# Patient Record
Sex: Male | Born: 1975 | Race: White | Hispanic: No | Marital: Married | Smoking: Current every day smoker
Health system: Southern US, Community
[De-identification: ages and names within clinical notes are randomized; demographics above are authoritative.]

## PROBLEM LIST (undated history)

## (undated) DIAGNOSIS — I350 Nonrheumatic aortic (valve) stenosis: Secondary | ICD-10-CM

## (undated) DIAGNOSIS — F419 Anxiety disorder, unspecified: Secondary | ICD-10-CM

## (undated) DIAGNOSIS — K859 Acute pancreatitis without necrosis or infection, unspecified: Secondary | ICD-10-CM

## (undated) DIAGNOSIS — R011 Cardiac murmur, unspecified: Secondary | ICD-10-CM

## (undated) HISTORY — PX: FINGER SURGERY: SHX640

## (undated) HISTORY — PX: CARDIAC SURGERY: SHX584

## (undated) HISTORY — PX: AORTIC VALVE REPAIR: SHX6306

## (undated) HISTORY — PX: MECHANICAL AORTIC VALVE REPLACEMENT: SHX2013

## (undated) HISTORY — DX: Acute pancreatitis without necrosis or infection, unspecified: K85.90

## (undated) HISTORY — DX: Anxiety disorder, unspecified: F41.9

---

## 2006-09-18 ENCOUNTER — Emergency Department: Payer: Self-pay | Admitting: Unknown Physician Specialty

## 2008-04-19 ENCOUNTER — Emergency Department: Payer: Self-pay | Admitting: Emergency Medicine

## 2008-05-22 ENCOUNTER — Emergency Department: Payer: Self-pay | Admitting: Emergency Medicine

## 2008-06-13 ENCOUNTER — Emergency Department: Payer: Self-pay | Admitting: Emergency Medicine

## 2009-06-29 ENCOUNTER — Emergency Department: Payer: Self-pay | Admitting: Emergency Medicine

## 2012-01-07 ENCOUNTER — Emergency Department: Payer: Self-pay | Admitting: Emergency Medicine

## 2012-01-07 LAB — URINALYSIS, COMPLETE
Nitrite: NEGATIVE
Protein: 30
RBC,UR: 2334 /HPF (ref 0–5)
Specific Gravity: 1.021 (ref 1.003–1.030)
WBC UR: 2 /HPF (ref 0–5)

## 2012-01-07 LAB — CBC WITH DIFFERENTIAL/PLATELET
Basophil #: 0.1 10*3/uL (ref 0.0–0.1)
Eosinophil #: 0.3 10*3/uL (ref 0.0–0.7)
Lymphocyte #: 2.9 10*3/uL (ref 1.0–3.6)
Lymphocyte %: 25.5 %
MCH: 32 pg (ref 26.0–34.0)
MCHC: 34.8 g/dL (ref 32.0–36.0)
MCV: 92 fL (ref 80–100)
Monocyte #: 0.8 x10 3/mm (ref 0.2–1.0)
Platelet: 233 10*3/uL (ref 150–440)
RDW: 13.2 % (ref 11.5–14.5)
WBC: 11.5 10*3/uL — ABNORMAL HIGH (ref 3.8–10.6)

## 2012-01-07 LAB — COMPREHENSIVE METABOLIC PANEL
Albumin: 4.2 g/dL (ref 3.4–5.0)
Alkaline Phosphatase: 120 U/L (ref 50–136)
Anion Gap: 9 (ref 7–16)
BUN: 15 mg/dL (ref 7–18)
Calcium, Total: 9.3 mg/dL (ref 8.5–10.1)
Chloride: 106 mmol/L (ref 98–107)
Co2: 24 mmol/L (ref 21–32)
Creatinine: 1.07 mg/dL (ref 0.60–1.30)
EGFR (African American): >60
Glucose: 148 mg/dL — ABNORMAL HIGH (ref 65–99)
Osmolality: 281 (ref 275–301)
SGPT (ALT): 30 U/L (ref 12–78)
Sodium: 139 mmol/L (ref 136–145)
Total Protein: 7.8 g/dL (ref 6.4–8.2)

## 2013-01-01 ENCOUNTER — Emergency Department: Payer: Self-pay | Admitting: Emergency Medicine

## 2013-01-01 LAB — BASIC METABOLIC PANEL
Anion Gap: 4 — ABNORMAL LOW (ref 7–16)
BUN: 13 mg/dL (ref 7–18)
Calcium, Total: 8.9 mg/dL (ref 8.5–10.1)
Chloride: 106 mmol/L (ref 98–107)
Co2: 27 mmol/L (ref 21–32)
EGFR (African American): >60
EGFR (Non-African Amer.): >60
Glucose: 97 mg/dL (ref 65–99)
Osmolality: 274 (ref 275–301)
Potassium: 4.2 mmol/L (ref 3.5–5.1)

## 2013-01-01 LAB — CBC
HCT: 43.7 % (ref 40.0–52.0)
HGB: 15.1 g/dL (ref 13.0–18.0)
Platelet: 176 10*3/uL (ref 150–440)
RBC: 4.76 10*6/uL (ref 4.40–5.90)
RDW: 14 % (ref 11.5–14.5)

## 2013-04-17 ENCOUNTER — Emergency Department: Payer: Self-pay | Admitting: Emergency Medicine

## 2013-04-28 ENCOUNTER — Emergency Department: Payer: Self-pay | Admitting: Emergency Medicine

## 2013-05-03 ENCOUNTER — Emergency Department: Payer: Self-pay | Admitting: Emergency Medicine

## 2013-05-04 ENCOUNTER — Ambulatory Visit: Payer: Self-pay | Admitting: Orthopedic Surgery

## 2013-05-04 LAB — DRUG SCREEN, URINE
AMPHETAMINES, UR SCREEN: NEGATIVE (ref ?–1000)
BARBITURATES, UR SCREEN: POSITIVE (ref ?–200)
BENZODIAZEPINE, UR SCRN: NEGATIVE (ref ?–200)
Cannabinoid 50 Ng, Ur ~~LOC~~: POSITIVE (ref ?–50)
Cocaine Metabolite,Ur ~~LOC~~: NEGATIVE (ref ?–300)
MDMA (Ecstasy)Ur Screen: NEGATIVE (ref ?–500)
METHADONE, UR SCREEN: NEGATIVE (ref ?–300)
Opiate, Ur Screen: POSITIVE (ref ?–300)
Phencyclidine (PCP) Ur S: NEGATIVE (ref ?–25)
Tricyclic, Ur Screen: NEGATIVE (ref ?–1000)

## 2014-03-31 ENCOUNTER — Emergency Department: Payer: Self-pay | Admitting: Student

## 2014-04-01 ENCOUNTER — Emergency Department: Payer: Self-pay | Admitting: Emergency Medicine

## 2014-05-24 ENCOUNTER — Emergency Department: Payer: Self-pay | Admitting: Emergency Medicine

## 2014-08-07 NOTE — Op Note (Signed)
PATIENT NAME:  Scott PeerAYLOR, Jermayne MR#:  [id number] DATE OF BIRTH:  24-Mar-1976  DATE OF PROCEDURE:  05/04/2013  PREOPERATIVE DIAGNOSIS: Right proximal fourth metacarpal fracture, displaced right fifth carpometacarpal dislocation.   POSTOPERATIVE DIAGNOSIS: Right proximal fourth metacarpal fracture, displaced right fifth carpometacarpal dislocation.  PROCEDURE: Open reduction, internal fixation, fourth metacarpal; closed reduction, pinning, fifth carpometacarpal joint.   ANESTHESIA: General.   SURGEON: Kennedy BuckerMichael Orie Cuttino, M.D.   DESCRIPTION OF PROCEDURE: The patient was brought to the Operating Room and, after adequate anesthesia was obtained, the right arm was prepped and draped in the usual sterile fashion. After patient identification and timeout procedures were completed, the tourniquet was raised to 250 mmHg. An incision was made between the fourth and fifth metacarpals and slightly proximal to them. The subcutaneous tissue was spread, with hemostasis being achieved with electrocautery. The fracture was exposed and, with longitudinal traction to the fifth finger, the [facility] of the fifth joint was reduced. This aided in getting reduction of the fourth metacarpal.   With the fourth metacarpal fracture site exposed, a reduction clamp was applied and a five hole 1.5 mm plate from the Biomet hand fracture set was obtained, cut to five holes and applied to the dorsal aspect of the fourth metacarpal. The screw holes were filled after contouring the plate with  standard technique, drilling 1.1 mm, measuring and placing locking cortical screws. C-arm was brought in, and it appeared to be an anatomic alignment. Rotation alignment also appeared appropriate and anatomic.   Next, the fifth CMC joint was stressed under fluoroscopy and was noted to be subluxable posteriorly. Because of this, a K wire was deemed necessary, and a K wire was inserted through the base of the fifth metacarpal, into the carpus, with the  proximal end of the phalanx directed volarly. This pin was cut short and bent over. Xeroform, 4 x 4's, Webril and a volar splint were applied. Prior to this 20 mL of 0.5% Sensorcaine were infiltrated in the area of the incision and the wound was closed with 4-0 Vicryl subcutaneously, 4-0 nylon for the skin in simple interrupted manner.   ESTIMATED BLOOD LOSS: Minimal.   COMPLICATIONS: None.   SPECIMEN: None.   IMPLANT: Biomet 1.5 mm locking plate and screws.   TOURNIQUET TIME: Approximately 45 minutes at 250 mmHg.    ____________________________ Leitha SchullerMichael J. Susen Haskew, MD mjm:cg D: 05/04/2013 17:09:19 ET T: 05/05/2013 05:42:19 ET JOB#: [id number]  cc: Leitha SchullerMichael J. Aviana Shevlin, MD, <Dictator> Leitha SchullerMICHAEL J Kaizen Ibsen MD ELECTRONICALLY SIGNED 05/05/2013 9:04

## 2014-11-12 ENCOUNTER — Emergency Department
Admission: EM | Admit: 2014-11-12 | Discharge: 2014-11-12 | Disposition: A | Discharge status: Home / Self Care | Payer: Self-pay | Attending: Emergency Medicine | Admitting: Emergency Medicine

## 2014-11-12 DIAGNOSIS — S0083XA Contusion of other part of head, initial encounter: Secondary | ICD-10-CM

## 2014-11-12 DIAGNOSIS — Z87891 Personal history of nicotine dependence: Secondary | ICD-10-CM | POA: Insufficient documentation

## 2014-11-12 DIAGNOSIS — W01198A Fall on same level from slipping, tripping and stumbling with subsequent striking against other object, initial encounter: Secondary | ICD-10-CM | POA: Insufficient documentation

## 2014-11-12 DIAGNOSIS — Y9289 Other specified places as the place of occurrence of the external cause: Secondary | ICD-10-CM | POA: Insufficient documentation

## 2014-11-12 DIAGNOSIS — S0181XA Laceration without foreign body of other part of head, initial encounter: Secondary | ICD-10-CM | POA: Insufficient documentation

## 2014-11-12 DIAGNOSIS — Y9389 Activity, other specified: Secondary | ICD-10-CM | POA: Insufficient documentation

## 2014-11-12 DIAGNOSIS — Y998 Other external cause status: Secondary | ICD-10-CM | POA: Insufficient documentation

## 2014-11-12 HISTORY — DX: Cardiac murmur, unspecified: R01.1

## 2014-11-12 HISTORY — DX: Nonrheumatic aortic (valve) stenosis: I35.0

## 2014-11-12 MED ORDER — BACITRACIN ZINC 500 UNIT/GM EX OINT
1.0000 "application " | TOPICAL_OINTMENT | Freq: Two times a day (BID) | CUTANEOUS | Status: DC
  Administered 2014-11-12: 1 via TOPICAL

## 2014-11-12 MED ORDER — LIDOCAINE-EPINEPHRINE (PF) 1 %-1:200000 IJ SOLN
INTRAMUSCULAR | Status: AC
  Administered 2014-11-12: 30 mL via INTRADERMAL
  Filled 2014-11-12: qty 30

## 2014-11-12 MED ORDER — BACITRACIN ZINC 500 UNIT/GM EX OINT
TOPICAL_OINTMENT | CUTANEOUS | Status: AC
  Administered 2014-11-12: 1 via TOPICAL
  Filled 2014-11-12: qty 0.9

## 2014-11-12 MED ORDER — LIDOCAINE-EPINEPHRINE (PF) 1 %-1:200000 IJ SOLN
30.0000 mL | Freq: Once | INTRAMUSCULAR | Status: AC
  Administered 2014-11-12: 30 mL via INTRADERMAL

## 2014-11-12 NOTE — ED Notes (Addendum)
Pt states lamp fell over about an hour ago. Lamp was tall approx 6 ft and had frosted glass the fell onto top of head. Head lac and right cheek lac noted. Bleeding controlled.  Head pain 6/10. No LOC. Reports blurry vision also.

## 2014-11-12 NOTE — Discharge Instructions (Signed)
Facial Laceration A facial laceration is a cut on the face. These injuries can be painful and cause bleeding. Some cuts may need to be closed with stitches (sutures), skin adhesive strips, or wound glue. Cuts usually heal quickly but can leave a scar. It can take 1-2 years for the scar to go away completely. HOME CARE   Only take medicines as told by your doctor.  Follow your doctor's instructions for wound care. For Stitches:  Keep the cut clean and dry.  If you have a bandage (dressing), change it at least once a day. Change the bandage if it gets wet or dirty, or as told by your doctor.  Wash the cut with soap and water 2 times a day. Rinse the cut with water. Pat it dry with a clean towel.  Put a thin layer of medicated cream on the cut as told by your doctor.  You may shower after the first 24 hours. Do not soak the cut in water until the stitches are removed.  Have your stitches removed as told by your doctor.  Do not wear any makeup until a few days after your stitches are removed. For Skin Adhesive Strips:  Keep the cut clean and dry.  Do not get the strips wet. You may take a bath, but be careful to keep the cut dry.  If the cut gets wet, pat it dry with a clean towel.  The strips will fall off on their own. Do not remove the strips that are still stuck to the cut. For Wound Glue:  You may shower or take baths. Do not soak or scrub the cut. Do not swim. Avoid heavy sweating until the glue falls off on its own. After a shower or bath, pat the cut dry with a clean towel.  Do not put medicine or makeup on your cut until the glue falls off.  If you have a bandage, do not put tape over the glue.  Avoid lots of sunlight or tanning lamps until the glue falls off.  The glue will fall off on its own in 5-10 days. Do not pick at the glue. After Healing: Put sunscreen on the cut for the first year to reduce your scar. GET HELP RIGHT AWAY IF:   Your cut area gets red,  painful, or puffy (swollen).  You see a yellowish-white fluid (pus) coming from the cut.  You have chills or a fever. MAKE SURE YOU:   Understand these instructions.  Will watch your condition.  Will get help right away if you are not doing well or get worse. Document Released: 09/19/2007 Document Revised: 01/21/2013 Document Reviewed: 11/13/2012 [facility] Patient Information 2015 [place], [place]. This information is not intended to replace advice given to you by your health care provider. Make sure you discuss any questions you have with your health care provider.   Keep the wounds, clean, dry, and covered as needed.  Return in 3-5 days for suture removal.

## 2014-11-12 NOTE — ED Notes (Signed)
Instructed on suture care and to come back in 5 days for removal. Pt verbalized understanding

## 2014-11-12 NOTE — ED Provider Notes (Signed)
[facility] Emergency Department Provider Note ____________________________________________  Time seen: 1155  I have reviewed the triage vital signs and the nursing notes.  HISTORY  Chief Complaint  Head Injury  HPI Scott [name] is a 39 y.o. male reports to the ED for evaluation of injury sustained about a half ago at home. He describes that the kids knocked over a tall floor lamp, which had a frosted glass shade on it. The lamp shattered as it fell hitting the patient on the forehead, causing laceration to the top of the forehead and a superficial laceration to the lower right cheek. He denies any loss of consciousness, nausea, vomiting, or headache. He reports some resolved blurred vision. He rates his pain as 6/10 in triage.  Past Medical History  Diagnosis Date  . Aortic stenosis   . Murmur, cardiac    There are no active problems to display for this patient.  Past Surgical History  Procedure Laterality Date  . Finger surgery    . Cardiac surgery     No current outpatient prescriptions on file.  Allergies Cocoa  History reviewed. No pertinent family history.  Social History History  Substance Use Topics  . Smoking status: Former Games developer  . Smokeless tobacco: Not on file  . Alcohol Use: Yes   Review of Systems  Constitutional: Negative for fever. Eyes: Negative for visual changes. ENT: Negative for sore throat. Cardiovascular: Negative for chest pain. Respiratory: Negative for shortness of breath. Gastrointestinal: Negative for abdominal pain, vomiting and diarrhea. Genitourinary: Negative for dysuria. Musculoskeletal: Negative for back pain. Skin: Negative for rash. Neurological: Negative for headaches, focal weakness or numbness. ____________________________________________  PHYSICAL EXAM:  VITAL SIGNS: ED Triage Vitals  Enc Vitals Group     BP 11/12/14 1128 104/86 mmHg     Pulse Rate 11/12/14 1128 64     Resp 11/12/14 1128 14      Temp 11/12/14 1128 98.1 F (36.7 C)     Temp Source 11/12/14 1128 Oral     SpO2 11/12/14 1128 99 %     Weight 11/12/14 1128 175 lb (79.379 kg)     Height 11/12/14 1128 5\' 10"  (1.778 m)     Head Cir --      Peak Flow --      Pain Score 11/12/14 1128 6     Pain Loc --      Pain Edu? --      Excl. in GC? --    Constitutional: Alert and oriented. Well appearing and in no distress. Eyes: Conjunctivae are normal. PERRL. Normal extraocular movements. ENT   Head: Normocephalic and atraumatic, except for linear laceration to the forehead and right cheek.    Nose: No congestion/rhinnorhea.   Mouth/Throat: Mucous membranes are moist.   Neck: Supple. No thyromegaly. Hematological/Lymphatic/Immunilogical: No cervical lymphadenopathy. Cardiovascular: Normal rate, regular rhythm.  Respiratory: Normal respiratory effort.  Musculoskeletal: Nontender with normal range of motion in all extremities.  Neurologic:  Normal gait without ataxia. Normal speech and language. No gross focal neurologic deficits are appreciated. Skin:  Skin is warm, dry and intact. No rash noted. Psychiatric: Mood and affect are normal. Patient exhibits appropriate insight and judgment. ____________________________________________  LACERATION REPAIR Performed by: Lissa Hoard Authorized by: Lissa Hoard Consent: Verbal consent obtained. Risks and benefits: risks, benefits and alternatives were discussed Consent given by: patient Patient identity confirmed: provided demographic data Prepped and Draped in normal sterile fashion Wound explored  Laceration Location: forehead  Laceration  Length: 2 cm  No Foreign Bodies seen or palpated  Anesthesia: local infiltration  Local anesthetic: lidocaine 1% w/ epinephrine  Anesthetic total: 2 ml  Irrigation method: syringe Amount of cleaning: standard  Skin closure: 6-0 Nylon  Number of sutures: 5  Technique:  interrupted  Patient tolerance: Patient tolerated the procedure well with no immediate complications. ____________________________________________  INITIAL IMPRESSION / ASSESSMENT AND PLAN / ED COURSE  Facial contusion with laceration s/p suture repair. Superficial left cheek laceration without repair.  ____________________________________________  FINAL CLINICAL IMPRESSION(S) / ED DIAGNOSES  Final diagnoses:  Facial laceration, initial encounter  Facial contusion, initial encounter     Lissa Hoard, PA-C 11/12/14 1445

## 2014-11-12 NOTE — ED Notes (Signed)
Pt reports to ED w/ c/o of head injury.  Pt sts his kids knocked down a lamp that hit him on his head.  Pt describes loss of vision but denies LOC.  Pt sts vision is blurry.  Bleeding is controled

## 2015-03-12 ENCOUNTER — Encounter: Payer: Self-pay | Admitting: Medical Oncology

## 2015-03-12 ENCOUNTER — Emergency Department
Admission: EM | Admit: 2015-03-12 | Discharge: 2015-03-12 | Disposition: A | Discharge status: Home / Self Care | Payer: Self-pay | Attending: Emergency Medicine | Admitting: Emergency Medicine

## 2015-03-12 DIAGNOSIS — R110 Nausea: Secondary | ICD-10-CM | POA: Insufficient documentation

## 2015-03-12 DIAGNOSIS — F17200 Nicotine dependence, unspecified, uncomplicated: Secondary | ICD-10-CM | POA: Insufficient documentation

## 2015-03-12 DIAGNOSIS — R51 Headache: Secondary | ICD-10-CM | POA: Insufficient documentation

## 2015-03-12 DIAGNOSIS — R519 Headache, unspecified: Secondary | ICD-10-CM

## 2015-03-12 MED ORDER — DIPHENHYDRAMINE HCL 50 MG/ML IJ SOLN
50.0000 mg | Freq: Once | INTRAMUSCULAR | Status: AC
  Administered 2015-03-12: 50 mg via INTRAMUSCULAR
  Filled 2015-03-12: qty 1

## 2015-03-12 MED ORDER — PROMETHAZINE HCL 25 MG/ML IJ SOLN
25.0000 mg | Freq: Once | INTRAMUSCULAR | Status: AC
  Administered 2015-03-12: 25 mg via INTRAMUSCULAR
  Filled 2015-03-12: qty 1

## 2015-03-12 MED ORDER — KETOROLAC TROMETHAMINE 30 MG/ML IJ SOLN
60.0000 mg | Freq: Once | INTRAMUSCULAR | Status: AC
  Administered 2015-03-12: 60 mg via INTRAMUSCULAR
  Filled 2015-03-12: qty 2

## 2015-03-12 NOTE — ED Provider Notes (Signed)
[facility] Emergency Department Provider Note  ____________________________________________  Time seen: Approximately 1:01 PM  I have reviewed the triage vital signs and the nursing notes.   HISTORY  Chief Complaint Headache    HPI Scott [name] is a 39 y.o. male who presents to the emergency department complaining of one-week history of headache. He states that he is tried numerous over-the-counter medications to include Tylenol, ibuprofen, and Imitrex. He states that his headache will all however it does not completely resolve. He denies any visual acuity changes. He denies any numbness or tingling. He does endorse some mild nausea but no vomiting. He states the pain is constant however it waxes and wanes. Pain is an ache/pressure/pounding sensation.   Past Medical History  Diagnosis Date  . Aortic stenosis   . Murmur, cardiac     There are no active problems to display for this patient.   Past Surgical History  Procedure Laterality Date  . Finger surgery    . Cardiac surgery      No current outpatient prescriptions on file.  Allergies Cocoa  No family history on file.  Social History Social History  Substance Use Topics  . Smoking status: Current Every Day Smoker  . Smokeless tobacco: None  . Alcohol Use: Yes     Comment: occ    Review of Systems Constitutional: No fever/chills Eyes: No visual changes. ENT: No sore throat. Cardiovascular: Denies chest pain. Respiratory: Denies shortness of breath. Gastrointestinal: No abdominal pain.  No nausea, no vomiting.  No diarrhea.  No constipation. Genitourinary: Negative for dysuria. Musculoskeletal: Negative for back pain. Skin: Negative for rash. Neurological: Endorses a frontal headache but denies focal weakness or numbness.  10-point ROS otherwise negative.  ____________________________________________   PHYSICAL EXAM:  VITAL SIGNS: ED Triage Vitals  Enc Vitals Group    BP 03/12/15 1228 139/82 mmHg     Pulse Rate 03/12/15 1228 67     Resp 03/12/15 1228 18     Temp 03/12/15 1228 97.6 F (36.4 C)     Temp Source 03/12/15 1228 Oral     SpO2 03/12/15 1228 98 %     Weight 03/12/15 1228 175 lb (79.379 kg)     Height 03/12/15 1228 5\' 11"  (1.803 m)     Head Cir --      Peak Flow --      Pain Score 03/12/15 1229 6     Pain Loc --      Pain Edu? --      Excl. in GC? --     Constitutional: Alert and oriented. Well appearing and in no acute distress. Eyes: Conjunctivae are normal. PERRL. EOMI. Head: Atraumatic. Nose: No congestion/rhinnorhea. Mouth/Throat: Mucous membranes are moist.  Oropharynx non-erythematous. Neck: No stridor.   Cardiovascular: Normal rate, regular rhythm. Grossly normal heart sounds.  Good peripheral circulation. Respiratory: Normal respiratory effort.  No retractions. Lungs CTAB. Gastrointestinal: Soft and nontender. No distention. No abdominal bruits. No CVA tenderness. Musculoskeletal: No lower extremity tenderness nor edema.  No joint effusions. Neurologic:  Normal speech and language. No gross focal neurologic deficits are appreciated. No gait instability. Cranial nerves II through XII grossly intact. Skin:  Skin is warm, dry and intact. No rash noted. Psychiatric: Mood and affect are normal. Speech and behavior are normal.  ____________________________________________   LABS (all labs ordered are listed, but only abnormal results are displayed)  Labs Reviewed - No data to display ____________________________________________  EKG   ____________________________________________  RADIOLOGY  ____________________________________________   PROCEDURES  Procedure(s) performed: None  Critical Care performed: No  ____________________________________________   INITIAL IMPRESSION / ASSESSMENT AND PLAN / ED COURSE  Pertinent labs & imaging results that were available during my care of the patient were reviewed by me  and considered in my medical decision making (see chart for details).  SHEENT history, symptoms, physical exam are consistent with headache. The patient is given Toradol, Phenergan, diphenhydramine here in the emergency department with good relief. Patient will be discharged home with anti-inflammatories and instructions to continue good oral intake of fluids. Patient verbalizes understanding of diagnosis and treatment plan verbalizes compliance with same. ____________________________________________   FINAL CLINICAL IMPRESSION(S) / ED DIAGNOSES  Final diagnoses:  Acute nonintractable headache, unspecified headache type      Racheal Patches, PA-C 03/12/15 1410  Arnaldo Natal, MD 03/12/15 1459

## 2015-03-12 NOTE — ED Notes (Signed)
States he developed a frontal headache about 1 week ago..with nausea . Has tried OTC meds and his wife's imitrex w/o relief.neuro intact

## 2015-03-12 NOTE — ED Notes (Signed)
Pt reports headache since last Friday - denies hx of migraines. Pt reports wife has rx for imitrex and pt took that and it did not help. Also reports nausea.

## 2015-03-12 NOTE — Discharge Instructions (Signed)

## 2015-11-16 ENCOUNTER — Encounter: Payer: Self-pay | Admitting: Medical Oncology

## 2015-11-16 ENCOUNTER — Emergency Department
Admission: EM | Admit: 2015-11-16 | Discharge: 2015-11-16 | Disposition: A | Discharge status: Home / Self Care | Payer: Self-pay | Attending: Emergency Medicine | Admitting: Emergency Medicine

## 2015-11-16 ENCOUNTER — Emergency Department: Payer: Self-pay

## 2015-11-16 DIAGNOSIS — Y999 Unspecified external cause status: Secondary | ICD-10-CM | POA: Insufficient documentation

## 2015-11-16 DIAGNOSIS — Y929 Unspecified place or not applicable: Secondary | ICD-10-CM | POA: Insufficient documentation

## 2015-11-16 DIAGNOSIS — S60221A Contusion of right hand, initial encounter: Secondary | ICD-10-CM | POA: Insufficient documentation

## 2015-11-16 DIAGNOSIS — Y939 Activity, unspecified: Secondary | ICD-10-CM | POA: Insufficient documentation

## 2015-11-16 DIAGNOSIS — F17200 Nicotine dependence, unspecified, uncomplicated: Secondary | ICD-10-CM | POA: Insufficient documentation

## 2015-11-16 DIAGNOSIS — W228XXA Striking against or struck by other objects, initial encounter: Secondary | ICD-10-CM | POA: Insufficient documentation

## 2015-11-16 MED ORDER — OXYCODONE-ACETAMINOPHEN 5-325 MG PO TABS
1.0000 | ORAL_TABLET | Freq: Once | ORAL | Status: AC
  Administered 2015-11-16: 1 via ORAL
  Filled 2015-11-16: qty 1

## 2015-11-16 MED ORDER — TRAMADOL HCL 50 MG PO TABS: 50.0000 mg | ORAL_TABLET | Freq: Four times a day (QID) | ORAL | 0 refills | Status: AC | PRN

## 2015-11-16 MED ORDER — IBUPROFEN 600 MG PO TABS: 600.0000 mg | ORAL_TABLET | Freq: Three times a day (TID) | ORAL | 0 refills | Status: DC | PRN

## 2015-11-16 MED ORDER — IBUPROFEN 600 MG PO TABS
600.0000 mg | ORAL_TABLET | Freq: Once | ORAL | Status: AC
  Administered 2015-11-16: 600 mg via ORAL
  Filled 2015-11-16: qty 1

## 2015-11-16 NOTE — Discharge Instructions (Signed)
Wear splint for 2-3 days as needed. °

## 2015-11-16 NOTE — ED Notes (Signed)
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  Pt reports plate and screws in that hand from a previous injury.  Pt with full ROM to hand/wrist, swelling noted.  Pt in no immediate distress.  Pt does not wish to file worker's comp.

## 2015-11-16 NOTE — ED Provider Notes (Signed)
[facility] Emergency Department Provider Note   ____________________________________________   First MD Initiated Contact with Patient 11/16/15 1146     (approximate)  I have reviewed the triage vital signs and the nursing notes.   HISTORY  Chief Complaint Wrist Pain and Hand Pain    HPI Scott [name] is a 40 y.o. male patient complaining of right hand pain secondary to contusion. Patient state he was working on a vehicle when his hand slipped and hit an axle. Patient stated this pain and swelling to the dorsal aspect of the right hand. Patient's concern to 2 internal fixation from previous injury 2 years ago. She denies loss of sensation but has decreased range of motion secondary to pain.Patient rates his pain as a 7/10. No palliative measures taken for this complaint. Patient described a pain as "achy".   Past Medical History:  Diagnosis Date  . Aortic stenosis   . Murmur, cardiac     There are no active problems to display for this patient.   Past Surgical History:  Procedure Laterality Date  . CARDIAC SURGERY    . FINGER SURGERY      Prior to Admission medications   Medication Sig Start Date End Date Taking? Authorizing Provider  ibuprofen (ADVIL,MOTRIN) 600 MG tablet Take 1 tablet (600 mg total) by mouth every 8 (eight) hours as needed. 11/16/15   Joni Reining, PA-C  traMADol (ULTRAM) 50 MG tablet Take 1 tablet (50 mg total) by mouth every 6 (six) hours as needed. 11/16/15 11/15/16  Joni Reining, PA-C    Allergies Cocoa  No family history on file.  Social History Social History  Substance Use Topics  . Smoking status: Current Every Day Smoker  . Smokeless tobacco: Not on file  . Alcohol use Yes     Comment: occ    Review of Systems Constitutional: No fever/chills Eyes: No visual changes. ENT: No sore throat. Cardiovascular: Denies chest pain. Respiratory: Denies shortness of breath. Gastrointestinal: No abdominal pain.   No nausea, no vomiting.  No diarrhea.  No constipation. Genitourinary: Negative for dysuria. Musculoskeletal: Right hand pain Skin: Negative for rash. Neurological: Negative for headaches, focal weakness or numbness.    ____________________________________________   PHYSICAL EXAM:  VITAL SIGNS: ED Triage Vitals  Enc Vitals Group     BP 11/16/15 1137 108/79     Pulse Rate 11/16/15 1137 67     Resp 11/16/15 1137 18     Temp 11/16/15 1137 98.2 F (36.8 C)     Temp Source 11/16/15 1137 Oral     SpO2 11/16/15 1137 98 %     Weight 11/16/15 1137 170 lb (77.1 kg)     Height 11/16/15 1137  (1.803 m)     Head Circumference --      Peak Flow --      Pain Score 11/16/15 1135 7     Pain Loc --      Pain Edu? --      Excl. in GC? --     Constitutional: Alert and oriented. Well appearing and in no acute distress. Eyes: Conjunctivae are normal. PERRL. EOMI. Head: Atraumatic. Nose: No congestion/rhinnorhea. Mouth/Throat: Mucous membranes are moist.  Oropharynx non-erythematous. Neck: No stridor.  No cervical spine tenderness to palpation. Hematological/Lymphatic/Immunilogical: No cervical lymphadenopathy. Cardiovascular: Normal rate, regular rhythm. Grossly normal heart sounds.  Good peripheral circulation. Respiratory: Normal respiratory effort.  No retractions. Lungs CTAB. Gastrointestinal: Soft and nontender. No distention. No abdominal bruits. No  CVA tenderness. Musculoskeletal: No obvious deformity to the right hand. Marked edema. Moderate guarding palpation full. 50 metacarpal head.  Neurologic:  Normal speech and language. No gross focal neurologic deficits are appreciated. No gait instability. Skin:  Skin is warm, dry and intact. No rash noted. Psychiatric: Mood and affect are normal. Speech and behavior are normal.  ____________________________________________   LABS (all labs ordered are listed, but only abnormal results are displayed)  Labs Reviewed - No data to  display ____________________________________________  EKG   ____________________________________________  RADIOLOGY  No acute findings on x-ray of the right hand. Internal fixations are intact. ____________________________________________   PROCEDURES  Procedure(s) performed: None  Procedures  Critical Care performed: No  ____________________________________________   INITIAL IMPRESSION / ASSESSMENT AND PLAN / ED COURSE  Pertinent labs & imaging results that were available during my care of the patient were reviewed by me and considered in my medical decision making (see chart for details).  Right hand contusion. Discussed x-ray finding with patient. Patient given discharge Instructions. Patient may return to work with limitation for the 3 days. [facility]. Patient given prescription for tramadol and ibuprofen. Patient advised to wear splint. 2-3 days as needed.  Clinical Course     ____________________________________________   FINAL CLINICAL IMPRESSION(S) / ED DIAGNOSES  Final diagnoses:  Hand contusion, right, initial encounter      NEW MEDICATIONS STARTED DURING THIS VISIT:  New Prescriptions   IBUPROFEN (ADVIL,MOTRIN) 600 MG TABLET    Take 1 tablet (600 mg total) by mouth every 8 (eight) hours as needed.   TRAMADOL (ULTRAM) 50 MG TABLET    Take 1 tablet (50 mg total) by mouth every 6 (six) hours as needed.     Note:  This document was prepared using Dragon voice recognition software and may include unintentional dictation errors.    Joni Reining, PA-C 11/16/15 1317    Jennye Moccasin, MD 11/16/15 [phone]

## 2015-11-16 NOTE — ED Triage Notes (Signed)
Pt injured rt hand yesterday

## 2016-11-22 ENCOUNTER — Emergency Department: Payer: Self-pay

## 2016-11-22 ENCOUNTER — Encounter: Payer: Self-pay | Admitting: Emergency Medicine

## 2016-11-22 DIAGNOSIS — R05 Cough: Secondary | ICD-10-CM | POA: Insufficient documentation

## 2016-11-22 DIAGNOSIS — J189 Pneumonia, unspecified organism: Secondary | ICD-10-CM | POA: Insufficient documentation

## 2016-11-22 DIAGNOSIS — R011 Cardiac murmur, unspecified: Secondary | ICD-10-CM | POA: Insufficient documentation

## 2016-11-22 LAB — BASIC METABOLIC PANEL
Anion gap: 8 (ref 5–15)
BUN: 18 mg/dL (ref 6–20)
CO2: 28 mmol/L (ref 22–32)
CREATININE: 1.04 mg/dL (ref 0.61–1.24)
Calcium: 9.4 mg/dL (ref 8.9–10.3)
Chloride: 106 mmol/L (ref 101–111)
GFR calc Af Amer: >60 mL/min (ref >60)
Glucose, Bld: 95 mg/dL (ref 65–99)
Potassium: 4 mmol/L (ref 3.5–5.1)
SODIUM: 142 mmol/L (ref 135–145)

## 2016-11-22 LAB — CBC
HCT: 43.3 % (ref 40.0–52.0)
Hemoglobin: 14.7 g/dL (ref 13.0–18.0)
MCH: 30.9 pg (ref 26.0–34.0)
MCHC: 34.1 g/dL (ref 32.0–36.0)
MCV: 90.5 fL (ref 80.0–100.0)
Platelets: 224 10*3/uL (ref 150–440)
RBC: 4.78 MIL/uL (ref 4.40–5.90)
RDW: 13.7 % (ref 11.5–14.5)
WBC: 11.3 10*3/uL — AB (ref 3.8–10.6)

## 2016-11-22 LAB — TROPONIN I: Troponin I: <0.03 ng/mL (ref <0.03)

## 2016-11-22 NOTE — ED Triage Notes (Addendum)
Patient ambulatory to triage with steady gait, without difficulty or distress noted; pt reports pain to right lateral ribcage while walking in [employer]; st pain increases with deep breathing; denies any hx of same; pt also st week hx of prod cough brown sputum

## 2016-11-23 ENCOUNTER — Emergency Department
Admission: EM | Admit: 2016-11-23 | Discharge: 2016-11-23 | Disposition: A | Discharge status: Home / Self Care | Payer: Self-pay | Attending: Emergency Medicine | Admitting: Emergency Medicine

## 2016-11-23 DIAGNOSIS — R011 Cardiac murmur, unspecified: Secondary | ICD-10-CM

## 2016-11-23 DIAGNOSIS — J189 Pneumonia, unspecified organism: Secondary | ICD-10-CM

## 2016-11-23 MED ORDER — AZITHROMYCIN 250 MG PO TABS: ORAL_TABLET | ORAL | 0 refills | Status: AC

## 2016-11-23 MED ORDER — KETOROLAC TROMETHAMINE 10 MG PO TABS
10.0000 mg | ORAL_TABLET | Freq: Once | ORAL | Status: AC
Start: 2016-11-23 — End: 2016-11-23
  Administered 2016-11-23: 10 mg via ORAL
  Filled 2016-11-23: qty 1

## 2016-11-23 MED ORDER — AZITHROMYCIN 500 MG PO TABS
ORAL_TABLET | ORAL | Status: DC
Start: 2016-11-23 — End: 2016-11-23
  Filled 2016-11-23: qty 1

## 2016-11-23 MED ORDER — AZITHROMYCIN 500 MG PO TABS
500.0000 mg | ORAL_TABLET | Freq: Every day | ORAL | Status: DC
Start: 2016-11-23 — End: 2016-11-23
  Administered 2016-11-23: 500 mg via ORAL

## 2016-11-23 NOTE — ED Notes (Signed)
ED discharge signature pad not responding, pt signed hard copy and placed on chart.

## 2016-11-23 NOTE — ED Provider Notes (Signed)
[facility] Emergency Department Provider Note    First MD Initiated Contact with Patient 11/23/16 0159     (approximate)  I have reviewed the triage vital signs and the nursing notes.   HISTORY  Chief Complaint Chest Pain    HPI Scott [name] is a 41 y.o. male with below list of chronic medical conditions presents to the emergency department with one-week history of cough with Scott [name] sputum.patient also admits to nasal congestion. [facility] had acute onset of right lateral rib age discomfort. Patient denies any fever  afebrile on presentationwith a temperature of 98.4.   Past Medical History:  Diagnosis Date  . Aortic stenosis   . Murmur, cardiac     There are no active problems to display for this patient.   Past Surgical History:  Procedure Laterality Date  . CARDIAC SURGERY    . FINGER SURGERY      Prior to Admission medications   Medication Sig Start Date End Date Taking? Authorizing Provider  ibuprofen (ADVIL,MOTRIN) 600 MG tablet Take 1 tablet (600 mg total) by mouth every 8 (eight) hours as needed. 11/16/15   Joni ReiningSmith, Ronald K, PA-C    Allergies Cocoa  No family history on file.  Social History Social History  Substance Use Topics  . Smoking status: Current Every Day Smoker  . Smokeless tobacco: Never Used  . Alcohol use Yes     Comment: occ    Review of Systems Constitutional: No fever/chills Eyes: No visual changes. ENT: No sore throat. Cardiovascular: positive for right lateral chest pain Respiratory: Denies shortness of breath.positive for cough Gastrointestinal: No abdominal pain.  No nausea, no vomiting.  No diarrhea.  No constipation. Genitourinary: Negative for dysuria. Musculoskeletal: Negative for neck pain.  Negative for back pain. Integumentary: Negative for rash. Neurological: Negative for headaches, focal weakness or  numbness.   ____________________________________________   PHYSICAL EXAM:  VITAL SIGNS: ED Triage Vitals  Enc Vitals Group     BP 11/22/16 2250 124/65     Pulse Rate 11/22/16 2250 70     Resp 11/22/16 2250 18     Temp 11/22/16 2250 98.4 F (36.9 C)     Temp Source 11/22/16 2250 Oral     SpO2 11/22/16 2250 97 %     Weight 11/22/16 2251 74.8 kg (165 lb)     Height 11/22/16 2251 1.778 m (5\' 10" )     Head Circumference --      Peak Flow --      Pain Score 11/22/16 2250 7     Pain Loc --      Pain Edu? --      Excl. in GC? --     Constitutional: Alert and oriented. Well appearing and in no acute distress. Eyes: Conjunctivae are normal. Head: Atraumatic. Mouth/Throat: Mucous membranes are moist. Neck: No stridor. Cardiovascular: Normal rate, regular rhythm. Good peripheral circulation. Grossly normal heart sounds. Respiratory: Normal respiratory effort.  No retractions.right lower lobe rhonchi noted on auscultation. Gastrointestinal: Soft and nontender. No distention.  Musculoskeletal: No lower extremity tenderness nor edema. No gross deformities of extremities. Neurologic:  Normal speech and language. No gross focal neurologic deficits are appreciated.  Skin:  Skin is warm, dry and intact. No rash noted. Psychiatric: Mood and affect are normal. Speech and behavior are normal.  ____________________________________________   LABS (all labs ordered are listed, but only abnormal results are displayed)  Labs Reviewed  CBC - Abnormal; Notable  for the following:       Result Value   WBC 11.3 (*)    All other components within normal limits  BASIC METABOLIC PANEL  TROPONIN I    RADIOLOGY I, [place] N Scott [name], personally viewed and evaluated these images (plain radiographs) as part of my medical decision making, as well as reviewing the written report by the radiologist.  Dg Chest 2 View  Result Date: 11/22/2016 CLINICAL DATA:  Acute onset of right lateral rib pain.  Initial encounter. EXAM: CHEST  2 VIEW COMPARISON:  Chest radiograph performed 01/01/2013 FINDINGS: The lungs are well-aerated. Mild vascular congestion is noted. Mild left basilar opacity likely reflects atelectasis. There is no evidence of pleural effusion or pneumothorax. The heart is normal in size; the mediastinal contour is within normal limits. No acute osseous abnormalities are seen. IMPRESSION: Mild vascular congestion. Mild left basilar opacity likely reflects atelectasis. No displaced rib fracture seen. Electronically Signed   By: Roanna Raider M.D.   On: 11/22/2016 23:13     Procedures   ____________________________________________   INITIAL IMPRESSION / ASSESSMENT AND PLAN / ED COURSE  Pertinent labs & imaging results that were available during my care of the patient were reviewed by me and considered in my medical decision making (see chart for details).        ____________________________________________  FINAL CLINICAL IMPRESSION(S) / ED DIAGNOSES  Final diagnoses:  Community acquired pneumonia, unspecified laterality  Murmur, cardiac     MEDICATIONS GIVEN DURING THIS VISIT:  Medications  azithromycin (ZITHROMAX) tablet 500 mg (500 mg Oral Given 11/23/16 0308)  ketorolac (TORADOL) tablet 10 mg (10 mg Oral Given 11/23/16 0308)     NEW OUTPATIENT MEDICATIONS STARTED DURING THIS VISIT:  New Prescriptions   No medications on file    Modified Medications   No medications on file    Discontinued Medications   No medications on file     Note:  This document was prepared using Dragon voice recognition software and may include unintentional dictation errors.    Darci Current, MD 11/23/16 [phone]

## 2016-11-23 NOTE — ED Notes (Signed)

## 2016-11-26 ENCOUNTER — Emergency Department: Payer: Self-pay

## 2016-11-26 ENCOUNTER — Emergency Department
Admission: EM | Admit: 2016-11-26 | Discharge: 2016-11-26 | Disposition: A | Discharge status: Home / Self Care | Payer: Self-pay | Attending: Emergency Medicine | Admitting: Emergency Medicine

## 2016-11-26 DIAGNOSIS — F17200 Nicotine dependence, unspecified, uncomplicated: Secondary | ICD-10-CM | POA: Insufficient documentation

## 2016-11-26 DIAGNOSIS — K859 Acute pancreatitis without necrosis or infection, unspecified: Secondary | ICD-10-CM | POA: Insufficient documentation

## 2016-11-26 LAB — CBC WITH DIFFERENTIAL/PLATELET
BASOS ABS: 0.1 10*3/uL (ref 0–0.1)
BASOS PCT: 1 %
EOS ABS: 0.3 10*3/uL (ref 0–0.7)
Eosinophils Relative: 4 %
HCT: 42 % (ref 40.0–52.0)
HEMOGLOBIN: 14.3 g/dL (ref 13.0–18.0)
Lymphocytes Relative: 32 %
Lymphs Abs: 2.1 10*3/uL (ref 1.0–3.6)
MCH: 30.8 pg (ref 26.0–34.0)
MCHC: 34.1 g/dL (ref 32.0–36.0)
MCV: 90.3 fL (ref 80.0–100.0)
Monocytes Absolute: 0.7 10*3/uL (ref 0.2–1.0)
Monocytes Relative: 11 %
Neutro Abs: 3.4 10*3/uL (ref 1.4–6.5)
Neutrophils Relative %: 52 %
Platelets: 205 10*3/uL (ref 150–440)
RBC: 4.65 MIL/uL (ref 4.40–5.90)
RDW: 13.5 % (ref 11.5–14.5)
WBC: 6.6 10*3/uL (ref 3.8–10.6)

## 2016-11-26 LAB — HEPATIC FUNCTION PANEL
ALBUMIN: 3.9 g/dL (ref 3.5–5.0)
ALK PHOS: 93 U/L (ref 38–126)
ALT: 18 U/L (ref 17–63)
AST: 21 U/L (ref 15–41)
BILIRUBIN TOTAL: 0.5 mg/dL (ref 0.3–1.2)
Bilirubin, Direct: <0.1 mg/dL — ABNORMAL LOW (ref 0.1–0.5)
Total Protein: 6.8 g/dL (ref 6.5–8.1)

## 2016-11-26 LAB — LIPASE, BLOOD: Lipase: 129 U/L — ABNORMAL HIGH (ref 11–51)

## 2016-11-26 LAB — BASIC METABOLIC PANEL
Anion gap: 8 (ref 5–15)
BUN: 20 mg/dL (ref 6–20)
CHLORIDE: 110 mmol/L (ref 101–111)
CO2: 23 mmol/L (ref 22–32)
CREATININE: 1.02 mg/dL (ref 0.61–1.24)
Calcium: 9 mg/dL (ref 8.9–10.3)
GFR calc Af Amer: >60 mL/min (ref >60)
GFR calc non Af Amer: >60 mL/min (ref >60)
Glucose, Bld: 102 mg/dL — ABNORMAL HIGH (ref 65–99)
POTASSIUM: 4.3 mmol/L (ref 3.5–5.1)
SODIUM: 141 mmol/L (ref 135–145)

## 2016-11-26 LAB — BRAIN NATRIURETIC PEPTIDE: B Natriuretic Peptide: 40 pg/mL (ref 0.0–100.0)

## 2016-11-26 LAB — TROPONIN I

## 2016-11-26 MED ORDER — IOPAMIDOL (ISOVUE-370) INJECTION 76%
75.0000 mL | Freq: Once | INTRAVENOUS | Status: AC | PRN
  Administered 2016-11-26: 75 mL via INTRAVENOUS

## 2016-11-26 MED ORDER — ONDANSETRON HCL 4 MG PO TABS: 4.0000 mg | ORAL_TABLET | Freq: Three times a day (TID) | ORAL | 0 refills | Status: DC | PRN

## 2016-11-26 MED ORDER — OXYCODONE HCL 5 MG PO TABS
5.0000 mg | ORAL_TABLET | Freq: Once | ORAL | Status: AC
  Administered 2016-11-26: 5 mg via ORAL
  Filled 2016-11-26: qty 1

## 2016-11-26 MED ORDER — ACETAMINOPHEN 500 MG PO TABS
1000.0000 mg | ORAL_TABLET | Freq: Once | ORAL | Status: AC
  Administered 2016-11-26: 1000 mg via ORAL
  Filled 2016-11-26: qty 2

## 2016-11-26 MED ORDER — ONDANSETRON HCL 4 MG/2ML IJ SOLN
4.0000 mg | Freq: Once | INTRAMUSCULAR | Status: AC
  Administered 2016-11-26: 4 mg via INTRAVENOUS
  Filled 2016-11-26: qty 2

## 2016-11-26 MED ORDER — KETOROLAC TROMETHAMINE 30 MG/ML IJ SOLN
15.0000 mg | Freq: Once | INTRAMUSCULAR | Status: AC
  Administered 2016-11-26: 15 mg via INTRAVENOUS
  Filled 2016-11-26: qty 1

## 2016-11-26 MED ORDER — SODIUM CHLORIDE 0.9 % IV BOLUS (SEPSIS)
1000.0000 mL | Freq: Once | INTRAVENOUS | Status: AC
  Administered 2016-11-26: 1000 mL via INTRAVENOUS

## 2016-11-26 MED ORDER — OXYCODONE-ACETAMINOPHEN 5-325 MG PO TABS: 1.0000 | ORAL_TABLET | Freq: Four times a day (QID) | ORAL | 0 refills | Status: DC | PRN

## 2016-11-26 NOTE — ED Triage Notes (Signed)
Pt dx with PNE last week and was given rx for same with not improvement.

## 2016-11-26 NOTE — ED Notes (Signed)
Patient transported to Ultrasound 

## 2016-11-26 NOTE — ED Provider Notes (Signed)
[facility] Emergency Department Provider Note  ____________________________________________  Time seen: Approximately 8:50 AM  I have reviewed the triage vital signs and the nursing notes.   HISTORY  Chief Complaint Shortness of Breath   HPI Scott [name] is a 41 y.o. male with h/o AS who presents for evaluation of R sided CP and SOB. Patient was seen here week ago for similar complaints. Had a chest x-ray showing mild vascular congestion with atelectasis. Clinically patient was thought to have pneumonia and was started on a Z-Pak. He continues to have unchanged symptoms. Is complaining of 6 out of 10 right-sided sharp pleuritic chest pain radiating to his back associated with shortness of breath. Patient is a smoker. Has had a cough for the last week productive of dark brown/black sputum with blood streaks. No fever or chills. No personal or family history blood clots, no recent travel or immobilization, no leg pain or swelling, no hormones. Patient reports that he finished his Z-Pak with no improvement. He reports intermittent episodes of lightheadedness that he describes as feeling like is going to pass out.  Past Medical History:  Diagnosis Date  . Aortic stenosis   . Murmur, cardiac     There are no active problems to display for this patient.   Past Surgical History:  Procedure Laterality Date  . CARDIAC SURGERY    . FINGER SURGERY      Prior to Admission medications   Medication Sig Start Date End Date Taking? Authorizing Provider  azithromycin (ZITHROMAX Z-PAK) 250 MG tablet Take 2 tablets (500 mg) on  Day 1,  followed by 1 tablet (250 mg) once daily on Days 2 through 5. 11/23/16 11/28/16 Yes Darci CurrentBrown, [place] N, MD  ibuprofen (ADVIL,MOTRIN) 600 MG tablet Take 1 tablet (600 mg total) by mouth every 8 (eight) hours as needed. 11/16/15  Yes Joni ReiningSmith, Ronald K, PA-C  ondansetron (ZOFRAN) 4 MG tablet Take 1 tablet (4 mg total) by mouth every 8 (eight)  hours as needed for nausea or vomiting. 11/26/16   Don PerkingVeronese, [place], MD  oxyCODONE-acetaminophen (ROXICET) 5-325 MG tablet Take 1 tablet by mouth every 6 (six) hours as needed. 11/26/16 11/26/17  Nita SickleVeronese, [place], MD    Allergies Cocoa  No family history on file.  Social History Social History  Substance Use Topics  . Smoking status: Current Every Day Smoker  . Smokeless tobacco: Never Used  . Alcohol use Yes     Comment: occ    Review of Systems  Constitutional: Negative for fever. Eyes: Negative for visual changes. ENT: Negative for sore throat. Neck: No neck pain  Cardiovascular: + chest pain. Respiratory: + shortness of breath, cough, hemoptysis Gastrointestinal: Negative for abdominal pain, vomiting or diarrhea. Genitourinary: Negative for dysuria. Musculoskeletal: Negative for back pain. Skin: Negative for rash. Neurological: Negative for headaches, weakness or numbness. Psych: No SI or HI  ____________________________________________   PHYSICAL EXAM:  VITAL SIGNS: ED Triage Vitals [11/26/16 0823]  Enc Vitals Group     BP 115/68     Pulse Rate 70     Resp 18     Temp 98 F (36.7 C)     Temp Source Oral     SpO2 96 %     Weight      Height      Head Circumference      Peak Flow      Pain Score 7     Pain Loc      Pain Edu?  Excl. in GC?     Constitutional: Alert and oriented. Well appearing and in no apparent distress. HEENT:      Head: Normocephalic and atraumatic.         Eyes: Conjunctivae are normal. Sclera is non-icteric.       Mouth/Throat: Mucous membranes are moist.       Neck: Supple with no signs of meningismus. Cardiovascular: Regular rate and rhythm. 2+ symmetrical distal pulses are present in all extremities. No JVD. Respiratory: Normal respiratory effort. Lungs are clear to auscultation bilaterally. No wheezes, crackles, or rhonchi.  Gastrointestinal: Soft, non tender, and non distended with positive bowel sounds. No rebound or  guarding. Musculoskeletal: Nontender with normal range of motion in all extremities. No edema, cyanosis, or erythema of extremities. Neurologic: Normal speech and language. Face is symmetric. Moving all extremities. No gross focal neurologic deficits are appreciated. Skin: Skin is warm, dry and intact. No rash noted. Psychiatric: Mood and affect are normal. Speech and behavior are normal.  ____________________________________________   LABS (all labs ordered are listed, but only abnormal results are displayed)  Labs Reviewed  BASIC METABOLIC PANEL - Abnormal; Notable for the following:       Result Value   Glucose, Bld 102 (*)    All other components within normal limits  HEPATIC FUNCTION PANEL - Abnormal; Notable for the following:    Bilirubin, Direct <0.1 (*)    All other components within normal limits  LIPASE, BLOOD - Abnormal; Notable for the following:    Lipase 129 (*)    All other components within normal limits  CBC WITH DIFFERENTIAL/PLATELET  TROPONIN I  BRAIN NATRIURETIC PEPTIDE   ____________________________________________  EKG  ED ECG REPORT I, Nita Sickle, the attending physician, personally viewed and interpreted this ECG.  Sinus bradycardia, rate of 56, normal intervals, left axis deviation, left anterior fascicular block, no ST elevations or depressions. T-wave inversions in lead 3 and aVF. Unchanged from prior from 3 days ago. ____________________________________________  RADIOLOGY  CXR: No active cardiopulmonary disease  CTA chest:  1. No filling defect is identified in the pulmonary arterial tree to suggest pulmonary embolus. 2. Cardiomegaly. 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 3. Indistinct bilateral hilar and infrahilar adenopathy is mild. No other specific findings to suggest sarcoidosis. The cause of this mild  hilar adenopathy is uncertain. It may well be reactive. 4. Bandlike density in the right lower lobe favoring atelectasis or scarring. Lesser degree of subsegmental atelectasis or scarring in the left lower lobe. 5. 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  RUQ [place]; negative ____________________________________________   PROCEDURES  Procedure(s) performed: None Procedures Critical Care performed:  None ____________________________________________   INITIAL IMPRESSION / ASSESSMENT AND PLAN / ED COURSE  41 y.o. male with h/o AS who presents for evaluation of R sided CP and SOB x 1 week. Patient with pleuritic CP and hemoptysis. Ddx bronchitis, PNA, PE. CXR negative for PNA. Will pursue CTA of the chest To rule out PE. Do not suspect intra-abdominal etiology since patient has no tenderness on his abdomen however we'll check LFTs and lipase. We'll give Toradol for the pain.    _________________________ 12:28 PM on 11/26/2016 -----------------------------------------  CTA with no evidence of PE. 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 Patient has an appointment  with her cardiologist in 2 days. Labs consistent with pancreatitis. Patient denies alcohol abuse. RUQ [place] negative for gallstones. Pain well controlled with by mouth meds. Patient is tolerating by mouth. Patient can be discharged home on slow diet advancement, Zofran, Percocet, and follow up with primary care doctor.  Pertinent labs & imaging results that were available during my care of the patient were reviewed by me and considered in my medical decision making (see chart for details).    ____________________________________________   FINAL CLINICAL IMPRESSION(S) / ED DIAGNOSES  Final diagnoses:  Pancreatitis      NEW MEDICATIONS STARTED DURING THIS VISIT:  New  Prescriptions   ONDANSETRON (ZOFRAN) 4 MG TABLET    Take 1 tablet (4 mg total) by mouth every 8 (eight) hours as needed for nausea or vomiting.   OXYCODONE-ACETAMINOPHEN (ROXICET) 5-325 MG TABLET    Take 1 tablet by mouth every 6 (six) hours as needed.     Note:  This document was prepared using Dragon voice recognition software and may include unintentional dictation errors.    Nita Sickle, MD 11/26/16 1229

## 2016-11-26 NOTE — ED Notes (Signed)
Patient transported to X-ray 

## 2017-01-13 ENCOUNTER — Emergency Department
Admission: EM | Admit: 2017-01-13 | Discharge: 2017-01-13 | Disposition: A | Discharge status: Home / Self Care | Payer: Self-pay | Attending: Emergency Medicine | Admitting: Emergency Medicine

## 2017-01-13 ENCOUNTER — Encounter: Payer: Self-pay | Admitting: Emergency Medicine

## 2017-01-13 ENCOUNTER — Emergency Department: Payer: Self-pay

## 2017-01-13 DIAGNOSIS — F1410 Cocaine abuse, uncomplicated: Secondary | ICD-10-CM

## 2017-01-13 DIAGNOSIS — R109 Unspecified abdominal pain: Secondary | ICD-10-CM

## 2017-01-13 DIAGNOSIS — K859 Acute pancreatitis without necrosis or infection, unspecified: Secondary | ICD-10-CM

## 2017-01-13 DIAGNOSIS — R011 Cardiac murmur, unspecified: Secondary | ICD-10-CM | POA: Insufficient documentation

## 2017-01-13 DIAGNOSIS — F17200 Nicotine dependence, unspecified, uncomplicated: Secondary | ICD-10-CM | POA: Insufficient documentation

## 2017-01-13 LAB — COMPREHENSIVE METABOLIC PANEL
ALT: 14 U/L — ABNORMAL LOW (ref 17–63)
AST: 22 U/L (ref 15–41)
Albumin: 4 g/dL (ref 3.5–5.0)
Alkaline Phosphatase: 95 U/L (ref 38–126)
Anion gap: 8 (ref 5–15)
BUN: 22 mg/dL — AB (ref 6–20)
CHLORIDE: 107 mmol/L (ref 101–111)
CO2: 26 mmol/L (ref 22–32)
Calcium: 9.3 mg/dL (ref 8.9–10.3)
Creatinine, Ser: 1.02 mg/dL (ref 0.61–1.24)
GFR calc Af Amer: >60 mL/min (ref >60)
Glucose, Bld: 108 mg/dL — ABNORMAL HIGH (ref 65–99)
POTASSIUM: 3.8 mmol/L (ref 3.5–5.1)
Sodium: 141 mmol/L (ref 135–145)
Total Bilirubin: 0.5 mg/dL (ref 0.3–1.2)
Total Protein: 7.1 g/dL (ref 6.5–8.1)

## 2017-01-13 LAB — URINE DRUG SCREEN, QUALITATIVE (ARMC ONLY)
Amphetamines, Ur Screen: NOT DETECTED
BENZODIAZEPINE, UR SCRN: NOT DETECTED
Barbiturates, Ur Screen: NOT DETECTED
CANNABINOID 50 NG, UR ~~LOC~~: POSITIVE — AB
Cocaine Metabolite,Ur ~~LOC~~: POSITIVE — AB
MDMA (ECSTASY) UR SCREEN: NOT DETECTED
Methadone Scn, Ur: NOT DETECTED
OPIATE, UR SCREEN: NOT DETECTED
PHENCYCLIDINE (PCP) UR S: NOT DETECTED
Tricyclic, Ur Screen: NOT DETECTED

## 2017-01-13 LAB — URINALYSIS, COMPLETE (UACMP) WITH MICROSCOPIC
BACTERIA UA: NONE SEEN
Bilirubin Urine: NEGATIVE
GLUCOSE, UA: NEGATIVE mg/dL
HGB URINE DIPSTICK: NEGATIVE
KETONES UR: NEGATIVE mg/dL
LEUKOCYTES UA: NEGATIVE
NITRITE: NEGATIVE
PH: 5 (ref 5.0–8.0)
PROTEIN: NEGATIVE mg/dL
Specific Gravity, Urine: 1.023 (ref 1.005–1.030)
Squamous Epithelial / LPF: NONE SEEN

## 2017-01-13 LAB — CBC WITH DIFFERENTIAL/PLATELET
BASOS ABS: 0.1 10*3/uL (ref 0–0.1)
Basophils Relative: 1 %
Eosinophils Absolute: 0.4 10*3/uL (ref 0–0.7)
Eosinophils Relative: 5 %
HEMATOCRIT: 43 % (ref 40.0–52.0)
HEMOGLOBIN: 14.7 g/dL (ref 13.0–18.0)
LYMPHS PCT: 38 %
Lymphs Abs: 3.2 10*3/uL (ref 1.0–3.6)
MCH: 30.9 pg (ref 26.0–34.0)
MCHC: 34.2 g/dL (ref 32.0–36.0)
MCV: 90.5 fL (ref 80.0–100.0)
MONO ABS: 0.8 10*3/uL (ref 0.2–1.0)
Monocytes Relative: 9 %
NEUTROS ABS: 3.8 10*3/uL (ref 1.4–6.5)
NEUTROS PCT: 47 %
Platelets: 215 10*3/uL (ref 150–440)
RBC: 4.74 MIL/uL (ref 4.40–5.90)
RDW: 13.3 % (ref 11.5–14.5)
WBC: 8.3 10*3/uL (ref 3.8–10.6)

## 2017-01-13 LAB — ETHANOL: Alcohol, Ethyl (B): <10 mg/dL (ref <10)

## 2017-01-13 LAB — LIPASE, BLOOD: LIPASE: 111 U/L — AB (ref 11–51)

## 2017-01-13 MED ORDER — IOPAMIDOL (ISOVUE-300) INJECTION 61%
30.0000 mL | Freq: Once | INTRAVENOUS | Status: AC | PRN
  Administered 2017-01-13: 30 mL via ORAL

## 2017-01-13 MED ORDER — IOPAMIDOL (ISOVUE-300) INJECTION 61%
100.0000 mL | Freq: Once | INTRAVENOUS | Status: AC | PRN
Start: 2017-01-13 — End: 2017-01-13
  Administered 2017-01-13: 100 mL via INTRAVENOUS

## 2017-01-13 NOTE — ED Triage Notes (Signed)
Patient c/o RUQ abd pain for 2.5 months, nausea, poor PO intake.

## 2017-01-13 NOTE — ED Notes (Signed)
Patient is still unable to void 

## 2017-01-13 NOTE — ED Notes (Signed)
Patient has finished oral contrast 

## 2017-01-13 NOTE — ED Notes (Signed)
Patient has oral contrast at bedside.

## 2017-01-13 NOTE — ED Provider Notes (Signed)
[facility] Provider [facility]  ____________________________________________   I have reviewed the triage vital signs and the nursing notes.   HISTORY  Chief Complaint Abdominal Pain    HPI Scott [name] is a 41 y.o. male  Who has a history in the past of alcohol abuse but states he does not drink alcohol very much anymore, states that he had "one drink" of rum and Coke 2 days ago and since that time he has had epigastric abdominal and right upper quadrant abdominal pain. Patient had a recent CT scan which showed a normal liver and a recent negative right upper quadrant abdominal ultrasound. Patient has a history of mild pancreatitis in the past. He states that he has been having off-and-on pain in his abdomen under similar circumstances for the last several months. He denies any fever or chills. He states he is losing some weight. He denies any chest pain or shortness of breath. This is abdominal pain. Sometimes made worse with food, also made worse withrum and Coke. He denies any melena or bright red blood per rectum he has had some loose stools. He sometimes vomits he vomited some time a week and a half ago but not since. He denies any hematemesis.    Past Medical History:  Diagnosis Date  . Aortic stenosis   . Murmur, cardiac     There are no active problems to display for this patient.   Past Surgical History:  Procedure Laterality Date  . CARDIAC SURGERY    . FINGER SURGERY      Prior to Admission medications   Medication Sig Start Date End Date Taking? Authorizing Provider  ibuprofen (ADVIL,MOTRIN) 600 MG tablet Take 1 tablet (600 mg total) by mouth every 8 (eight) hours as needed. 11/16/15   Joni Reining, PA-C  ondansetron (ZOFRAN) 4 MG tablet Take 1 tablet (4 mg total) by mouth every 8 (eight) hours as needed for nausea or vomiting. 11/26/16   Don Perking, [place], MD  oxyCODONE-acetaminophen (ROXICET) 5-325 MG tablet  Take 1 tablet by mouth every 6 (six) hours as needed. 11/26/16 11/26/17  Nita Sickle, MD    Allergies Cocoa  History reviewed. No pertinent family history.  Social History Social History  Substance Use Topics  . Smoking status: Current Every Day Smoker  . Smokeless tobacco: Never Used  . Alcohol use Yes     Comment: occ    Review of Systems Constitutional: No fever/chills Eyes: No visual changes. ENT: No sore throat. No stiff neck no neck pain Cardiovascular: Denies chest pain. Respiratory: Denies shortness of breath. Gastrointestinal:   CHPIGenitourinary: Negative for dysuria. Musculoskeletal: Negative lower extremity swelling Skin: Negative for rash. Neurological: Negative for severe headaches, focal weakness or numbness.   ____________________________________________   PHYSICAL EXAM:  VITAL SIGNS: ED Triage Vitals  Enc Vitals Group     BP 01/13/17 1144 124/70     Pulse Rate 01/13/17 1144 (!) 59     Resp 01/13/17 1144 16     Temp 01/13/17 1144 97.8 F (36.6 C)     Temp Source 01/13/17 1144 Oral     SpO2 01/13/17 1144 99 %     Weight 01/13/17 1138 165 lb (74.8 kg)     Height 01/13/17 1138  (1.778 m)     Head Circumference --      Peak Flow --      Pain Score 01/13/17 1138 7     Pain Loc --  Pain Edu? --      Excl. in GC? --     Constitutional: Alert and oriented. Well appearing and in no acute distress. Eyes: Conjunctivae are normal Head: Atraumatic HEENT: No congestion/rhinnorhea. Mucous membranes are moist.  Oropharynx non-erythematous Neck:   Nontender with no meningismus, no masses, no stridor Cardiovascular: Normal rate, regular rhythm. Grossly normal heart sounds.  Good peripheral circulation. Respiratory: Normal respiratory effort.  No retractions. Lungs CTAB. Abdominal: Soft and nontender. No distention. No guarding no rebound Back:  There is no focal tenderness or step off.  there is no midline tenderness there are no lesions  noted. there is no CVA tenderness Musculoskeletal: No lower extremity tenderness, no upper extremity tenderness. No joint effusions, no DVT signs strong distal pulses no edema Neurologic:  Normal speech and language. No gross focal neurologic deficits are appreciated.  Skin:  Skin is warm, dry and intact. No rash noted. Psychiatric: Mood and affect are normal. Speech and behavior are normal.  ____________________________________________   LABS (all labs ordered are listed, but only abnormal results are displayed)  Labs Reviewed  COMPREHENSIVE METABOLIC PANEL - Abnormal; Notable for the following:       Result Value   Glucose, Bld 108 (*)    BUN 22 (*)    ALT 14 (*)    All other components within normal limits  LIPASE, BLOOD - Abnormal; Notable for the following:    Lipase 111 (*)    All other components within normal limits  URINALYSIS, COMPLETE (UACMP) WITH MICROSCOPIC - Abnormal; Notable for the following:    Color, Urine YELLOW (*)    APPearance CLEAR (*)    All other components within normal limits  URINE DRUG SCREEN, QUALITATIVE ([facility] ONLY) - Abnormal; Notable for the following:    Cocaine Metabolite,Ur [place] POSITIVE (*)    Cannabinoid 50 Ng, Ur [place] POSITIVE (*)    All other components within normal limits  CBC WITH DIFFERENTIAL/PLATELET  ETHANOL    Pertinent labs  results that were available during my care of the patient were reviewed by me and considered in my medical decision making (see chart for details). ____________________________________________  EKG  I personally interpreted any EKGs ordered by me or triage  ____________________________________________  RADIOLOGY  Pertinent labs & imaging results that were available during my care of the patient were reviewed by me and considered in my medical decision making (see chart for details). If possible, patient and/or family made aware of any abnormal findings. ____________________________________________     PROCEDURES  Procedure(s) performed: None  Procedures  Critical Care performed: None  ____________________________________________   INITIAL IMPRESSION / ASSESSMENT AND PLAN / ED COURSE  Pertinent labs & imaging results that were available during my care of the patient were reviewed by me and considered in my medical decision making (see chart for details).  patient here with abdominal pain for several months. We did do a CT scan to rule out oncologic or other occult pathology which is reassuring negative. Patient is positive for cocaine and marijuana, which significantly could contribute but there is no evidence of ischemic gut fortunately. Patient also does admit to drinking prior to the advent of his discomfort this time and his lipase is mildly elevated. I've advised him not to drink alcohol. I think given the chronicity of this complaint, patient would benefit from outpatient GI follow-up. I have advised him to modify his recreational habits.Findings are otherwise very reassuring he has had extensive workup in the last few days,At  this time, there does not appear to be clinical evidence to support the diagnosis of pulmonary embolus, dissection, myocarditis, endocarditis, pericarditis, pericardial tamponade, acute coronary syndrome, pneumothorax, pneumonia, or any other acute intrathoracic pathology that will require admission or acute intervention. Nor is there evidence of any significant intra-abdominal pathology causing this discomfort. Considering the patient's symptoms, medical history, and physical examination today, I have low suspicion for cholecystitis or biliary pathology, decompensated or severe pancreatitis, perforation or bowel obstruction, hernia, intra-abdominal abscess, AAA or dissection, volvulus or intussusception, mesenteric ischemia, ischemic gut, pyelonephritis or appendicitis.   ____________________________________________   FINAL CLINICAL IMPRESSION(S) / ED  DIAGNOSES  Final diagnoses:  None      This chart was dictated using voice recognition software.  Despite best efforts to proofread,  errors can occur which can change meaning.      Jeanmarie Plant, MD 01/13/17 1501

## 2017-01-15 ENCOUNTER — Encounter: Payer: Self-pay | Admitting: Gastroenterology

## 2017-01-15 ENCOUNTER — Ambulatory Visit: Payer: Self-pay | Admitting: Gastroenterology

## 2017-01-15 NOTE — Progress Notes (Deleted)
He has been referred for pancreatitis. He was seen at the ER on 01/13/17 when he presented with epigastric pain and RUQ pain . H/o alcohol abuse . Last drink of alcohol was 2 days prior to the hospital visit. Lipase was 111. Urine positive for THC and cocaine . CT scan of the abdomen did not show any gross abnormality . Narrowing of the gastric pylorus was noted which could have been from peristalsis. CMP,CBC was normal . Similarly was seen at the ER on 11/26/16 for right sided chest pain , lipase checked was 128    Abdominal pain less likely from pancreatitis. Could have been from use of THC/Cocaine or gastritis.    Plan  1. Stop all use of illegal drugs 2. EGD when has stopped all illegal drug use 3. Stop all alcohol

## 2017-01-28 ENCOUNTER — Encounter: Payer: Self-pay | Admitting: Gastroenterology

## 2017-01-28 ENCOUNTER — Ambulatory Visit (INDEPENDENT_AMBULATORY_CARE_PROVIDER_SITE_OTHER): Payer: Self-pay | Admitting: Gastroenterology

## 2017-01-28 ENCOUNTER — Other Ambulatory Visit: Payer: Self-pay

## 2017-01-28 VITALS — BP 126/71 | HR 69 | Temp 98.2°F | Ht 70.0 in | Wt 169.6 lb

## 2017-01-28 DIAGNOSIS — K859 Acute pancreatitis without necrosis or infection, unspecified: Secondary | ICD-10-CM

## 2017-01-28 DIAGNOSIS — G8929 Other chronic pain: Secondary | ICD-10-CM

## 2017-01-28 DIAGNOSIS — R1011 Right upper quadrant pain: Secondary | ICD-10-CM

## 2017-01-28 DIAGNOSIS — F1290 Cannabis use, unspecified, uncomplicated: Secondary | ICD-10-CM

## 2017-01-28 HISTORY — DX: Acute pancreatitis without necrosis or infection, unspecified: K85.90

## 2017-01-28 MED ORDER — OMEPRAZOLE 20 MG PO CPDR: 20.0000 mg | DELAYED_RELEASE_CAPSULE | Freq: Two times a day (BID) | ORAL | 2 refills | Status: DC

## 2017-01-28 NOTE — Progress Notes (Signed)
Arlyss Repress, MD [address]  [address]  [place], [place] [address]  Main: [phone]  Fax: [phone]    Gastroenterology Consultation  Referring Provider:     No ref. provider found Primary Care Physician:  Patient, No Pcp Per Primary Gastroenterologist:  Dr. Arlyss Repress Reason for Consultation:    Chronic right upper quadrant pain        HPI:   Scott [name] is a 41 y.o. y/o male referred from the ER for consultation & management of Chronic right upper quadrant pain.  He describes a right upper quadrant pain as sharp burning type, severe in intensity, radiating to left scapula and left lower back, worse with deep breathing and after eating and sometimes not associated with food.   He has history of tobacco and marijuana use. Onset of right upper quadrant pain in 11/22/2016 when he presented to the emergency room,initially was thought to have pneumonia based on her chest x-ray and was given antibiotics and the symptoms persisted. Returned to ER on 11/26/2016 with same symptoms, found to have mildly elevated lipase to 129, ultrasound did not reveal cholelithiasis, CBD normal caliber, had a CT PE protocol which revealed right lower lobe atelectasis and mild nonspecific mediastinal lymphadenopathy. He was discharged home on slow diet advancement, Zofran, Percocet and follow up with PCP. He returned to the ER again on 01/13/2017 with same symptoms and was found to have mildly elevated lipase of 111 and urine toxicology screen was positive for marijuana, cocaine. His alcohol levels were undetectable CBC, CMP were normal. He had a repeat CT A/P revealed possible narrowing of the pylorus.  He has been taking NSAIDs since the onset of pain for the last 3 months, up to 6 pills daily. He lost about 10-15 pounds in last 3 months due to decreased by mouth intake secondary to pain and nausea He also reports change in stool caliber but denies hematochezia or rectal bleeding or  melena Smoke cigarettes one and half pack per day, 20+ years Denies alcohol use Smokes marijuana Denies any GI surgeries or family history of GI malignancy He does not have a PCP GI Procedures: None  Past Medical History:  Diagnosis Date  . Aortic stenosis   . Murmur, cardiac   . Pancreatitis, acute 01/28/2017    Past Surgical History:  Procedure Laterality Date  . CARDIAC SURGERY    . FINGER SURGERY      Prior to Admission medications   Medication Sig Start Date End Date Taking? Authorizing Provider  ibuprofen (ADVIL,MOTRIN) 600 MG tablet Take 1 tablet (600 mg total) by mouth every 8 (eight) hours as needed. 11/16/15  Yes Joni Reining, PA-C    No family history on file.   Social History  Substance Use Topics  . Smoking status: Current Every Day Smoker  . Smokeless tobacco: Never Used  . Alcohol use Yes     Comment: occ    Allergies as of 01/28/2017 - Review Complete 01/28/2017  Allergen Reaction Noted  . Cocoa Hives 11/12/2014    Review of Systems:    All systems reviewed and negative except where noted in HPI.   Physical Exam:  BP 126/71 (BP Location: Right Arm)   Pulse 69   Temp 98.2 F (36.8 C) (Oral)   Ht  (1.778 m)   Wt 169 lb 9.6 oz (76.9 kg)   BMI 24.34 kg/m  No LMP for male patient.  General:   Alert,  Well-developed, well-nourished, pleasant  and cooperative in NAD Head:  Normocephalic and atraumatic. Eyes:  Sclera clear, no icterus.   Conjunctiva pink. Ears:  Normal auditory acuity. Nose:  No deformity, discharge, or lesions. Mouth:  No deformity or lesions,oropharynx pink & moist. Neck:  Supple; no masses or thyromegaly. Lungs:  Respirations even and unlabored.  Clear throughout to auscultation.   No wheezes, crackles, or rhonchi. No acute distress. Heart:  Regular rate and rhythm; no murmurs, clicks, rubs, or gallops. Abdomen:  Normal bowel sounds. Soft, mildright upper quadrant tenderness and non-distended without masses,  hepatosplenomegaly or hernias noted.  No guarding or rebound tenderness.   Rectal: Nor performed Msk:  Symmetrical without gross deformities. Good, equal movement & strength bilaterally. Pulses:  Normal pulses noted. Extremities:  No clubbing or edema.  No cyanosis. Neurologic:  Alert and oriented x3;  grossly normal neurologically. Skin:  Intact without significant lesions or rashes. No jaundice. Lymph Nodes:  No significant cervical adenopathy. Psych:  Alert and cooperative. Normal mood and affect.  Imaging Studies: reviewed  Assessment and Plan:   Scott [name] is a 41 y.o. male with tobacco, polysubstance abuse, U tox positive for cocaine and marijuana, 3 months history of moderate to severe right upper quadrant pain associated with nausea and mildly elevated lipase levels without imaging evidence of cholelithiasis or pancreatitis or bile duct obstruction. He has history of heavy NSAID use secondary to pain. Differentials include peptic ulcer disease secondary to cocaine use or H. Pylori infection or chronic pancreatitis with history of heavy smoking or biliary dyskinesia  - Start omeprazole 20 mg twice a day - Avoid NSAIDs, switch to Tylenol 650 mg up to 3 times a day for pain - Abstinence from tobacco use and other recreational drugs - EGD to further evaluate right upper quadrant pain - HIDA scan to evaluate for biliary dyskinesia - check ANA, IgG 4, lipase  Follow up in 4 weeks   Arlyss Repress, MD

## 2017-01-29 ENCOUNTER — Other Ambulatory Visit
Admission: RE | Admit: 2017-01-29 | Discharge: 2017-01-29 | Disposition: A | Discharge status: Home / Self Care | Payer: Self-pay | Source: Ambulatory Visit | Attending: Gastroenterology | Admitting: Gastroenterology

## 2017-01-29 DIAGNOSIS — R1011 Right upper quadrant pain: Secondary | ICD-10-CM | POA: Insufficient documentation

## 2017-01-29 DIAGNOSIS — K859 Acute pancreatitis without necrosis or infection, unspecified: Secondary | ICD-10-CM | POA: Insufficient documentation

## 2017-01-29 DIAGNOSIS — G8929 Other chronic pain: Secondary | ICD-10-CM | POA: Insufficient documentation

## 2017-01-29 LAB — LIPASE, BLOOD: Lipase: 19 U/L (ref 11–51)

## 2017-01-30 LAB — IGG 4: IGG 4: 42 mg/dL (ref 2–96)

## 2017-01-30 LAB — ANTINUCLEAR ANTIBODIES, IFA: ANA Ab, IFA: NEGATIVE

## 2017-01-31 ENCOUNTER — Ambulatory Visit
Admission: RE | Admit: 2017-01-31 | Discharge: 2017-01-31 | Disposition: A | Discharge status: Home / Self Care | Payer: Self-pay | Source: Ambulatory Visit | Attending: Gastroenterology | Admitting: Gastroenterology

## 2017-01-31 ENCOUNTER — Encounter: Payer: Self-pay | Admitting: Gastroenterology

## 2017-01-31 DIAGNOSIS — R1011 Right upper quadrant pain: Secondary | ICD-10-CM | POA: Insufficient documentation

## 2017-01-31 MED ORDER — TECHNETIUM TC 99M MEBROFENIN IV KIT
5.1980 | PACK | Freq: Once | INTRAVENOUS | Status: AC | PRN
  Administered 2017-01-31: 5.198 via INTRAVENOUS

## 2017-02-01 ENCOUNTER — Encounter: Payer: Self-pay | Admitting: Gastroenterology

## 2017-02-13 ENCOUNTER — Encounter: Payer: Self-pay | Admitting: *Deleted

## 2017-02-14 ENCOUNTER — Ambulatory Visit: Admission: RE | Admit: 2017-02-14 | Payer: Self-pay | Source: Ambulatory Visit | Admitting: Gastroenterology

## 2017-02-14 ENCOUNTER — Encounter: Admission: RE | Payer: Self-pay | Source: Ambulatory Visit

## 2017-02-14 SURGERY — ESOPHAGOGASTRODUODENOSCOPY (EGD) WITH PROPOFOL
Anesthesia: General

## 2018-06-12 DIAGNOSIS — F410 Panic disorder [episodic paroxysmal anxiety] without agoraphobia: Secondary | ICD-10-CM | POA: Insufficient documentation

## 2018-06-12 DIAGNOSIS — M5412 Radiculopathy, cervical region: Secondary | ICD-10-CM | POA: Insufficient documentation

## 2018-06-12 DIAGNOSIS — F419 Anxiety disorder, unspecified: Secondary | ICD-10-CM | POA: Insufficient documentation

## 2018-09-15 ENCOUNTER — Other Ambulatory Visit: Payer: Self-pay

## 2018-09-15 ENCOUNTER — Encounter
Admission: RE | Admit: 2018-09-15 | Discharge: 2018-09-15 | Disposition: A | Discharge status: Home / Self Care | Payer: BC Managed Care – PPO | Source: Ambulatory Visit | Attending: Cardiology | Admitting: Cardiology

## 2018-09-15 DIAGNOSIS — Z1159 Encounter for screening for other viral diseases: Secondary | ICD-10-CM | POA: Diagnosis not present

## 2018-09-15 DIAGNOSIS — Z01812 Encounter for preprocedural laboratory examination: Secondary | ICD-10-CM | POA: Diagnosis not present

## 2018-09-15 LAB — SARS CORONAVIRUS 2 BY RT PCR (HOSPITAL ORDER, PERFORMED IN ~~LOC~~ HOSPITAL LAB): SARS Coronavirus 2: NEGATIVE

## 2018-09-17 ENCOUNTER — Ambulatory Visit
Admission: RE | Admit: 2018-09-17 | Discharge: 2018-09-17 | Disposition: A | Discharge status: Home / Self Care | Payer: BC Managed Care – PPO | Attending: Cardiology | Admitting: Cardiology

## 2018-09-17 ENCOUNTER — Encounter
Admission: RE | Disposition: A | Discharge status: Home / Self Care | Payer: Self-pay | Source: Home / Self Care | Attending: Cardiology

## 2018-09-17 ENCOUNTER — Encounter: Payer: Self-pay | Admitting: *Deleted

## 2018-09-17 ENCOUNTER — Other Ambulatory Visit: Payer: Self-pay

## 2018-09-17 DIAGNOSIS — I352 Nonrheumatic aortic (valve) stenosis with insufficiency: Secondary | ICD-10-CM | POA: Insufficient documentation

## 2018-09-17 DIAGNOSIS — R079 Chest pain, unspecified: Secondary | ICD-10-CM | POA: Insufficient documentation

## 2018-09-17 DIAGNOSIS — R42 Dizziness and giddiness: Secondary | ICD-10-CM | POA: Diagnosis not present

## 2018-09-17 DIAGNOSIS — R5383 Other fatigue: Secondary | ICD-10-CM | POA: Insufficient documentation

## 2018-09-17 DIAGNOSIS — R011 Cardiac murmur, unspecified: Secondary | ICD-10-CM | POA: Diagnosis not present

## 2018-09-17 DIAGNOSIS — Z8249 Family history of ischemic heart disease and other diseases of the circulatory system: Secondary | ICD-10-CM | POA: Insufficient documentation

## 2018-09-17 DIAGNOSIS — I350 Nonrheumatic aortic (valve) stenosis: Secondary | ICD-10-CM

## 2018-09-17 DIAGNOSIS — F17200 Nicotine dependence, unspecified, uncomplicated: Secondary | ICD-10-CM | POA: Diagnosis not present

## 2018-09-17 HISTORY — PX: RIGHT/LEFT HEART CATH AND CORONARY ANGIOGRAPHY: CATH118266

## 2018-09-17 SURGERY — RIGHT/LEFT HEART CATH AND CORONARY ANGIOGRAPHY
Anesthesia: Moderate Sedation | Laterality: Bilateral

## 2018-09-17 MED ORDER — ONDANSETRON HCL 4 MG/2ML IJ SOLN: 4.0000 mg | Freq: Four times a day (QID) | INTRAMUSCULAR | Status: DC | PRN

## 2018-09-17 MED ORDER — SODIUM CHLORIDE 0.9 % WEIGHT BASED INFUSION
3.0000 mL/kg/h | INTRAVENOUS | Status: AC
  Administered 2018-09-17: 3 mL/kg/h via INTRAVENOUS

## 2018-09-17 MED ORDER — SODIUM CHLORIDE 0.9 % IV SOLN: 250.0000 mL | INTRAVENOUS | Status: DC | PRN

## 2018-09-17 MED ORDER — ASPIRIN 81 MG PO CHEW
CHEWABLE_TABLET | ORAL | Status: AC
  Filled 2018-09-17: qty 1

## 2018-09-17 MED ORDER — SODIUM CHLORIDE 0.9 % WEIGHT BASED INFUSION: 1.0000 mL/kg/h | INTRAVENOUS | Status: DC

## 2018-09-17 MED ORDER — MIDAZOLAM HCL 2 MG/2ML IJ SOLN
INTRAMUSCULAR | Status: DC | PRN
  Administered 2018-09-17: 0.5 mg via INTRAVENOUS

## 2018-09-17 MED ORDER — LABETALOL HCL 5 MG/ML IV SOLN: 10.0000 mg | INTRAVENOUS | Status: DC | PRN

## 2018-09-17 MED ORDER — ACETAMINOPHEN 325 MG PO TABS: 650.0000 mg | ORAL_TABLET | ORAL | Status: DC | PRN

## 2018-09-17 MED ORDER — HYDRALAZINE HCL 20 MG/ML IJ SOLN: 10.0000 mg | INTRAMUSCULAR | Status: DC | PRN

## 2018-09-17 MED ORDER — HEPARIN SODIUM (PORCINE) 1000 UNIT/ML IJ SOLN
INTRAMUSCULAR | Status: DC | PRN
  Administered 2018-09-17: 4000 [IU] via INTRAVENOUS

## 2018-09-17 MED ORDER — SODIUM CHLORIDE 0.9% FLUSH: 3.0000 mL | Freq: Two times a day (BID) | INTRAVENOUS | Status: DC

## 2018-09-17 MED ORDER — ASPIRIN 81 MG PO CHEW
81.0000 mg | CHEWABLE_TABLET | ORAL | Status: AC
  Administered 2018-09-17: 81 mg via ORAL

## 2018-09-17 MED ORDER — HEPARIN (PORCINE) IN NACL 1000-0.9 UT/500ML-% IV SOLN
INTRAVENOUS | Status: AC
  Filled 2018-09-17: qty 1000

## 2018-09-17 MED ORDER — FENTANYL CITRATE (PF) 100 MCG/2ML IJ SOLN
INTRAMUSCULAR | Status: DC | PRN
  Administered 2018-09-17: 25 ug via INTRAVENOUS

## 2018-09-17 MED ORDER — SODIUM CHLORIDE 0.9% FLUSH: 3.0000 mL | INTRAVENOUS | Status: DC | PRN

## 2018-09-17 MED ORDER — HEPARIN SODIUM (PORCINE) 1000 UNIT/ML IJ SOLN
INTRAMUSCULAR | Status: AC
  Filled 2018-09-17: qty 1

## 2018-09-17 MED ORDER — IOHEXOL 300 MG/ML  SOLN
INTRAMUSCULAR | Status: DC | PRN
  Administered 2018-09-17: 180 mL via INTRAVENOUS

## 2018-09-17 MED ORDER — FENTANYL CITRATE (PF) 100 MCG/2ML IJ SOLN
INTRAMUSCULAR | Status: AC
  Filled 2018-09-17: qty 2

## 2018-09-17 MED ORDER — MIDAZOLAM HCL 2 MG/2ML IJ SOLN
INTRAMUSCULAR | Status: AC
  Filled 2018-09-17: qty 2

## 2018-09-17 MED ORDER — HEPARIN (PORCINE) IN NACL 1000-0.9 UT/500ML-% IV SOLN
INTRAVENOUS | Status: DC | PRN
  Administered 2018-09-17: 1000 mL

## 2018-09-17 SURGICAL SUPPLY — 15 items
CATH INFINITI 5FR ANG PIGTAIL (CATHETERS) ×3 IMPLANT
CATH INFINITI 5FR JL4 (CATHETERS) ×3 IMPLANT
CATH INFINITI JR4 5F (CATHETERS) ×3 IMPLANT
CATH SWANZ 7F THERMO (CATHETERS) ×3 IMPLANT
DEVICE CLOSURE MYNXGRIP 6/7F (Vascular Products) ×3 IMPLANT
GUIDEWIRE EMER 3M J .025X150CM (WIRE) ×3 IMPLANT
KIT MANI 3VAL PERCEP (MISCELLANEOUS) ×3 IMPLANT
KIT RIGHT HEART (MISCELLANEOUS) ×3 IMPLANT
NEEDLE PERC 18GX7CM (NEEDLE) ×3 IMPLANT
PACK CARDIAC CATH (CUSTOM PROCEDURE TRAY) ×3 IMPLANT
SHEATH AVANTI 6FR X 11CM (SHEATH) ×3 IMPLANT
SHEATH AVANTI 7FRX11 (SHEATH) ×3 IMPLANT
WIRE EMERALD 3MM-J .035X260CM (WIRE) ×3 IMPLANT
WIRE EMERALD ST .035X150CM (WIRE) ×3 IMPLANT
WIRE GUIDERIGHT .035X150 (WIRE) ×3 IMPLANT

## 2018-10-19 DIAGNOSIS — Z720 Tobacco use: Secondary | ICD-10-CM | POA: Insufficient documentation

## 2018-10-20 DIAGNOSIS — I771 Stricture of artery: Secondary | ICD-10-CM | POA: Insufficient documentation

## 2018-10-20 DIAGNOSIS — I209 Angina pectoris, unspecified: Secondary | ICD-10-CM | POA: Insufficient documentation

## 2018-10-20 DIAGNOSIS — I5032 Chronic diastolic (congestive) heart failure: Secondary | ICD-10-CM | POA: Insufficient documentation

## 2018-10-20 DIAGNOSIS — Z8774 Personal history of (corrected) congenital malformations of heart and circulatory system: Secondary | ICD-10-CM | POA: Insufficient documentation

## 2018-10-20 DIAGNOSIS — J439 Emphysema, unspecified: Secondary | ICD-10-CM | POA: Insufficient documentation

## 2018-10-22 DIAGNOSIS — I4510 Unspecified right bundle-branch block: Secondary | ICD-10-CM | POA: Insufficient documentation

## 2018-10-24 DIAGNOSIS — I480 Paroxysmal atrial fibrillation: Secondary | ICD-10-CM | POA: Insufficient documentation

## 2018-10-24 DIAGNOSIS — Z789 Other specified health status: Secondary | ICD-10-CM | POA: Insufficient documentation

## 2018-10-24 DIAGNOSIS — R6889 Other general symptoms and signs: Secondary | ICD-10-CM | POA: Insufficient documentation

## 2018-11-14 ENCOUNTER — Ambulatory Visit
Admission: RE | Admit: 2018-11-14 | Discharge: 2018-11-14 | Disposition: A | Discharge status: Home / Self Care | Payer: BC Managed Care – PPO | Attending: Family Medicine | Admitting: Family Medicine

## 2018-11-14 ENCOUNTER — Ambulatory Visit
Admission: RE | Admit: 2018-11-14 | Discharge: 2018-11-14 | Disposition: A | Discharge status: Home / Self Care | Payer: BC Managed Care – PPO | Source: Ambulatory Visit | Attending: Family Medicine | Admitting: Family Medicine

## 2018-11-14 ENCOUNTER — Other Ambulatory Visit: Payer: Self-pay

## 2018-11-14 ENCOUNTER — Other Ambulatory Visit: Payer: Self-pay | Admitting: Family Medicine

## 2018-11-14 DIAGNOSIS — R0602 Shortness of breath: Secondary | ICD-10-CM | POA: Insufficient documentation

## 2018-11-20 DIAGNOSIS — Z7901 Long term (current) use of anticoagulants: Secondary | ICD-10-CM | POA: Insufficient documentation

## 2018-11-20 DIAGNOSIS — R791 Abnormal coagulation profile: Secondary | ICD-10-CM | POA: Insufficient documentation

## 2019-03-20 ENCOUNTER — Emergency Department (HOSPITAL_COMMUNITY)
Admission: EM | Admit: 2019-03-20 | Discharge: 2019-03-21 | Disposition: A | Discharge status: Home / Self Care | Payer: Self-pay | Attending: Emergency Medicine | Admitting: Emergency Medicine

## 2019-03-20 ENCOUNTER — Emergency Department (HOSPITAL_COMMUNITY): Payer: Self-pay

## 2019-03-20 ENCOUNTER — Encounter (HOSPITAL_COMMUNITY): Payer: Self-pay

## 2019-03-20 ENCOUNTER — Other Ambulatory Visit: Payer: Self-pay

## 2019-03-20 DIAGNOSIS — R5383 Other fatigue: Secondary | ICD-10-CM | POA: Insufficient documentation

## 2019-03-20 DIAGNOSIS — Z959 Presence of cardiac and vascular implant and graft, unspecified: Secondary | ICD-10-CM | POA: Insufficient documentation

## 2019-03-20 DIAGNOSIS — R0602 Shortness of breath: Secondary | ICD-10-CM | POA: Insufficient documentation

## 2019-03-20 DIAGNOSIS — F17210 Nicotine dependence, cigarettes, uncomplicated: Secondary | ICD-10-CM | POA: Insufficient documentation

## 2019-03-20 DIAGNOSIS — R079 Chest pain, unspecified: Secondary | ICD-10-CM | POA: Insufficient documentation

## 2019-03-20 DIAGNOSIS — J189 Pneumonia, unspecified organism: Secondary | ICD-10-CM | POA: Insufficient documentation

## 2019-03-20 DIAGNOSIS — R5381 Other malaise: Secondary | ICD-10-CM | POA: Insufficient documentation

## 2019-03-20 DIAGNOSIS — Z20828 Contact with and (suspected) exposure to other viral communicable diseases: Secondary | ICD-10-CM | POA: Insufficient documentation

## 2019-03-20 DIAGNOSIS — F1210 Cannabis abuse, uncomplicated: Secondary | ICD-10-CM | POA: Insufficient documentation

## 2019-03-20 LAB — TROPONIN I (HIGH SENSITIVITY): Troponin I (High Sensitivity): 6 ng/L (ref <18)

## 2019-03-20 LAB — PROTIME-INR
INR: 2 — ABNORMAL HIGH (ref 0.8–1.2)
Prothrombin Time: 22.7 seconds — ABNORMAL HIGH (ref 11.4–15.2)

## 2019-03-20 LAB — CBC
HCT: 42.4 % (ref 39.0–52.0)
Hemoglobin: 13.9 g/dL (ref 13.0–17.0)
MCH: 29.9 pg (ref 26.0–34.0)
MCHC: 32.8 g/dL (ref 30.0–36.0)
MCV: 91.2 fL (ref 80.0–100.0)
Platelets: 279 10*3/uL (ref 150–400)
RBC: 4.65 MIL/uL (ref 4.22–5.81)
RDW: 13.8 % (ref 11.5–15.5)
WBC: 7.1 10*3/uL (ref 4.0–10.5)
nRBC: 0 % (ref 0.0–0.2)

## 2019-03-20 LAB — COMPREHENSIVE METABOLIC PANEL
ALT: 21 U/L (ref 0–44)
AST: 25 U/L (ref 15–41)
Albumin: 4.3 g/dL (ref 3.5–5.0)
Alkaline Phosphatase: 94 U/L (ref 38–126)
Anion gap: 10 (ref 5–15)
BUN: 14 mg/dL (ref 6–20)
CO2: 22 mmol/L (ref 22–32)
Calcium: 8.8 mg/dL — ABNORMAL LOW (ref 8.9–10.3)
Chloride: 104 mmol/L (ref 98–111)
Creatinine, Ser: 0.92 mg/dL (ref 0.61–1.24)
GFR calc Af Amer: >60 mL/min (ref >60)
GFR calc non Af Amer: >60 mL/min (ref >60)
Glucose, Bld: 96 mg/dL (ref 70–99)
Potassium: 3.4 mmol/L — ABNORMAL LOW (ref 3.5–5.1)
Sodium: 136 mmol/L (ref 135–145)
Total Bilirubin: 0.8 mg/dL (ref 0.3–1.2)
Total Protein: 7.5 g/dL (ref 6.5–8.1)

## 2019-03-20 LAB — POC SARS CORONAVIRUS 2 AG -  ED: SARS Coronavirus 2 Ag: NEGATIVE

## 2019-03-20 LAB — BRAIN NATRIURETIC PEPTIDE: B Natriuretic Peptide: 20 pg/mL (ref 0.0–100.0)

## 2019-03-20 MED ORDER — IOHEXOL 350 MG/ML SOLN
100.0000 mL | Freq: Once | INTRAVENOUS | Status: AC | PRN
  Administered 2019-03-21: 100 mL via INTRAVENOUS

## 2019-03-20 NOTE — ED Provider Notes (Signed)
[facility] Emergency Department Provider Note MRN:  [id number]  Arrival date & time: 03/20/19     Chief Complaint   Near Syncope   History of Present Illness   Scott [name] is a 43 y.o. year-old male with a history of aortic stenosis status post valve replacement presenting to the ED with chief complaint of near syncope.  For 3 days patient has been trying to move his possessions and fracture.  Every day, with exertion, he has been feeling worse and worse.  He is endorsing exertional chest pain/pressure, shortness of breath, severe lightheadedness, he feels his pulse in his whole body, he feels generally unwell, feels strange.  Denies sick contacts, no fever, no cough.  Denies abdominal pain, no vomiting, no diarrhea.  Mechanical valve placed in July.  Review of Systems  A complete 10 system review of systems was obtained and all systems are negative except as noted in the HPI and PMH.   Patient's Health History    Past Medical History:  Diagnosis Date  . Aortic stenosis   . Murmur, cardiac   . Pancreatitis, acute 01/28/2017    Past Surgical History:  Procedure Laterality Date  . AORTIC VALVE REPAIR     when pt was 43 years old  . CARDIAC SURGERY    . FINGER SURGERY    . MECHANICAL AORTIC VALVE REPLACEMENT     pt has card with mechanical valve replacemnt details  . RIGHT/LEFT HEART CATH AND CORONARY ANGIOGRAPHY Bilateral 09/17/2018   Procedure: RIGHT/LEFT HEART CATH AND CORONARY ANGIOGRAPHY;  Surgeon: Isaias Cowman, MD;  Location: [facility];  Service: Cardiovascular;  Laterality: Bilateral;    No family history on file.  Social History   Socioeconomic History  . Marital status: Married    Spouse name: Not on file  . Number of children: Not on file  . Years of education: Not on file  . Highest education level: Not on file  Occupational History  . Not on file  Social Needs  . Financial resource strain: Not on file  . Food  insecurity    Worry: Not on file    Inability: Not on file  . Transportation needs    Medical: Not on file    Non-medical: Not on file  Tobacco Use  . Smoking status: Current Every Day Smoker    Packs/day: 1.00    Types: Cigarettes  . Smokeless tobacco: Never Used  Substance and Sexual Activity  . Alcohol use: Yes    Comment: occ  . Drug use: Yes    Types: Marijuana  . Sexual activity: Yes  Lifestyle  . Physical activity    Days per week: Not on file    Minutes per session: Not on file  . Stress: Not on file  Relationships  . Social Herbalist on phone: Not on file    Gets together: Not on file    Attends religious service: Not on file    Active member of club or organization: Not on file    Attends meetings of clubs or organizations: Not on file    Relationship status: Not on file  . Intimate partner violence    Fear of current or ex partner: Not on file    Emotionally abused: Not on file    Physically abused: Not on file    Forced sexual activity: Not on file  Other Topics Concern  . Not on file  Social History Narrative  .  Not on file     Physical Exam  Vital Signs and Nursing Notes reviewed Vitals:   03/20/19 2142 03/20/19 2300  BP: (!) 144/83 117/71  Pulse: 68 (!) 57  Resp: 17 15  Temp: 98.7 F (37.1 C)   SpO2: 99% 98%    CONSTITUTIONAL: Well-appearing, NAD NEURO:  Alert and oriented x 3, no focal deficits EYES:  eyes equal and reactive ENT/NECK:  no LAD, no JVD CARDIO: Regular rate, well-perfused, normal S1 and S2 PULM:  CTAB no wheezing or rhonchi GI/GU:  normal bowel sounds, non-distended, non-tender MSK/SPINE:  No gross deformities, no edema SKIN:  no rash, atraumatic PSYCH:  Appropriate speech and behavior  Diagnostic and Interventional Summary    EKG Interpretation  Date/Time:  March 20, 2019 at 21: 33: 51 Ventricular Rate:  68 PR Interval:  142 QRS Duration: 144 QT Interval:  446 QTC Calculation: 474 R Axis:     Text  Interpretation: Sinus rhythm, right bundle branch block with left anterior fascicular block No recent prior.  Confirmed by Dr. Kennis Carina at 11 PM      Labs Reviewed  COMPREHENSIVE METABOLIC PANEL - Abnormal; Notable for the following components:      Result Value   Potassium 3.4 (*)    Calcium 8.8 (*)    All other components within normal limits  PROTIME-INR - Abnormal; Notable for the following components:   Prothrombin Time 22.7 (*)    INR 2.0 (*)    All other components within normal limits  CBC  BRAIN NATRIURETIC PEPTIDE  POC SARS CORONAVIRUS 2 AG -  ED  TROPONIN I (HIGH SENSITIVITY)    XR Chest Single View  Final Result    CTA Chest for PE    (Results Pending)    Medications  iohexol (OMNIPAQUE) 350 MG/ML injection 100 mL (has no administration in time range)     Procedures  /  Critical Care Procedures  ED Course and Medical Decision Making  I have reviewed the triage vital signs and the nursing notes.  Pertinent labs & imaging results that were available during my care of the patient were reviewed by me and considered in my medical decision making (see below for details).     History of mechanical aortic valve here with lightheadedness, chest pain, shortness of breath, vague sensation of bounding pulses, tingling sensations.  Normal vital signs, well-appearing, not fluid overloaded, no significant murmur, will evaluate with labs and CT chest.  If unremarkable and continued normal vital signs, would suggest close outpatient cardiology follow-up for repeat echo and advising patient to avoid any exertional activity.   Chest x-ray demonstrating evidence of atypical infection, rapid Covid test is negative, will need formal test.  Awaiting CTA, signed out to oncoming provider at shift change.  Elmer Sow. Pilar Plate, MD [facility] [email or web address]  Final Clinical Impressions(s) / ED Diagnoses     ICD-10-CM   1. Malaise  and fatigue  R53.81 XR Chest Single View   R53.83 XR Chest Single View    ED Discharge Orders    None       Discharge Instructions Discussed with and Provided to Patient:   Discharge Instructions   None       Sabas Sous, MD 03/20/19 2359

## 2019-03-20 NOTE — ED Notes (Signed)
Ortho static vitals completed at this time. Patient on 12 lead

## 2019-03-20 NOTE — ED Triage Notes (Addendum)
Pt reports feeling lightheaded and dizzy for the past 3 days after strenuous activity. Pt reports he has been moving to an upstairs appt.and for the past few days, after activity, pt has felt like he "was going to pass out with dizziness, blurred vision". "After resting symptoms would improve, but come back, then today symptoms present with no strenuous activity. Pt reports hands, ankles, and face have been swollen as well for the past couple of days. Pt had open heart surgery with valve replacement in July of 2020

## 2019-03-21 LAB — SARS CORONAVIRUS 2 (TAT 6-24 HRS): SARS Coronavirus 2: NEGATIVE

## 2019-03-21 LAB — TROPONIN I (HIGH SENSITIVITY): Troponin I (High Sensitivity): 6 ng/L (ref <18)

## 2019-03-21 MED ORDER — AMOXICILLIN 500 MG PO CAPS: 1000.0000 mg | ORAL_CAPSULE | Freq: Two times a day (BID) | ORAL | 0 refills | Status: DC

## 2019-03-21 MED ORDER — AMOXICILLIN 250 MG PO CAPS
1000.0000 mg | ORAL_CAPSULE | Freq: Once | ORAL | Status: AC
  Administered 2019-03-21: 1000 mg via ORAL
  Filled 2019-03-21: qty 4

## 2019-03-21 NOTE — ED Provider Notes (Addendum)
Care assumed from Dr. Maurie Boettcher, patient with episode of near syncope and generalized malaise.  Chest x-ray suggested bilateral pneumonia concerning for atypical pneumonia.  He was signed out to me pending CT angiogram.  CT angiogram showed no evidence of pulmonary embolism, isolated infiltrate in the right lower lobe.  This appears to be a  typical pneumonia.  He is discharged with a prescription for amoxicillin, follow-up with PCP.  Results for orders placed or performed during the hospital encounter of 03/20/19  CBC  Result Value Ref Range   WBC 7.1 4.0 - 10.5 K/uL   RBC 4.65 4.22 - 5.81 MIL/uL   Hemoglobin 13.9 13.0 - 17.0 g/dL   HCT 42.4 39.0 - 52.0 %   MCV 91.2 80.0 - 100.0 fL   MCH 29.9 26.0 - 34.0 pg   MCHC 32.8 30.0 - 36.0 g/dL   RDW 13.8 11.5 - 15.5 %   Platelets 279 150 - 400 K/uL   nRBC 0.0 0.0 - 0.2 %  CMP  Result Value Ref Range   Sodium 136 135 - 145 mmol/L   Potassium 3.4 (L) 3.5 - 5.1 mmol/L   Chloride 104 98 - 111 mmol/L   CO2 22 22 - 32 mmol/L   Glucose, Bld 96 70 - 99 mg/dL   BUN 14 6 - 20 mg/dL   Creatinine, Ser 0.92 0.61 - 1.24 mg/dL   Calcium 8.8 (L) 8.9 - 10.3 mg/dL   Total Protein 7.5 6.5 - 8.1 g/dL   Albumin 4.3 3.5 - 5.0 g/dL   AST 25 15 - 41 U/L   ALT 21 0 - 44 U/L   Alkaline Phosphatase 94 38 - 126 U/L   Total Bilirubin 0.8 0.3 - 1.2 mg/dL   GFR calc non Af Amer >60 >60 mL/min   GFR calc Af Amer >60 >60 mL/min   Anion gap 10 5 - 15  BNP  Result Value Ref Range   B Natriuretic Peptide 20.0 0.0 - 100.0 pg/mL  PT  Result Value Ref Range   Prothrombin Time 22.7 (H) 11.4 - 15.2 seconds   INR 2.0 (H) 0.8 - 1.2  POC SARS Coronavirus 2 Ag-ED - Nasal Swab (BD Veritor Kit)  Result Value Ref Range   SARS Coronavirus 2 Ag NEGATIVE NEGATIVE  Troponin  Result Value Ref Range   Troponin I (High Sensitivity) 6 <18 ng/L   Cta Chest For Pe  Result Date: 03/21/2019 CLINICAL DATA:  Lightheaded and dizziness EXAM: CT ANGIOGRAPHY CHEST WITH CONTRAST TECHNIQUE:  Multidetector CT imaging of the chest was performed using the standard protocol during bolus administration of intravenous contrast. Multiplanar CT image reconstructions and MIPs were obtained to evaluate the vascular anatomy. CONTRAST:  142m OMNIPAQUE IOHEXOL 350 MG/ML SOLN COMPARISON:  November 26, 2016 FINDINGS: Cardiovascular: There is a optimal opacification of the pulmonary arteries. There is no central,segmental, or subsegmental filling defects within the pulmonary arteries. There is mild cardiomegaly. Again noted is prosthetic aortic valve. Overlying median sternotomy wires are present. There is normal three-vessel brachiocephalic anatomy without proximal stenosis. The thoracic aorta is normal in appearance. Mediastinum/Nodes: No hilar, mediastinal, or axillary adenopathy. Thyroid gland, trachea, and esophagus demonstrate no significant findings. Lungs/Pleura: Right pleural thickening is noted with rounded patchy airspace opacity in the periphery of the right lower lung. There is also a subpleural bleb seen along the medial left upper lung. Upper Abdomen: No acute abnormalities present in the visualized portions of the upper abdomen. Musculoskeletal: No chest wall abnormality. No acute or  significant osseous findings. 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 Mild degenerative changes seen in thoracic spine. Review of the MIP images confirms the above findings. IMPRESSION: 1. No central, segmental, or subsegmental pulmonary embolism. 2. Right-sided pleural thickening with a rounded patchy airspace opacity in the posterior periphery of the right lower lung. This could be due to round atelectasis/scarring and/or infectious etiology. Followup PA and lateral chest X-ray is recommended in 3-4 weeks following treatment to ensure resolution Electronically Signed   By: Prudencio Pair M.D.   On: 03/21/2019 00:47   Xr Chest Single View  Result Date: 03/20/2019 CLINICAL DATA:  Lightheadedness.   Dizziness. EXAM: PORTABLE CHEST 1 VIEW COMPARISON:  November 14, 2018 FINDINGS: The cardiomediastinal silhouette is stable. No pneumothorax. Increased interstitial markings in the lungs. Interval resolution of the left pleural effusion. Blunting of the right costophrenic angle remains stable. No other changes. IMPRESSION: Bilateral pulmonary opacities with interstitial components could represent pulmonary edema versus an atypical infection. Recommend clinical correlation and short-term follow-up imaging to ensure resolution. Electronically Signed   By: Dorise Bullion III M.D   On: 03/20/2019 22:50  Images viewed by me.    [name], MD 70/48/88 [id number]    [name], MD 45/03/88 0100

## 2019-03-21 NOTE — Discharge Instructions (Addendum)
Take acetaminophen as needed for fever or aching.  Return if symptoms are getting worse.

## 2019-05-18 ENCOUNTER — Other Ambulatory Visit: Payer: Self-pay

## 2019-05-18 ENCOUNTER — Ambulatory Visit: Payer: Self-pay | Attending: Internal Medicine

## 2019-05-18 DIAGNOSIS — Z20822 Contact with and (suspected) exposure to covid-19: Secondary | ICD-10-CM | POA: Insufficient documentation

## 2019-05-20 LAB — NOVEL CORONAVIRUS, NAA: SARS-CoV-2, NAA: NOT DETECTED

## 2019-09-12 ENCOUNTER — Emergency Department (HOSPITAL_COMMUNITY)
Admission: EM | Admit: 2019-09-12 | Discharge: 2019-09-12 | Disposition: A | Discharge status: Home / Self Care | Payer: Self-pay | Attending: Emergency Medicine | Admitting: Emergency Medicine

## 2019-09-12 ENCOUNTER — Encounter (HOSPITAL_COMMUNITY): Payer: Self-pay | Admitting: *Deleted

## 2019-09-12 ENCOUNTER — Other Ambulatory Visit: Payer: Self-pay

## 2019-09-12 DIAGNOSIS — Z7901 Long term (current) use of anticoagulants: Secondary | ICD-10-CM | POA: Insufficient documentation

## 2019-09-12 DIAGNOSIS — R5381 Other malaise: Secondary | ICD-10-CM | POA: Insufficient documentation

## 2019-09-12 DIAGNOSIS — F17210 Nicotine dependence, cigarettes, uncomplicated: Secondary | ICD-10-CM | POA: Insufficient documentation

## 2019-09-12 DIAGNOSIS — R519 Headache, unspecified: Secondary | ICD-10-CM | POA: Insufficient documentation

## 2019-09-12 DIAGNOSIS — A6920 Lyme disease, unspecified: Secondary | ICD-10-CM | POA: Insufficient documentation

## 2019-09-12 DIAGNOSIS — Z952 Presence of prosthetic heart valve: Secondary | ICD-10-CM | POA: Insufficient documentation

## 2019-09-12 DIAGNOSIS — A6929 Other conditions associated with Lyme disease: Secondary | ICD-10-CM | POA: Insufficient documentation

## 2019-09-12 DIAGNOSIS — R202 Paresthesia of skin: Secondary | ICD-10-CM | POA: Insufficient documentation

## 2019-09-12 LAB — CBC WITH DIFFERENTIAL/PLATELET
Abs Immature Granulocytes: 0.01 10*3/uL (ref 0.00–0.07)
Basophils Absolute: 0.1 10*3/uL (ref 0.0–0.1)
Basophils Relative: 1 %
Eosinophils Absolute: 0.3 10*3/uL (ref 0.0–0.5)
Eosinophils Relative: 4 %
HCT: 42.4 % (ref 39.0–52.0)
Hemoglobin: 14.2 g/dL (ref 13.0–17.0)
Immature Granulocytes: 0 %
Lymphocytes Relative: 34 %
Lymphs Abs: 2.4 10*3/uL (ref 0.7–4.0)
MCH: 31.4 pg (ref 26.0–34.0)
MCHC: 33.5 g/dL (ref 30.0–36.0)
MCV: 93.8 fL (ref 80.0–100.0)
Monocytes Absolute: 0.9 10*3/uL (ref 0.1–1.0)
Monocytes Relative: 13 %
Neutro Abs: 3.5 10*3/uL (ref 1.7–7.7)
Neutrophils Relative %: 48 %
Platelets: 254 10*3/uL (ref 150–400)
RBC: 4.52 MIL/uL (ref 4.22–5.81)
RDW: 13.3 % (ref 11.5–15.5)
WBC: 7.1 10*3/uL (ref 4.0–10.5)
nRBC: 0 % (ref 0.0–0.2)

## 2019-09-12 LAB — COMPREHENSIVE METABOLIC PANEL
ALT: 18 U/L (ref 0–44)
AST: 20 U/L (ref 15–41)
Albumin: 4 g/dL (ref 3.5–5.0)
Alkaline Phosphatase: 88 U/L (ref 38–126)
Anion gap: 6 (ref 5–15)
BUN: 18 mg/dL (ref 6–20)
CO2: 24 mmol/L (ref 22–32)
Calcium: 8.8 mg/dL — ABNORMAL LOW (ref 8.9–10.3)
Chloride: 106 mmol/L (ref 98–111)
Creatinine, Ser: 0.88 mg/dL (ref 0.61–1.24)
GFR calc Af Amer: >60 mL/min (ref >60)
GFR calc non Af Amer: >60 mL/min (ref >60)
Glucose, Bld: 89 mg/dL (ref 70–99)
Potassium: 3.6 mmol/L (ref 3.5–5.1)
Sodium: 136 mmol/L (ref 135–145)
Total Bilirubin: 0.7 mg/dL (ref 0.3–1.2)
Total Protein: 6.9 g/dL (ref 6.5–8.1)

## 2019-09-12 MED ORDER — DOXYCYCLINE HYCLATE 100 MG PO CAPS: 100.0000 mg | ORAL_CAPSULE | Freq: Two times a day (BID) | ORAL | 0 refills | Status: AC

## 2019-09-12 MED ORDER — DOXYCYCLINE HYCLATE 100 MG PO CAPS: 100.0000 mg | ORAL_CAPSULE | Freq: Two times a day (BID) | ORAL | 0 refills | Status: DC

## 2019-09-12 NOTE — Discharge Instructions (Addendum)
You have been treated for Lyme disease.  You are prescribed an antibiotic please take as prescribed.  Please be careful while on this medication as it can make you more susceptible to sunlight make sure you wear sunscreen and protect your skin.  I want you to follow-up with your primary care doctor for further evaluation of your Lyme disease.  You are to return to the emergency department if you develop increasing numbness or tingling arm in your limbs, have difficulty swallowing, slurred speech, inability to control limbs, have chest pain, shortness of breath, dominant, diarrhea as the symptoms require further evaluation.

## 2019-09-12 NOTE — ED Provider Notes (Signed)
[facility] EMERGENCY DEPARTMENT Provider Note   CSN: [id number] Arrival date & time: 09/12/19  1336     History Chief Complaint  Patient presents with  . Tick Removal    rash now    Scott [name] is a 44 y.o. male.  HPI   Patient presents emergency department chief complaint of tick bite with bull's-eye ring rash on the right upper part of his back.  Patient admits to malaise, headaches, some tingling down his right arm arm.  Has any other deficits, no slurring of speech, no falls. Has significant medical history of aortic valve replacement and is currently on anticoags.  Patient is not immunocompromise, he is not diabetic. He denies any fever, chills, shortness of breath, nausea, vomiting, diarrhea.    Past Medical History:  Diagnosis Date  . Aortic stenosis   . Murmur, cardiac   . Pancreatitis, acute 01/28/2017    Patient Active Problem List   Diagnosis Date Noted  . Pancreatitis, acute 01/28/2017  . 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 AORTIC VALVE REPAIR     when pt was 44 years old  . CARDIAC SURGERY    . FINGER SURGERY    . MECHANICAL AORTIC VALVE REPLACEMENT     pt has card with mechanical valve replacemnt details  . RIGHT/LEFT HEART CATH AND CORONARY ANGIOGRAPHY Bilateral 09/17/2018   Procedure: RIGHT/LEFT HEART CATH AND CORONARY ANGIOGRAPHY;  Surgeon: Isaias Cowman, MD;  Location: [place] [facility];  Service: Cardiovascular;  Laterality: Bilateral;       History reviewed. No pertinent family history.  Social History   Tobacco Use  . Smoking status: Current Every Day Smoker    Packs/day: 1.00    Types: Cigarettes  . Smokeless tobacco: Never Used  Substance Use Topics  . Alcohol use: Yes    Comment: occ  . Drug use: Yes    Types: Marijuana    Home Medications Prior to Admission medications   Medication Sig Start Date End Date Taking? Authorizing Provider  amoxicillin (AMOXIL) 500 MG  capsule Take 2 capsules (1,000 mg total) by mouth 2 (two) times daily. 40/9/81   [name], MD  doxycycline (VIBRAMYCIN) 100 MG capsule Take 1 capsule (100 mg total) by mouth 2 (two) times daily for 14 days. 09/12/19 09/26/19  Marcello Fennel, PA-C  warfarin (COUMADIN) 2.5 MG tablet Take 7.5 mg by mouth daily.    [provider]    Allergies    Cocoa  Review of Systems   Review of Systems  Constitutional: Negative for chills, fatigue and fever.  HENT: Negative for congestion and sore throat.   Eyes: Negative for photophobia and visual disturbance.  Respiratory: Negative for cough and shortness of breath.   Cardiovascular: Negative for chest pain and leg swelling.  Gastrointestinal: Negative for abdominal pain, diarrhea, nausea and vomiting.  Genitourinary: Negative for enuresis, flank pain and frequency.  Musculoskeletal: Negative for back pain and neck stiffness.       Patient history of general malaise, denies any rash, bruising, or suffering any weakness in  of his extremities.  Skin: Negative for rash.  Neurological: Positive for numbness and headaches. Negative for dizziness.       Admits to some numbness and tingling down his right arm.  Hematological: Does not bruise/bleed easily.    Physical Exam Updated Vital Signs BP 121/65 (BP Location: Right Arm)   Pulse (!) 59   Temp 97.9 F (36.6 C) (  Oral)   Resp 14   Ht 5\' 11"  (1.803 m)   Wt 80.7 kg   SpO2 98%   BMI 24.83 kg/m   Physical Exam Vitals and nursing note reviewed.  Constitutional:      General: He is not in acute distress.    Appearance: He is not ill-appearing.  HENT:     Head: Normocephalic and atraumatic.     Nose: No congestion.     Mouth/Throat:     Mouth: Mucous membranes are moist.     Pharynx: Oropharynx is clear.  Eyes:     General: No visual field deficit or scleral icterus. Cardiovascular:     Rate and Rhythm: Normal rate and regular rhythm.     Pulses: Normal pulses.     Heart  sounds: No murmur. No friction rub. No gallop.   Pulmonary:     Effort: No respiratory distress.     Breath sounds: No wheezing, rhonchi or rales.  Abdominal:     General: There is no distension.     Tenderness: There is no abdominal tenderness. There is no guarding.  Musculoskeletal:        General: No swelling.  Skin:    General: Skin is warm and dry.     Findings: No rash.  Neurological:     Mental Status: He is alert and oriented to person, place, and time.     GCS: GCS eye subscore is 4. GCS verbal subscore is 5. GCS motor subscore is 6.     Cranial Nerves: Cranial nerves are intact. No cranial nerve deficit or facial asymmetry.     Motor: Motor function is intact. No weakness, tremor, atrophy or pronator drift.     Coordination: Finger-Nose-Finger Test normal.     Gait: Gait normal.     Comments: Patient has a tingling sensation down his right arm.  No weakness, good pulses and cap refills.  Psychiatric:        Mood and Affect: Mood normal.     ED Results / Procedures / Treatments   Labs (all labs ordered are listed, but only abnormal results are displayed) Labs Reviewed  LYME DISEASE, WESTERN BLOT  B. BURGDORFI ANTIBODIES  CBC WITH DIFFERENTIAL/PLATELET  COMPREHENSIVE METABOLIC PANEL    EKG None  Radiology No results found.  Procedures Procedures (including critical care time)  Medications Ordered in ED Medications - No data to display  ED Course  I have reviewed the triage vital signs and the nursing notes.  Pertinent labs & imaging results that were available during my care of the patient were reviewed by me and considered in my medical decision making (see chart for details).    MDM Rules/Calculators/A&P                      I have personally reviewed all imaging, labs and have interpreted them.  Patient presents with chief complaint of bull's-eye rash on the right upper part of his back after removing a tick.  I am most concerned for Lyme disease,  cellulitis, [place] spotted fever.  I have very low suspicion of cellulitis as patient is afebrile, nontachycardic, area around the skin does not have any discharge, drainage, or swelling.  Also have low suspicion of the [facility] spotted fever as he has no rash, afebrile, nontachycardic, not having difficulty breathing.  mostly likely the patient has Lyme disease and he will be treated with doxycycline for the next 2 weeks.  Labs  are drawn as well as Lyme titers for a baseline.  Patient was instructed to follow-up with his primary care doctor in the next 3 weeks for further evaluation.  Was given at home care instructions as well as strict return precautions.  Patient was explained results and plan above patient verbalized that he understood and agrees with above plan.  Final Clinical Impression(s) / ED Diagnoses Final diagnoses:  Lyme disease with erythema migrans lesion 5 cm or greater in diameter    Rx / DC Orders ED Discharge Orders         Ordered    doxycycline (VIBRAMYCIN) 100 MG capsule  2 times daily,   Status:  Discontinued     09/12/19 1423    doxycycline (VIBRAMYCIN) 100 MG capsule  2 times daily     09/12/19 1428           Barnie Del 09/12/19 1432    [name], MD 09/13/19 [phone]

## 2019-09-12 NOTE — ED Provider Notes (Signed)
This patient is a pleasant 44 year old male presenting with a rash on his right scapular area.  It is slightly itchy and enlarging.  This is the site of a prior tick bite.  He is also having some joint aches.  On exam the patient is well-appearing with normal vital signs.  He does not have any significant murmur despite having a valve replacement in the past.  He has a clear erythema migrans rash at the site of the tick bite consistent with Lyme's disease.  He will have antibodies drawn for clarification, as well as basic labs.  He will need doxycycline twice daily for 14 days and close follow-up, he follows up with [facility] primary care at [facility].  Patient stable for discharge and understands indications for return.  Medical screening examination/treatment/procedure(s) were conducted as a shared visit with non-physician practitioner(s) and myself.  I personally evaluated the patient during the encounter.  Clinical Impression:   Final diagnoses:  Lyme disease with erythema migrans lesion 5 cm or greater in diameter         [name], MD 09/13/19 1512

## 2019-09-12 NOTE — ED Triage Notes (Addendum)
Pt removed a tick about 2-3 weeks ago, now has the bull's eye rash to right shoulder.  Denies fever. Last tetanus last year.   Hands feel swollen and tingling. Achy joints.

## 2019-09-15 LAB — B. BURGDORFI ANTIBODIES: B burgdorferi Ab IgG+IgM: <0.91 {ISR} (ref 0.00–0.90)

## 2019-09-17 LAB — LYME DISEASE, WESTERN BLOT
IgG P18 Ab.: ABSENT
IgG P23 Ab.: ABSENT
IgG P28 Ab.: ABSENT
IgG P30 Ab.: ABSENT
IgG P39 Ab.: ABSENT
IgG P41 Ab.: ABSENT
IgG P45 Ab.: ABSENT
IgG P66 Ab.: ABSENT
IgG P93 Ab.: ABSENT
IgM P23 Ab.: ABSENT
IgM P39 Ab.: ABSENT
IgM P41 Ab.: ABSENT
Lyme IgG Wb: NEGATIVE
Lyme IgM Wb: NEGATIVE

## 2019-12-15 ENCOUNTER — Other Ambulatory Visit (HOSPITAL_COMMUNITY)
Admission: RE | Admit: 2019-12-15 | Discharge: 2019-12-15 | Disposition: A | Discharge status: Home / Self Care | Payer: Self-pay | Source: Ambulatory Visit | Attending: Cardiology | Admitting: Cardiology

## 2019-12-15 DIAGNOSIS — Z7901 Long term (current) use of anticoagulants: Secondary | ICD-10-CM | POA: Insufficient documentation

## 2019-12-15 LAB — PROTIME-INR
INR: 2.2 — ABNORMAL HIGH (ref 0.8–1.2)
Prothrombin Time: 23.5 seconds — ABNORMAL HIGH (ref 11.4–15.2)

## 2019-12-16 ENCOUNTER — Ambulatory Visit
Admission: RE | Admit: 2019-12-16 | Discharge: 2019-12-16 | Disposition: A | Discharge status: Home / Self Care | Payer: Self-pay | Source: Ambulatory Visit

## 2019-12-16 ENCOUNTER — Other Ambulatory Visit: Payer: Self-pay

## 2019-12-16 DIAGNOSIS — Z1152 Encounter for screening for COVID-19: Secondary | ICD-10-CM

## 2019-12-16 NOTE — ED Triage Notes (Signed)
Pt here for covid test, mild symptoms only wants testing

## 2019-12-17 ENCOUNTER — Emergency Department (HOSPITAL_COMMUNITY)
Admission: EM | Admit: 2019-12-17 | Discharge: 2019-12-17 | Disposition: A | Discharge status: Home / Self Care | Payer: Self-pay

## 2019-12-17 ENCOUNTER — Other Ambulatory Visit: Payer: Self-pay

## 2019-12-18 LAB — NOVEL CORONAVIRUS, NAA: SARS-CoV-2, NAA: NOT DETECTED

## 2019-12-28 ENCOUNTER — Other Ambulatory Visit (HOSPITAL_COMMUNITY): Admission: RE | Admit: 2019-12-28 | Payer: Self-pay | Source: Ambulatory Visit | Admitting: *Deleted

## 2019-12-28 ENCOUNTER — Other Ambulatory Visit: Payer: Self-pay | Admitting: Cardiology

## 2019-12-28 DIAGNOSIS — Z952 Presence of prosthetic heart valve: Secondary | ICD-10-CM

## 2019-12-28 DIAGNOSIS — Z7901 Long term (current) use of anticoagulants: Secondary | ICD-10-CM

## 2019-12-29 ENCOUNTER — Other Ambulatory Visit: Payer: Self-pay

## 2019-12-29 ENCOUNTER — Other Ambulatory Visit (HOSPITAL_COMMUNITY)
Admission: RE | Admit: 2019-12-29 | Discharge: 2019-12-29 | Disposition: A | Discharge status: Home / Self Care | Payer: Self-pay | Source: Ambulatory Visit | Attending: Cardiology | Admitting: Cardiology

## 2019-12-29 DIAGNOSIS — Z952 Presence of prosthetic heart valve: Secondary | ICD-10-CM | POA: Insufficient documentation

## 2019-12-29 DIAGNOSIS — Z7901 Long term (current) use of anticoagulants: Secondary | ICD-10-CM | POA: Insufficient documentation

## 2019-12-29 LAB — PROTIME-INR
INR: 3.9 — ABNORMAL HIGH (ref 0.8–1.2)
Prothrombin Time: 37 seconds — ABNORMAL HIGH (ref 11.4–15.2)

## 2019-12-30 ENCOUNTER — Emergency Department: Payer: Self-pay

## 2019-12-30 ENCOUNTER — Emergency Department
Admission: EM | Admit: 2019-12-30 | Discharge: 2019-12-30 | Disposition: A | Discharge status: Home / Self Care | Payer: Self-pay | Attending: Emergency Medicine | Admitting: Emergency Medicine

## 2019-12-30 ENCOUNTER — Other Ambulatory Visit: Payer: Self-pay

## 2019-12-30 DIAGNOSIS — R0789 Other chest pain: Secondary | ICD-10-CM | POA: Insufficient documentation

## 2019-12-30 DIAGNOSIS — Z954 Presence of other heart-valve replacement: Secondary | ICD-10-CM | POA: Insufficient documentation

## 2019-12-30 DIAGNOSIS — Z5321 Procedure and treatment not carried out due to patient leaving prior to being seen by health care provider: Secondary | ICD-10-CM | POA: Insufficient documentation

## 2019-12-30 LAB — BASIC METABOLIC PANEL
Anion gap: 9 (ref 5–15)
BUN: 11 mg/dL (ref 6–20)
CO2: 27 mmol/L (ref 22–32)
Calcium: 9.2 mg/dL (ref 8.9–10.3)
Chloride: 101 mmol/L (ref 98–111)
Creatinine, Ser: 0.91 mg/dL (ref 0.61–1.24)
GFR calc Af Amer: >60 mL/min (ref >60)
GFR calc non Af Amer: >60 mL/min (ref >60)
Glucose, Bld: 124 mg/dL — ABNORMAL HIGH (ref 70–99)
Potassium: 3.7 mmol/L (ref 3.5–5.1)
Sodium: 137 mmol/L (ref 135–145)

## 2019-12-30 LAB — CBC
HCT: 43.7 % (ref 39.0–52.0)
Hemoglobin: 15.1 g/dL (ref 13.0–17.0)
MCH: 31.1 pg (ref 26.0–34.0)
MCHC: 34.6 g/dL (ref 30.0–36.0)
MCV: 90.1 fL (ref 80.0–100.0)
Platelets: 331 10*3/uL (ref 150–400)
RBC: 4.85 MIL/uL (ref 4.22–5.81)
RDW: 13.2 % (ref 11.5–15.5)
WBC: 10.3 10*3/uL (ref 4.0–10.5)
nRBC: 0 % (ref 0.0–0.2)

## 2019-12-30 LAB — PROTIME-INR
INR: 3.8 — ABNORMAL HIGH (ref 0.8–1.2)
Prothrombin Time: 36.4 seconds — ABNORMAL HIGH (ref 11.4–15.2)

## 2019-12-30 LAB — TROPONIN I (HIGH SENSITIVITY): Troponin I (High Sensitivity): 8 ng/L (ref <18)

## 2019-12-30 NOTE — ED Triage Notes (Signed)
Pt with left sided chest pain for 4 days. Pt with history of aortic valve replacement. Pt describes as sharp.

## 2020-01-14 ENCOUNTER — Encounter (HOSPITAL_COMMUNITY)
Admission: RE | Admit: 2020-01-14 | Discharge: 2020-01-14 | Disposition: A | Discharge status: Home / Self Care | Payer: Self-pay | Source: Ambulatory Visit | Attending: Cardiology | Admitting: Cardiology

## 2020-01-14 ENCOUNTER — Other Ambulatory Visit: Payer: Self-pay

## 2020-01-14 DIAGNOSIS — Z952 Presence of prosthetic heart valve: Secondary | ICD-10-CM | POA: Insufficient documentation

## 2020-01-14 DIAGNOSIS — Z7901 Long term (current) use of anticoagulants: Secondary | ICD-10-CM | POA: Insufficient documentation

## 2020-01-14 LAB — PROTIME-INR
INR: 3 — ABNORMAL HIGH (ref 0.8–1.2)
Prothrombin Time: 30.1 seconds — ABNORMAL HIGH (ref 11.4–15.2)

## 2020-01-26 ENCOUNTER — Encounter (HOSPITAL_COMMUNITY)
Admission: RE | Admit: 2020-01-26 | Discharge: 2020-01-26 | Disposition: A | Discharge status: Home / Self Care | Payer: Self-pay | Source: Ambulatory Visit | Attending: Cardiology | Admitting: Cardiology

## 2020-01-26 DIAGNOSIS — Z7901 Long term (current) use of anticoagulants: Secondary | ICD-10-CM | POA: Insufficient documentation

## 2020-01-26 DIAGNOSIS — Z952 Presence of prosthetic heart valve: Secondary | ICD-10-CM | POA: Insufficient documentation

## 2020-01-26 LAB — PROTIME-INR
INR: 1.6 — ABNORMAL HIGH (ref 0.8–1.2)
Prothrombin Time: 18.6 seconds — ABNORMAL HIGH (ref 11.4–15.2)

## 2020-02-03 ENCOUNTER — Encounter (HOSPITAL_COMMUNITY)
Admission: RE | Admit: 2020-02-03 | Discharge: 2020-02-03 | Disposition: A | Discharge status: Home / Self Care | Payer: Self-pay | Source: Ambulatory Visit | Attending: Cardiology | Admitting: Cardiology

## 2020-02-03 ENCOUNTER — Other Ambulatory Visit: Payer: Self-pay

## 2020-02-03 LAB — PROTIME-INR
INR: 2.8 — ABNORMAL HIGH (ref 0.8–1.2)
Prothrombin Time: 28.8 seconds — ABNORMAL HIGH (ref 11.4–15.2)

## 2020-02-15 ENCOUNTER — Other Ambulatory Visit: Payer: Self-pay

## 2020-02-16 ENCOUNTER — Emergency Department (HOSPITAL_COMMUNITY): Admission: EM | Admit: 2020-02-16 | Discharge: 2020-02-16 | Discharge status: Against Medical Advice | Payer: Self-pay

## 2020-02-18 ENCOUNTER — Other Ambulatory Visit: Payer: Self-pay

## 2020-02-18 ENCOUNTER — Encounter (HOSPITAL_COMMUNITY)
Admission: RE | Admit: 2020-02-18 | Discharge: 2020-02-18 | Disposition: A | Discharge status: Home / Self Care | Payer: Self-pay | Source: Ambulatory Visit | Attending: Cardiology | Admitting: Cardiology

## 2020-02-18 DIAGNOSIS — Z7901 Long term (current) use of anticoagulants: Secondary | ICD-10-CM | POA: Insufficient documentation

## 2020-02-18 DIAGNOSIS — Z952 Presence of prosthetic heart valve: Secondary | ICD-10-CM | POA: Insufficient documentation

## 2020-02-18 LAB — PROTIME-INR
INR: 2.5 — ABNORMAL HIGH (ref 0.8–1.2)
Prothrombin Time: 26.3 seconds — ABNORMAL HIGH (ref 11.4–15.2)

## 2020-03-17 ENCOUNTER — Encounter (HOSPITAL_COMMUNITY)
Admission: RE | Admit: 2020-03-17 | Discharge: 2020-03-17 | Disposition: A | Discharge status: Home / Self Care | Payer: Self-pay | Source: Ambulatory Visit | Attending: Cardiology | Admitting: Cardiology

## 2020-03-17 ENCOUNTER — Other Ambulatory Visit: Payer: Self-pay

## 2020-03-17 DIAGNOSIS — Z7901 Long term (current) use of anticoagulants: Secondary | ICD-10-CM | POA: Insufficient documentation

## 2020-03-17 DIAGNOSIS — Z952 Presence of prosthetic heart valve: Secondary | ICD-10-CM | POA: Insufficient documentation

## 2020-03-17 LAB — PROTIME-INR
INR: 2.5 — ABNORMAL HIGH (ref 0.8–1.2)
Prothrombin Time: 26 seconds — ABNORMAL HIGH (ref 11.4–15.2)

## 2020-04-11 ENCOUNTER — Other Ambulatory Visit: Payer: Self-pay

## 2020-04-11 ENCOUNTER — Other Ambulatory Visit (HOSPITAL_COMMUNITY)
Admission: RE | Admit: 2020-04-11 | Discharge: 2020-04-11 | Disposition: A | Discharge status: Home / Self Care | Payer: Self-pay | Source: Ambulatory Visit | Attending: Cardiology | Admitting: Cardiology

## 2020-04-11 DIAGNOSIS — Z7901 Long term (current) use of anticoagulants: Secondary | ICD-10-CM | POA: Insufficient documentation

## 2020-04-11 DIAGNOSIS — Z952 Presence of prosthetic heart valve: Secondary | ICD-10-CM | POA: Insufficient documentation

## 2020-04-11 LAB — PROTIME-INR
INR: 2 — ABNORMAL HIGH (ref 0.8–1.2)
Prothrombin Time: 21.8 seconds — ABNORMAL HIGH (ref 11.4–15.2)

## 2020-05-05 ENCOUNTER — Other Ambulatory Visit: Payer: Self-pay

## 2020-05-05 ENCOUNTER — Encounter (HOSPITAL_COMMUNITY)
Admission: RE | Admit: 2020-05-05 | Discharge: 2020-05-05 | Disposition: A | Discharge status: Home / Self Care | Payer: Self-pay | Source: Ambulatory Visit | Attending: Cardiology | Admitting: Cardiology

## 2020-05-05 DIAGNOSIS — Z952 Presence of prosthetic heart valve: Secondary | ICD-10-CM | POA: Insufficient documentation

## 2020-05-05 LAB — PROTIME-INR
INR: 1.5 — ABNORMAL HIGH (ref 0.8–1.2)
Prothrombin Time: 17.8 seconds — ABNORMAL HIGH (ref 11.4–15.2)

## 2020-05-30 ENCOUNTER — Encounter (HOSPITAL_COMMUNITY)
Admission: RE | Admit: 2020-05-30 | Discharge: 2020-05-30 | Disposition: A | Discharge status: Home / Self Care | Payer: Self-pay | Source: Ambulatory Visit | Attending: Cardiology | Admitting: Cardiology

## 2020-05-30 ENCOUNTER — Other Ambulatory Visit: Payer: Self-pay

## 2020-05-30 DIAGNOSIS — Z952 Presence of prosthetic heart valve: Secondary | ICD-10-CM | POA: Insufficient documentation

## 2020-05-30 DIAGNOSIS — Z7901 Long term (current) use of anticoagulants: Secondary | ICD-10-CM | POA: Insufficient documentation

## 2020-05-30 LAB — PROTIME-INR
INR: 1.1 (ref 0.8–1.2)
Prothrombin Time: 13.3 seconds (ref 11.4–15.2)

## 2020-06-09 ENCOUNTER — Encounter (HOSPITAL_COMMUNITY)
Admission: RE | Admit: 2020-06-09 | Discharge: 2020-06-09 | Disposition: A | Discharge status: Home / Self Care | Payer: Self-pay | Source: Ambulatory Visit | Attending: Cardiology | Admitting: Cardiology

## 2020-06-09 DIAGNOSIS — Z7901 Long term (current) use of anticoagulants: Secondary | ICD-10-CM

## 2020-06-09 DIAGNOSIS — Z952 Presence of prosthetic heart valve: Secondary | ICD-10-CM

## 2020-06-09 LAB — PROTIME-INR
INR: 1.7 — ABNORMAL HIGH (ref 0.8–1.2)
Prothrombin Time: 19.2 seconds — ABNORMAL HIGH (ref 11.4–15.2)

## 2020-07-07 ENCOUNTER — Other Ambulatory Visit: Payer: Self-pay

## 2020-07-07 ENCOUNTER — Encounter (HOSPITAL_COMMUNITY)
Admission: RE | Admit: 2020-07-07 | Discharge: 2020-07-07 | Disposition: A | Discharge status: Home / Self Care | Payer: Self-pay | Source: Ambulatory Visit | Attending: Cardiology | Admitting: Cardiology

## 2020-07-07 DIAGNOSIS — Z7901 Long term (current) use of anticoagulants: Secondary | ICD-10-CM | POA: Insufficient documentation

## 2020-07-07 DIAGNOSIS — Z952 Presence of prosthetic heart valve: Secondary | ICD-10-CM | POA: Insufficient documentation

## 2020-07-07 LAB — PROTIME-INR
INR: 2.1 — ABNORMAL HIGH (ref 0.8–1.2)
Prothrombin Time: 22.6 seconds — ABNORMAL HIGH (ref 11.4–15.2)

## 2020-08-04 ENCOUNTER — Other Ambulatory Visit: Payer: Self-pay

## 2020-08-04 ENCOUNTER — Encounter (HOSPITAL_COMMUNITY)
Admission: RE | Admit: 2020-08-04 | Discharge: 2020-08-04 | Disposition: A | Discharge status: Home / Self Care | Payer: Medicaid Other | Source: Ambulatory Visit | Attending: Cardiology | Admitting: Cardiology

## 2020-08-04 DIAGNOSIS — Z7901 Long term (current) use of anticoagulants: Secondary | ICD-10-CM | POA: Insufficient documentation

## 2020-08-04 DIAGNOSIS — Z952 Presence of prosthetic heart valve: Secondary | ICD-10-CM | POA: Diagnosis not present

## 2020-08-04 LAB — PROTIME-INR
INR: 2.5 — ABNORMAL HIGH (ref 0.8–1.2)
Prothrombin Time: 27.4 seconds — ABNORMAL HIGH (ref 11.4–15.2)

## 2020-09-05 ENCOUNTER — Other Ambulatory Visit (HOSPITAL_COMMUNITY)
Admission: RE | Admit: 2020-09-05 | Discharge: 2020-09-05 | Disposition: A | Discharge status: Home / Self Care | Payer: Medicaid Other | Source: Ambulatory Visit | Attending: Cardiology | Admitting: Cardiology

## 2020-09-05 ENCOUNTER — Other Ambulatory Visit: Payer: Self-pay

## 2020-09-05 DIAGNOSIS — Z952 Presence of prosthetic heart valve: Secondary | ICD-10-CM | POA: Diagnosis present

## 2020-09-05 LAB — PROTIME-INR
INR: 1.1 (ref 0.8–1.2)
Prothrombin Time: 14.2 seconds (ref 11.4–15.2)

## 2020-09-23 ENCOUNTER — Other Ambulatory Visit (HOSPITAL_COMMUNITY)
Admission: RE | Admit: 2020-09-23 | Discharge: 2020-09-23 | Disposition: A | Discharge status: Home / Self Care | Payer: Medicaid Other | Source: Ambulatory Visit | Attending: Cardiology | Admitting: Cardiology

## 2020-09-23 ENCOUNTER — Other Ambulatory Visit: Payer: Self-pay

## 2020-09-23 DIAGNOSIS — Z952 Presence of prosthetic heart valve: Secondary | ICD-10-CM | POA: Insufficient documentation

## 2020-09-23 LAB — PROTIME-INR
INR: 2.8 — ABNORMAL HIGH (ref 0.8–1.2)
Prothrombin Time: 29.3 seconds — ABNORMAL HIGH (ref 11.4–15.2)

## 2020-10-04 ENCOUNTER — Other Ambulatory Visit: Payer: Self-pay

## 2020-10-04 ENCOUNTER — Ambulatory Visit (INDEPENDENT_AMBULATORY_CARE_PROVIDER_SITE_OTHER): Payer: Medicaid Other | Admitting: Nurse Practitioner

## 2020-10-04 ENCOUNTER — Encounter: Payer: Self-pay | Admitting: Nurse Practitioner

## 2020-10-04 VITALS — BP 107/69 | HR 70 | Temp 97.5°F | Ht 71.0 in | Wt 167.0 lb

## 2020-10-04 DIAGNOSIS — J0100 Acute maxillary sinusitis, unspecified: Secondary | ICD-10-CM

## 2020-10-04 DIAGNOSIS — Z952 Presence of prosthetic heart valve: Secondary | ICD-10-CM

## 2020-10-04 DIAGNOSIS — Z139 Encounter for screening, unspecified: Secondary | ICD-10-CM

## 2020-10-04 DIAGNOSIS — Z0001 Encounter for general adult medical examination with abnormal findings: Secondary | ICD-10-CM | POA: Insufficient documentation

## 2020-10-04 DIAGNOSIS — Z7689 Persons encountering health services in other specified circumstances: Secondary | ICD-10-CM | POA: Diagnosis not present

## 2020-10-04 DIAGNOSIS — J329 Chronic sinusitis, unspecified: Secondary | ICD-10-CM | POA: Insufficient documentation

## 2020-10-04 DIAGNOSIS — J019 Acute sinusitis, unspecified: Secondary | ICD-10-CM | POA: Insufficient documentation

## 2020-10-04 MED ORDER — AZITHROMYCIN 250 MG PO TABS: ORAL_TABLET | ORAL | 0 refills | Status: DC

## 2020-10-04 NOTE — Assessment & Plan Note (Signed)
-  transferring from a [facility] PCP; obtain records

## 2020-10-04 NOTE — Assessment & Plan Note (Signed)
-  will screen for HCV and HIV with next set of labs 

## 2020-10-04 NOTE — Progress Notes (Signed)
New Patient Office Visit  Subjective:  Patient ID: Scott [name], male    DOB: 04/28/1975  Age: 45 y.o. MRN: [id number]  CC:  Chief Complaint  Patient presents with   Sinusitis    Sinus drainage ongoing x1 week, lots of presure   Otalgia    Both ears, ongoing x1 week    HPI Scott [name] presents for new patient visit. Transferring care from [facility], but he can't remember her name. Last physical was 1-2 years ago. He states he had open heart surgery 2 years ago, and his physical was completed around that time. Last labs were drawn 4 months ago.  Today he states his teeth, ears, and eyes are hurting and he feels like he has a lot of sinus pressure.  This has been ongoing for 7-10 days.  He is followed by Dr. Saralyn Pilar with [facility] for cardiology needs, and he is responsible for INR monitoring.  He sees a physician at [facility] for cardiology, maybe Dr. Tobe Sos. He would like to see a cardiologist in [place]. He has hx of aortic valve replacement, and he is on coumadin therapy with goal 2-3.  Past Medical History:  Diagnosis Date   Anxiety    Aortic stenosis    Murmur, cardiac    Pancreatitis, acute 01/28/2017    Past Surgical History:  Procedure Laterality Date   AORTIC VALVE REPAIR     when pt was 45 years old   CARDIAC SURGERY     FINGER SURGERY     MECHANICAL AORTIC VALVE REPLACEMENT     pt has card with mechanical valve replacemnt details   RIGHT/LEFT HEART CATH AND CORONARY ANGIOGRAPHY Bilateral 09/17/2018   Procedure: RIGHT/LEFT HEART CATH AND CORONARY ANGIOGRAPHY;  Surgeon: Isaias Cowman, MD;  Location: [place] [facility];  Service: Cardiovascular;  Laterality: Bilateral;    History reviewed. No pertinent family history.  Social History   Socioeconomic History   Marital status: Married    Spouse name: Not on file   Number of children: 5   Years of education: Not on file   Highest education level: Not on file  Occupational  History   Occupation: Press photographer    Comment: Dealer, services [employer]  Tobacco Use   Smoking status: Every Day    Packs/day: 1.50    Years: 30.00    Pack years: 45.00    Types: Cigarettes   Smokeless tobacco: Never  Vaping Use   Vaping Use: Never used  Substance and Sexual Activity   Alcohol use: Yes    Comment: occ   Drug use: Yes    Types: Marijuana   Sexual activity: Yes  Other Topics Concern   Not on file  Social History Narrative   Not on file   Social Determinants of Health   Financial Resource Strain: Not on file  Food Insecurity: Not on file  Transportation Needs: Not on file  Physical Activity: Not on file  Stress: Not on file  Social Connections: Not on file  Intimate Partner Violence: Not on file    ROS Review of Systems  Constitutional: Negative.   HENT:  Positive for congestion, ear pain, sinus pressure and sinus pain.   Respiratory: Negative.    Cardiovascular: Negative.   Psychiatric/Behavioral: Negative.     Objective:   Today's Vitals: BP 107/69 (BP Location: Right Arm, Patient Position: Sitting, Cuff Size: Large)   Pulse 70   Temp (!) 97.5 F (36.4 C) (Temporal)   Ht  '5\' 11"'  (1.803 m)   Wt 167 lb (75.8 kg)   SpO2 95%   BMI 23.29 kg/m   Physical Exam Constitutional:      Appearance: Normal appearance.  HENT:     Right Ear: Tympanic membrane, ear canal and external ear normal.     Left Ear: Tympanic membrane, ear canal and external ear normal.     Nose: Nose normal.     Comments: Sinus pain on palpation    Mouth/Throat:     Mouth: Mucous membranes are moist.     Pharynx: Oropharynx is clear.  Cardiovascular:     Rate and Rhythm: Normal rate and regular rhythm.     Pulses: Normal pulses.     Heart sounds: Normal heart sounds.  Pulmonary:     Effort: Pulmonary effort is normal.     Breath sounds: Normal breath sounds.  Neurological:     Mental Status: He is alert.    Assessment & Plan:   Problem List Items Addressed  This Visit       Respiratory   Acute sinusitis    -Rx. z-pack -ongoing x 7-10 days        Relevant Medications   cetirizine (ZYRTEC) 10 MG tablet   azithromycin (ZITHROMAX) 250 MG tablet     Other   Encounter to establish care - Primary    -transferring from a [facility] PCP; obtain records       Relevant Orders   CBC with Differential/Platelet   CMP14+EGFR   Lipid Panel With LDL/HDL Ratio   Screening due    -will screen for HCV and HIV with next set of labs       Relevant Orders   Hepatitis C Antibody   HIV antibody (with reflex)   S/P aortic valve replacement    -will need abx prior to dental/invasive procedures -he would like referral to local cardiologist; previously was seen by [facility] cardiology       Relevant Orders   Ambulatory referral to Cardiology    Outpatient Encounter Medications as of 10/04/2020  Medication Sig   azithromycin (ZITHROMAX) 250 MG tablet Please dispense as a z-pack   cetirizine (ZYRTEC) 10 MG tablet Take 10 mg by mouth daily.   hydrochlorothiazide (HYDRODIURIL) 25 MG tablet Take 25 mg by mouth daily.   metoprolol tartrate (LOPRESSOR) 25 MG tablet Take by mouth.   WARFARIN SODIUM PO Take 96m daily.   [DISCONTINUED] amoxicillin (AMOXIL) 500 MG capsule Take 2 capsules (1,000 mg total) by mouth 2 (two) times daily.   [DISCONTINUED] potassium chloride SA (KLOR-CON) 20 MEQ tablet Take by mouth.   No facility-administered encounter medications on file as of 10/04/2020.    Follow-up: Return in about 2 weeks (around 10/18/2020) for Physical Exam.   JNoreene Larsson NP

## 2020-10-04 NOTE — Patient Instructions (Signed)
Azithromycin is an antibiotic that does not interfere with coumadin as much as other antibiotics. However, you should call the coumadin clinic and let them know that you were started on a new medicine and see if they recommend changing your monitoring schedule.

## 2020-10-04 NOTE — Assessment & Plan Note (Addendum)
-  will need abx prior to dental/invasive procedures -he would like referral to local cardiologist; previously was seen by [facility] cardiology

## 2020-10-04 NOTE — Assessment & Plan Note (Signed)
-  Rx. z-pack -ongoing x 7-10 days

## 2020-10-09 ENCOUNTER — Encounter (HOSPITAL_COMMUNITY): Payer: Self-pay | Admitting: *Deleted

## 2020-10-09 ENCOUNTER — Emergency Department (HOSPITAL_COMMUNITY)
Admission: EM | Admit: 2020-10-09 | Discharge: 2020-10-09 | Disposition: A | Discharge status: Home / Self Care | Payer: Medicaid Other | Attending: Emergency Medicine | Admitting: Emergency Medicine

## 2020-10-09 ENCOUNTER — Other Ambulatory Visit: Payer: Self-pay

## 2020-10-09 DIAGNOSIS — F17210 Nicotine dependence, cigarettes, uncomplicated: Secondary | ICD-10-CM | POA: Diagnosis not present

## 2020-10-09 DIAGNOSIS — R519 Headache, unspecified: Secondary | ICD-10-CM

## 2020-10-09 DIAGNOSIS — J3489 Other specified disorders of nose and nasal sinuses: Secondary | ICD-10-CM | POA: Insufficient documentation

## 2020-10-09 MED ORDER — DEXAMETHASONE SODIUM PHOSPHATE 10 MG/ML IJ SOLN
10.0000 mg | Freq: Once | INTRAMUSCULAR | Status: AC
  Administered 2020-10-09: 10 mg via INTRAMUSCULAR
  Filled 2020-10-09: qty 1

## 2020-10-09 MED ORDER — TRAMADOL HCL 50 MG PO TABS: 50.0000 mg | ORAL_TABLET | Freq: Three times a day (TID) | ORAL | 0 refills | Status: AC

## 2020-10-09 MED ORDER — TRAMADOL HCL 50 MG PO TABS: 50.0000 mg | ORAL_TABLET | Freq: Three times a day (TID) | ORAL | 0 refills | Status: DC

## 2020-10-09 NOTE — Discharge Instructions (Addendum)
You have been seen and discharged from the emergency department.  Finish your Z-Pak as prescribed.  You were given a dose of steroids.  Take pain medicine as directed.  Do not mix this medication with alcohol or other sedating medications. Do not drive or do heavy physical activity and to know how this medication affects you.  It may cause drowsiness.  Follow-up with your primary provider for reevaluation and further care. Take home medications as prescribed. If you have any worsening symptoms or further concerns for your health please return to an emergency department for further evaluation.

## 2020-10-09 NOTE — ED Provider Notes (Signed)
[facility] EMERGENCY DEPARTMENT Provider Note   CSN: [id number] Arrival date & time: 10/09/20  0809     History Chief Complaint  Patient presents with   URI    Scott [name] is a 45 y.o. male.  HPI  46 year old male with past medical history of mechanical aortic valve replacement secondary to aortic stenosis who is anticoagulated on Coumadin presents the emergency department with ongoing sinus congestion/pain.  Patient states about 2 weeks ago he developed sinus pressure, thick green snot, congestion.  He was recently placed on a Z-Pak and has 2 days left.  He presents today because he continues to have ongoing right-sided headache that is not significantly improved with Tylenol.  He denies any fever, blurred vision, numbness/tingling, neck pain.  The headache is not disrupting his sleep, not worse in the morning.  Past Medical History:  Diagnosis Date   Anxiety    Aortic stenosis    Murmur, cardiac    Pancreatitis, acute 01/28/2017    Patient Active Problem List   Diagnosis Date Noted   Encounter to establish care 10/04/2020   Screening due 10/04/2020   S/P aortic valve replacement 10/04/2020   Acute sinusitis 10/04/2020   Marijuana user 01/28/2017    Past Surgical History:  Procedure Laterality Date   AORTIC VALVE REPAIR     when pt was 45 years old   CARDIAC SURGERY     FINGER SURGERY     MECHANICAL AORTIC VALVE REPLACEMENT     pt has card with mechanical valve replacemnt details   RIGHT/LEFT HEART CATH AND CORONARY ANGIOGRAPHY Bilateral 09/17/2018   Procedure: RIGHT/LEFT HEART CATH AND CORONARY ANGIOGRAPHY;  Surgeon: Marcina Millard, MD;  Location: [facility] INVASIVE [facility];  Service: Cardiovascular;  Laterality: Bilateral;       History reviewed. No pertinent family history.  Social History   Tobacco Use   Smoking status: Every Day    Packs/day: 1.50    Years: 30.00    Pack years: 45.00    Types: Cigarettes   Smokeless tobacco: Never  Vaping Use    Vaping Use: Never used  Substance Use Topics   Alcohol use: Not Currently    Comment: occ   Drug use: Yes    Types: Marijuana    Home Medications Prior to Admission medications   Medication Sig Start Date End Date Taking? Authorizing Provider  traMADol (ULTRAM) 50 MG tablet Take 1 tablet (50 mg total) by mouth every 8 (eight) hours for 3 days. 10/09/20 10/12/20 Yes Zulema Pulaski, Clabe Seal, DO  azithromycin (ZITHROMAX) 250 MG tablet Please dispense as a z-pack 10/04/20   Heather Roberts, NP  cetirizine (ZYRTEC) 10 MG tablet Take 10 mg by mouth daily.    [provider]  hydrochlorothiazide (HYDRODIURIL) 25 MG tablet Take 25 mg by mouth daily.    [provider]  metoprolol tartrate (LOPRESSOR) 25 MG tablet Take by mouth. 04/21/19 10/04/20  [provider]  WARFARIN SODIUM PO Take 15mg  daily.    [provider]    Allergies    Cocoa  Review of Systems   Review of Systems  Constitutional:  Negative for chills and fever.  HENT:  Positive for congestion, postnasal drip, rhinorrhea, sinus pressure, sinus pain and sneezing.   Eyes:  Negative for pain, redness and visual disturbance.  Respiratory:  Negative for shortness of breath.   Cardiovascular:  Negative for chest pain.  Gastrointestinal:  Negative for abdominal pain, diarrhea and vomiting.  Genitourinary:  Negative for dysuria.  Musculoskeletal:  Negative for neck pain and neck stiffness.  Skin:  Negative for rash.  Neurological:  Positive for headaches.   Physical Exam Updated Vital Signs BP 113/73   Pulse (!) 53   Temp 98.1 F (36.7 C) (Oral)   Resp 20   Ht 5\' 11"  (1.803 m)   Wt 77.1 kg   SpO2 100%   BMI 23.71 kg/m   Physical Exam Vitals and nursing note reviewed.  Constitutional:      General: He is not in acute distress.    Appearance: Normal appearance. He is not toxic-appearing.  HENT:     Head: Normocephalic.     Right Ear: Tympanic membrane normal.     Left Ear: Tympanic membrane  normal.     Nose: Congestion present.     Comments: TTP of maxillary sinuses    Mouth/Throat:     Mouth: Mucous membranes are moist.  Eyes:     Pupils: Pupils are equal, round, and reactive to light.  Cardiovascular:     Rate and Rhythm: Normal rate.  Pulmonary:     Effort: Pulmonary effort is normal. No respiratory distress.  Abdominal:     Palpations: Abdomen is soft.     Tenderness: There is no abdominal tenderness.  Musculoskeletal:     Cervical back: No rigidity.  Skin:    General: Skin is warm.  Neurological:     Mental Status: He is alert and oriented to person, place, and time. Mental status is at baseline.  Psychiatric:        Mood and Affect: Mood normal.    ED Results / Procedures / Treatments   Labs (all labs ordered are listed, but only abnormal results are displayed) Labs Reviewed - No data to display  EKG None  Radiology No results found.  Procedures Procedures   Medications Ordered in ED Medications  dexamethasone (DECADRON) injection 10 mg (has no administration in time range)    ED Course  I have reviewed the triage vital signs and the nursing notes.  Pertinent labs & imaging results that were available during my care of the patient were reviewed by me and considered in my medical decision making (see chart for details).    MDM Rules/Calculators/A&P                          45 year old male presents emergency department ongoing right sinus headache and congestion.  Has 2 more days left on Z-Pak.  Afebrile here, normal vitals.  Patient's exam is reassuring, no neurologic complaints.  He appears to be suffering from a sinus headache secondary to sinus infection for which she is currently being treated.  Low suspicion for ICH.  Low suspicion for other acute process like venous sinus thrombosis given that he is anticoagulated, no neuro symtpoms.  Patient is otherwise very well-appearing, baseline.  Plan to treat symptomatically and encouraged to  finish his Z-Pak, he has a follow-up appointment in the next 2 days with his primary doctor.  Patient will be discharged and treated as an outpatient.  Discharge plan and strict return to ED precautions discussed, patient verbalizes understanding and agreement.   Final Clinical Impression(s) / ED Diagnoses Final diagnoses:  Sinus headache    Rx / DC Orders ED Discharge Orders          Ordered    traMADol (ULTRAM) 50 MG tablet  Every 8 hours        10/09/20 0913  Rozelle Logan, [place] 10/09/20 [id number]

## 2020-10-09 NOTE — ED Triage Notes (Signed)
Pt c/o sinus congestion and pressure to face and nose x 2 weeks. He reports he was diagnosed with sinus infection recently and placed on Z-Pak but hasn't finished it yet. He reports the pain in worse now than when he started the Z-Pak. Denies cough, fever, nasal drainage.

## 2020-10-10 DIAGNOSIS — Z7901 Long term (current) use of anticoagulants: Secondary | ICD-10-CM | POA: Diagnosis not present

## 2020-10-15 ENCOUNTER — Emergency Department (HOSPITAL_COMMUNITY)
Admission: EM | Admit: 2020-10-15 | Discharge: 2020-10-15 | Disposition: A | Discharge status: Home / Self Care | Payer: Medicaid Other | Attending: Emergency Medicine | Admitting: Emergency Medicine

## 2020-10-15 ENCOUNTER — Encounter (HOSPITAL_COMMUNITY): Payer: Self-pay | Admitting: *Deleted

## 2020-10-15 ENCOUNTER — Other Ambulatory Visit: Payer: Self-pay

## 2020-10-15 ENCOUNTER — Emergency Department (HOSPITAL_COMMUNITY)
Admission: EM | Admit: 2020-10-15 | Discharge: 2020-10-16 | Disposition: A | Discharge status: Home / Self Care | Payer: Medicaid Other | Source: Home / Self Care | Attending: Emergency Medicine | Admitting: Emergency Medicine

## 2020-10-15 ENCOUNTER — Emergency Department (HOSPITAL_COMMUNITY): Payer: Medicaid Other

## 2020-10-15 DIAGNOSIS — J3489 Other specified disorders of nose and nasal sinuses: Secondary | ICD-10-CM | POA: Diagnosis not present

## 2020-10-15 DIAGNOSIS — H9201 Otalgia, right ear: Secondary | ICD-10-CM | POA: Diagnosis not present

## 2020-10-15 DIAGNOSIS — F17210 Nicotine dependence, cigarettes, uncomplicated: Secondary | ICD-10-CM | POA: Insufficient documentation

## 2020-10-15 DIAGNOSIS — R519 Headache, unspecified: Secondary | ICD-10-CM | POA: Insufficient documentation

## 2020-10-15 DIAGNOSIS — Z7901 Long term (current) use of anticoagulants: Secondary | ICD-10-CM | POA: Diagnosis not present

## 2020-10-15 DIAGNOSIS — K0889 Other specified disorders of teeth and supporting structures: Secondary | ICD-10-CM | POA: Insufficient documentation

## 2020-10-15 DIAGNOSIS — Z79899 Other long term (current) drug therapy: Secondary | ICD-10-CM | POA: Diagnosis not present

## 2020-10-15 MED ORDER — METOCLOPRAMIDE HCL 5 MG/ML IJ SOLN
10.0000 mg | Freq: Once | INTRAMUSCULAR | Status: AC
  Administered 2020-10-15: 10 mg via INTRAMUSCULAR
  Filled 2020-10-15: qty 2

## 2020-10-15 MED ORDER — MOMETASONE FUROATE 50 MCG/ACT NA SUSP: 2.0000 | Freq: Every day | NASAL | 0 refills | Status: DC

## 2020-10-15 MED ORDER — AMOXICILLIN-POT CLAVULANATE 875-125 MG PO TABS: 1.0000 | ORAL_TABLET | Freq: Two times a day (BID) | ORAL | 0 refills | Status: DC

## 2020-10-15 MED ORDER — KETOROLAC TROMETHAMINE 60 MG/2ML IM SOLN
60.0000 mg | Freq: Once | INTRAMUSCULAR | Status: AC
  Administered 2020-10-15: 60 mg via INTRAMUSCULAR
  Filled 2020-10-15: qty 2

## 2020-10-15 MED ORDER — DIPHENHYDRAMINE HCL 25 MG PO CAPS
25.0000 mg | ORAL_CAPSULE | Freq: Once | ORAL | Status: AC
  Administered 2020-10-15: 25 mg via ORAL
  Filled 2020-10-15: qty 1

## 2020-10-15 NOTE — ED Triage Notes (Signed)
Pt c/o continued sinus and face pressure, drainage, ear pain, sore throat x 1 month. Pt reports he was seen by his PCP over 2 weeks ago and put on Z-Pak, but the symptoms didn't get any better. He then came to the ED on 10/09/20 and was given steroid shot which he reports "knocked the pain out completely until 2 days ago". Pt was also given Tramadol for the pain but reports he has since ran out of the medication and the pain is back.

## 2020-10-15 NOTE — ED Provider Notes (Signed)
[facility] EMERGENCY DEPARTMENT Provider Note   CSN: [id number] Arrival date & time: 10/15/20  2355     History No chief complaint on file.   Yulian Gosney is a 45 y.o. male.  HPI      Errol Ala is a 45 y.o. male with past medical history of aortic stenosis with aortic valve replacement, anticoagulated on warfarin.  He presents to the Emergency Department complaining of recurrent nasal congestion, sinus pressure and headache.  Symptoms initially began 2 weeks ago.  He was seen by his PCP prescribed a Z-Pak without improvement.  He was seen in the emergency department here on 10/09/2020 given a injection of steroids and a prescription for Ultram.  He reports resolution of his symptoms until 2 days ago.  He describes a recurrent pressure pain sensation to the right side of his face that radiates to his ear.  He reports hearing crackling sounds from his right ear when he blows his nose.  Symptoms associated with dental pain as well.  He reports blowing some greenish-yellow congestion from his nose and postnasal drip.  Also states he has difficulty breathing through his nose.  He has been using Flonase without relief.  He denies any visual changes, fever, chills, numbness or tingling and neck pain or stiffness.   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  Social History   Tobacco Use   Smoking status: Every Day    Packs/day: 1.50    Years: 30.00    Pack years: 45.00    Types: Cigarettes   Smokeless tobacco: Never  Vaping Use   Vaping Use: Never used  Substance Use Topics   Alcohol use: Not Currently    Comment: occ   Drug use: Yes    Types: Marijuana    Home Medications Prior to Admission medications   Medication Sig Start Date End Date Taking? Authorizing Provider  azithromycin (ZITHROMAX) 250 MG tablet Please dispense as a z-pack 10/04/20   Heather Roberts, NP  cetirizine (ZYRTEC) 10 MG tablet Take 10 mg by mouth daily.    [provider]  hydrochlorothiazide (HYDRODIURIL) 25 MG tablet Take 25 mg by mouth daily.    [provider]  metoprolol tartrate (LOPRESSOR) 25 MG tablet Take by mouth. 04/21/19 10/04/20  [provider]  WARFARIN SODIUM PO Take 15mg  daily.    [provider]    Allergies    Cocoa  Review of Systems   Review of Systems  Constitutional:  Negative for chills, fatigue and fever.  HENT:  Positive for congestion,  sinus pressure, sinus pain and sore throat.   Eyes:  Positive for photophobia. Negative for visual disturbance.  Respiratory:  Negative for cough and shortness of breath.   Cardiovascular:  Negative for chest pain.  Gastrointestinal:  Negative for abdominal pain, nausea and vomiting.  Genitourinary:  Negative for dysuria, flank pain and hematuria.  Musculoskeletal:  Negative for arthralgias, back pain, myalgias, neck pain and neck stiffness.  Skin:  Negative for rash.  Neurological:  Positive for headaches. Negative for dizziness, syncope, facial asymmetry, speech difficulty, weakness and numbness.  Hematological:  Does not bruise/bleed easily.   Psychiatric/Behavioral:  Negative for confusion.    Physical Exam Updated Vital Signs BP 121/74 (BP Location: Right Arm)   Pulse (!) 57   Temp 98.1 F (36.7 C) (Oral)   Resp 18   Ht 5\' 11"  (1.803 m)   Wt 77.1 kg   SpO2 99%   BMI 23.71 kg/m   Physical Exam Vitals and nursing note reviewed.  Constitutional:      General: He is not in acute distress.    Appearance: Normal appearance.  HENT:     Head: Atraumatic.     Right Ear: Tympanic membrane and ear canal normal.     Left Ear: Tympanic membrane and ear canal normal.     Nose:     Right Turbinates: Swollen.     Left Turbinates: Not swollen.     Right Sinus: Maxillary sinus tenderness present.     Mouth/Throat:     Mouth: Mucous membranes are moist.  Eyes:     Extraocular Movements: Extraocular movements intact.     Conjunctiva/sclera: Conjunctivae normal.     Pupils: Pupils are equal, round, and reactive to light.  Neck:     Meningeal: Kernig's sign absent.  Cardiovascular:     Rate and Rhythm: Normal rate and regular rhythm.     Pulses: Normal pulses.  Pulmonary:     Effort: Pulmonary effort is normal.     Breath sounds: Normal breath sounds.  Abdominal:     Palpations: Abdomen is soft.     Tenderness: There is no abdominal tenderness.  Musculoskeletal:        General: Normal range of motion.     Cervical back: Full passive range of motion without pain and normal range of motion.     Right lower leg: No edema.     Left lower leg: No edema.  Lymphadenopathy:     Cervical: No cervical adenopathy.  Skin:    General: Skin is warm.     Capillary Refill: Capillary refill takes less than 2 seconds.     Findings: No rash.  Neurological:     General: No focal deficit present.     Mental Status: He is alert.     Sensory: No sensory deficit.     Motor: No weakness.    ED Results / Procedures / Treatments   Labs (all labs ordered are listed, but only abnormal results are displayed) Labs Reviewed - No data to  display  EKG None  Radiology CT Head Wo Contrast  Result Date: 10/15/2020 CLINICAL DATA:  Headache. EXAM: CT HEAD WITHOUT CONTRAST TECHNIQUE: Contiguous axial images were obtained from the base of the skull through the vertex without intravenous contrast. COMPARISON:  None. FINDINGS: Brain: No evidence of acute infarction, hemorrhage, hydrocephalus, extra-axial collection or mass lesion/mass effect. Vascular: No hyperdense vessel or unexpected calcification. Skull: Normal. Negative for fracture or focal lesion. Sinuses/Orbits: No acute finding. Other: None.  IMPRESSION: No definite intracranial abnormality seen on this unenhanced CT scan of the head. If there is clinical concern for dural sinus thrombosis, further evaluation with CT or MRI with contrast administration is recommended. Electronically Signed   By: Lupita Raider M.D.   On: 10/15/2020 12:23    Procedures Procedures   Medications Ordered in ED Medications  ketorolac (TORADOL) injection 60 mg (60 mg Intramuscular Given 10/15/20 1227)  diphenhydrAMINE (BENADRYL) capsule 25 mg (25 mg Oral Given 10/15/20 1225)  metoCLOPramide (REGLAN) injection 10 mg (10 mg Intramuscular Given 10/15/20 1230)    ED Course  I have reviewed the triage vital signs and the nursing notes.  Pertinent labs & imaging results that were available during my care of the patient were reviewed by me and considered in my medical decision making (see chart for details).    MDM Rules/Calculators/A&P                          Patient here with recurrent sinus pressure and pain.  He endorses nasal congestion and facial pressure with dental pain.  Symptoms have been persistent for 1 month.  Worse for 1 week.  He completed course of azithromycin.  Was seen here on 10/10/2020 given steroid injection here and prescription for Ultram.  Symptoms had resolved until 2 days ago when he ran out of pain medication.  He denies any new symptoms.  No fever, vision loss, facial swelling or  weakness.  Doubt venous sinus thrombosis given that patient is anticoagulated on Coumadin.  Noncon CT head without acute findings. Patient given migraine cocktail here and on recheck reports improvement of his symptoms.  I feel that he is appropriate for discharge home, will prescribe Augmentin and Nasonex for his symptoms.  Discussed that he may see a slight elevation in his INR from the Toradol given today and also recommended that he avoid decongestants.  He agrees to have INR rechecked next week.  Strict return precautions discussed.   Final Clinical Impression(s) / ED Diagnoses Final diagnoses:  Sinus headache    Rx / DC Orders ED Discharge Orders     None        Pauline Aus, PA-C 10/15/20 1408    Terald Sleeper, MD 10/15/20 [phone]

## 2020-10-15 NOTE — Discharge Instructions (Addendum)
Take the antibiotic as directed until it is finished.  You may also try the prescription Nasonex as directed.  Follow-up with your primary care provider next week for recheck.  Also have your INR level rechecked next week.  Return to the emergency department for any new or worsening symptoms.

## 2020-10-15 NOTE — ED Notes (Signed)
Patient transported to CT 

## 2020-10-15 NOTE — ED Triage Notes (Signed)
Patient was seen here earlier today for sinus infection and sinus pain.  Patient stated that he picked up prescription, but per pharmacy they did not receive  mometasone. Patient endorses pain 10/10 currently states as pressure and throbbing.

## 2020-10-16 MED ORDER — DEXAMETHASONE SODIUM PHOSPHATE 10 MG/ML IJ SOLN
10.0000 mg | Freq: Once | INTRAMUSCULAR | Status: AC
  Administered 2020-10-16: 10 mg via INTRAMUSCULAR
  Filled 2020-10-16: qty 1

## 2020-10-16 MED ORDER — HYDROCODONE-ACETAMINOPHEN 5-325 MG PO TABS
1.0000 | ORAL_TABLET | Freq: Once | ORAL | Status: AC
  Administered 2020-10-16: 1 via ORAL
  Filled 2020-10-16: qty 1

## 2020-10-16 MED ORDER — FLUTICASONE PROPIONATE 50 MCG/ACT NA SUSP
1.0000 | Freq: Every day | NASAL | Status: DC
  Administered 2020-10-16: 1 via NASAL
  Filled 2020-10-16: qty 16

## 2020-10-16 NOTE — Discharge Instructions (Addendum)
You were seen today for ongoing symptoms related to sinus headache.  Take medications as prescribed.  If you develop new or worsening symptoms, you should be reevaluated.

## 2020-10-16 NOTE — ED Provider Notes (Signed)
[facility] EMERGENCY DEPARTMENT Provider Note   CSN: [id number] Arrival date & time: 10/15/20  2323     History Chief Complaint  Patient presents with   Facial Pain    Scott [name] is a 45 y.o. male.  HPI     Patient presents with ongoing facial pain and headache.  Patient was seen and evaluated earlier today.  He was diagnosed with a sinus infection and headache.  He was discharged with Augmentin and Nasonex.  He picked up the Augmentin from the pharmacy but the Nasonex was not available.  He states after leaving the hospital, he did have resolution of his pain.  He went home and slept for approximately 4 hours.  However, he has redeveloped ongoing pain in his face.  It is mostly over the right side.  It involves his teeth, frontal and maxillary sinus and right ear.  He has not had any fevers.  Patient has been seen and evaluated previously for this.  He initially finished a course of azithromycin and was given a steroid shot.  He states he felt the steroid shot was very helpful.  He is on Coumadin for a mechanical valve and cannot take NSAIDs.  He reports taking Tylenol at home with no relief.  He rates his pain a 10 out of 10.  Otherwise nothing else is changed from his presentation.  Patient had a CT scan at his most recent visit which was negative.  Past Medical History:  Diagnosis Date   Anxiety    Aortic stenosis    Murmur, cardiac    Pancreatitis, acute 01/28/2017    Patient Active Problem List   Diagnosis Date Noted   Encounter to establish care 10/04/2020   Screening due 10/04/2020   S/P aortic valve replacement 10/04/2020   Acute sinusitis 10/04/2020   Marijuana user 01/28/2017    Past Surgical History:  Procedure Laterality Date   AORTIC VALVE REPAIR     when pt was 45 years old   CARDIAC SURGERY     FINGER SURGERY     MECHANICAL AORTIC VALVE REPLACEMENT     pt has card with mechanical valve replacemnt details   RIGHT/LEFT HEART CATH AND CORONARY  ANGIOGRAPHY Bilateral 09/17/2018   Procedure: RIGHT/LEFT HEART CATH AND CORONARY ANGIOGRAPHY;  Surgeon: Marcina Millard, MD;  Location: [facility] INVASIVE CV LAB;  Service: Cardiovascular;  Laterality: Bilateral;       No family history on file.  Social History   Tobacco Use   Smoking status: Every Day    Packs/day: 1.50    Years: 30.00    Pack years: 45.00    Types: Cigarettes   Smokeless tobacco: Never  Vaping Use   Vaping Use: Never used  Substance Use Topics   Alcohol use: Not Currently    Comment: occ   Drug use: Yes    Types: Marijuana    Home Medications Prior to Admission medications   Medication Sig Start Date End Date Taking? Authorizing Provider  amoxicillin-clavulanate (AUGMENTIN) 875-125 MG tablet Take 1 tablet by mouth every 12 (twelve) hours. 10/15/20   Pauline Aus, PA-C  azithromycin (ZITHROMAX) 250 MG tablet Please dispense as a z-pack 10/04/20   Heather Roberts, NP  cetirizine (ZYRTEC) 10 MG tablet Take 10 mg by mouth daily.    [provider]  hydrochlorothiazide (HYDRODIURIL) 25 MG tablet Take 25 mg by mouth daily.    [provider]  metoprolol tartrate (LOPRESSOR) 25 MG tablet Take by mouth. 04/21/19 10/04/20  [provider]  mometasone (NASONEX) 50 MCG/ACT nasal spray Place 2 sprays into the nose daily. 10/15/20   Triplett, Tammy, PA-C  WARFARIN SODIUM PO Take 15mg  daily.    [provider]    Allergies    Cocoa  Review of Systems   Review of Systems  Constitutional:  Negative for fever.  HENT:  Positive for ear pain, sinus pressure and sinus pain.   Gastrointestinal:  Negative for vomiting.  Neurological:  Positive for headaches. Negative for weakness and numbness.  All other systems reviewed and are negative.  Physical Exam Updated Vital Signs BP 130/70 (BP Location: Right Arm)   Pulse 61   Temp 98.5 F (36.9 C) (Oral)   Resp 18   Ht 1.803 m (5\' 11" )   Wt 77.1 kg   SpO2 98%   BMI 23.71 kg/m   Physical  Exam Vitals and nursing note reviewed.  Constitutional:      Appearance: He is well-developed. He is not ill-appearing.  HENT:     Head: Normocephalic and atraumatic.     Comments: Tenderness to palpation right maxillary sinus, generalized poor dentition without obvious abscess, no trismus    Ears:     Comments: Right TM full with preserved light reflex, effusion noted    Mouth/Throat:     Mouth: Mucous membranes are moist.     Comments: Uvula midline Eyes:     Pupils: Pupils are equal, round, and reactive to light.  Cardiovascular:     Rate and Rhythm: Normal rate and regular rhythm.     Heart sounds: Murmur heard.  Pulmonary:     Effort: Pulmonary effort is normal. No respiratory distress.     Breath sounds: Normal breath sounds. No wheezing.  Abdominal:     Palpations: Abdomen is soft.     Tenderness: There is no abdominal tenderness.  Musculoskeletal:     Cervical back: Normal range of motion and neck supple.     Right lower leg: No edema.     Left lower leg: No edema.  Lymphadenopathy:     Cervical: No cervical adenopathy.  Skin:    General: Skin is warm and dry.  Neurological:     Mental Status: He is alert and oriented to person, place, and time.  Psychiatric:        Mood and Affect: Mood normal.    ED Results / Procedures / Treatments   Labs (all labs ordered are listed, but only abnormal results are displayed) Labs Reviewed - No data to display  EKG None  Radiology CT Head Wo Contrast  Result Date: 10/15/2020 CLINICAL DATA:  Headache. EXAM: CT HEAD WITHOUT CONTRAST TECHNIQUE: Contiguous axial images were obtained from the base of the skull through the vertex without intravenous contrast. COMPARISON:  None. FINDINGS: Brain: No evidence of acute infarction, hemorrhage, hydrocephalus, extra-axial collection or mass lesion/mass effect. Vascular: No hyperdense vessel or unexpected calcification. Skull: Normal. Negative for fracture or focal lesion. Sinuses/Orbits:  No acute finding. Other: None. IMPRESSION: No definite intracranial abnormality seen on this unenhanced CT scan of the head. If there is clinical concern for dural sinus thrombosis, further evaluation with CT or MRI with contrast administration is recommended. Electronically Signed   By: M.D.   On: 10/15/2020 12:23    Procedures Procedures   Medications Ordered in ED Medications  HYDROcodone-acetaminophen (NORCO/VICODIN) 5-325 MG per tablet 1 tablet (has no administration in time range)  dexamethasone (DECADRON) injection 10 mg (has no administration  in time range)  fluticasone (FLONASE) 50 MCG/ACT nasal spray 1 spray (has no administration in time range)    ED Course  I have reviewed the triage vital signs and the nursing notes.  Pertinent labs & imaging results that were available during my care of the patient were reviewed by me and considered in my medical decision making (see chart for details).    MDM Rules/Calculators/A&P                          Patient presents with ongoing headache consistent with sinus headache.  Previously was evaluated and had a head CT that was negative.  He is overall nontoxic and vital signs are reassuring.  INR was supratherapeutic on last recheck.  With head CT negative for acute bleed, would also have low suspicion for thrombosis given recent high levels of INR.  His history is very consistent with sinus headache.  He was given a Norco and Flonase was ordered from pharmacy.  He has requested another steroid injection.  He has not had a steroid in several weeks.  He was given IM Decadron.  Recommend ongoing supportive measures at home as well.  Given that he is afebrile and otherwise nontoxic-appearing with reassuring exam, low suspicion for meningitis or other cause of headache.     After history, exam, and medical workup I feel the patient has been appropriately medically screened and is safe for discharge home. Pertinent diagnoses were  discussed with the patient. Patient was given return precautions.  Final Clinical Impression(s) / ED Diagnoses Final diagnoses:  Sinus headache    Rx / DC Orders ED Discharge Orders     None        Rudolph Daoust, Mayer Masker, MD 10/16/20 [phone]

## 2020-10-18 ENCOUNTER — Other Ambulatory Visit (HOSPITAL_COMMUNITY)
Admission: RE | Admit: 2020-10-18 | Discharge: 2020-10-18 | Disposition: A | Discharge status: Home / Self Care | Payer: Medicaid Other | Source: Ambulatory Visit | Attending: Nurse Practitioner | Admitting: Nurse Practitioner

## 2020-10-18 ENCOUNTER — Encounter: Payer: Self-pay | Admitting: Nurse Practitioner

## 2020-10-18 ENCOUNTER — Ambulatory Visit (INDEPENDENT_AMBULATORY_CARE_PROVIDER_SITE_OTHER): Payer: Medicaid Other | Admitting: Nurse Practitioner

## 2020-10-18 ENCOUNTER — Encounter (HOSPITAL_COMMUNITY)
Admission: RE | Admit: 2020-10-18 | Discharge: 2020-10-18 | Disposition: A | Discharge status: Home / Self Care | Payer: Medicaid Other | Source: Ambulatory Visit | Attending: Cardiology | Admitting: Cardiology

## 2020-10-18 ENCOUNTER — Other Ambulatory Visit: Payer: Self-pay

## 2020-10-18 VITALS — BP 93/61 | HR 66 | Temp 98.5°F | Ht 71.0 in | Wt 168.0 lb

## 2020-10-18 DIAGNOSIS — Z7689 Persons encountering health services in other specified circumstances: Secondary | ICD-10-CM | POA: Insufficient documentation

## 2020-10-18 DIAGNOSIS — J0100 Acute maxillary sinusitis, unspecified: Secondary | ICD-10-CM | POA: Diagnosis not present

## 2020-10-18 DIAGNOSIS — Z952 Presence of prosthetic heart valve: Secondary | ICD-10-CM | POA: Insufficient documentation

## 2020-10-18 DIAGNOSIS — Z139 Encounter for screening, unspecified: Secondary | ICD-10-CM | POA: Insufficient documentation

## 2020-10-18 DIAGNOSIS — E782 Mixed hyperlipidemia: Secondary | ICD-10-CM | POA: Diagnosis not present

## 2020-10-18 DIAGNOSIS — Z0001 Encounter for general adult medical examination with abnormal findings: Secondary | ICD-10-CM | POA: Diagnosis not present

## 2020-10-18 DIAGNOSIS — R7301 Impaired fasting glucose: Secondary | ICD-10-CM

## 2020-10-18 LAB — LIPID PANEL
Cholesterol: 180 mg/dL (ref 0–200)
HDL: 38 mg/dL — ABNORMAL LOW (ref >40)
LDL Cholesterol: 106 mg/dL — ABNORMAL HIGH (ref 0–99)
Total CHOL/HDL Ratio: 4.7 RATIO
Triglycerides: 179 mg/dL — ABNORMAL HIGH (ref <150)
VLDL: 36 mg/dL (ref 0–40)

## 2020-10-18 LAB — COMPREHENSIVE METABOLIC PANEL
ALT: 27 U/L (ref 0–44)
AST: 24 U/L (ref 15–41)
Albumin: 4 g/dL (ref 3.5–5.0)
Alkaline Phosphatase: 79 U/L (ref 38–126)
Anion gap: 8 (ref 5–15)
BUN: 19 mg/dL (ref 6–20)
CO2: 26 mmol/L (ref 22–32)
Calcium: 9 mg/dL (ref 8.9–10.3)
Chloride: 103 mmol/L (ref 98–111)
Creatinine, Ser: 1.06 mg/dL (ref 0.61–1.24)
GFR, Estimated: >60 mL/min (ref >60)
Glucose, Bld: 108 mg/dL — ABNORMAL HIGH (ref 70–99)
Potassium: 3.6 mmol/L (ref 3.5–5.1)
Sodium: 137 mmol/L (ref 135–145)
Total Bilirubin: 0.5 mg/dL (ref 0.3–1.2)
Total Protein: 6.8 g/dL (ref 6.5–8.1)

## 2020-10-18 LAB — CBC WITH DIFFERENTIAL/PLATELET
Abs Immature Granulocytes: 0.03 10*3/uL (ref 0.00–0.07)
Basophils Absolute: 0.1 10*3/uL (ref 0.0–0.1)
Basophils Relative: 1 %
Eosinophils Absolute: 0.2 10*3/uL (ref 0.0–0.5)
Eosinophils Relative: 2 %
HCT: 43 % (ref 39.0–52.0)
Hemoglobin: 14.5 g/dL (ref 13.0–17.0)
Immature Granulocytes: 0 %
Lymphocytes Relative: 30 %
Lymphs Abs: 2.8 10*3/uL (ref 0.7–4.0)
MCH: 31.9 pg (ref 26.0–34.0)
MCHC: 33.7 g/dL (ref 30.0–36.0)
MCV: 94.7 fL (ref 80.0–100.0)
Monocytes Absolute: 0.8 10*3/uL (ref 0.1–1.0)
Monocytes Relative: 9 %
Neutro Abs: 5.3 10*3/uL (ref 1.7–7.7)
Neutrophils Relative %: 58 %
Platelets: 266 10*3/uL (ref 150–400)
RBC: 4.54 MIL/uL (ref 4.22–5.81)
RDW: 14.1 % (ref 11.5–15.5)
WBC: 9.2 10*3/uL (ref 4.0–10.5)
nRBC: 0 % (ref 0.0–0.2)

## 2020-10-18 LAB — HEPATITIS C ANTIBODY: HCV Ab: NONREACTIVE

## 2020-10-18 LAB — HIV ANTIBODY (ROUTINE TESTING W REFLEX): HIV Screen 4th Generation wRfx: NONREACTIVE

## 2020-10-18 LAB — PROTIME-INR
INR: 5 (ref 0.8–1.2)
Prothrombin Time: 46.3 seconds — ABNORMAL HIGH (ref 11.4–15.2)

## 2020-10-18 NOTE — Progress Notes (Signed)
HIV and HCV were negative.

## 2020-10-18 NOTE — Patient Instructions (Signed)
Please have fasting labs drawn 2-3 days prior to your appointment so we can discuss the results during your office visit.  

## 2020-10-18 NOTE — Assessment & Plan Note (Signed)
-  tried a z-pack -taking augmentin now; getting better slowly -if no improvement in 1 week, will consider ENT consult

## 2020-10-18 NOTE — Progress Notes (Signed)
Established Patient Office Visit  Subjective:  Patient ID: Scott [name], male    DOB: 19-Jan-1976  Age: 45 y.o. MRN: [id number]  CC:  Chief Complaint  Patient presents with   Annual Exam    CPE   Sinusitis    Follow up. Has been to the ER twice for this. Still not feeling any better.    HPI Scott [name] presents for physical exam. He has sinus congestion and was treated with z-pack at last OV. He finished that and went to ED x2, and was treated with augmentin. His head CT was negative, and he was given IM Decadron.  He has "stabbing pain" in his ear, intense pressure in the right side of his head- eye, jaw, and ear. He is taking augmentin and has been feeling somewhat better. He started augmentin 2 days ago.  Past Medical History:  Diagnosis Date   Anxiety    Aortic stenosis    Murmur, cardiac    Pancreatitis, acute 01/28/2017    Past Surgical History:  Procedure Laterality Date   AORTIC VALVE REPAIR     when pt was 45 years old   CARDIAC SURGERY     FINGER SURGERY     MECHANICAL AORTIC VALVE REPLACEMENT     pt has card with mechanical valve replacemnt details   RIGHT/LEFT HEART CATH AND CORONARY ANGIOGRAPHY Bilateral 09/17/2018   Procedure: RIGHT/LEFT HEART CATH AND CORONARY ANGIOGRAPHY;  Surgeon: Isaias Cowman, MD;  Location: [facility];  Service: Cardiovascular;  Laterality: Bilateral;    History reviewed. No pertinent family history.  Social History   Socioeconomic History   Marital status: Married    Spouse name: Not on file   Number of children: 5   Years of education: Not on file   Highest education level: Not on file  Occupational History   Occupation: Press photographer    Comment: Dealer, services [employer]  Tobacco Use   Smoking status: Every Day    Packs/day: 1.50    Years: 30.00    Pack years: 45.00    Types: Cigarettes   Smokeless tobacco: Never  Vaping Use   Vaping Use: Never used  Substance and Sexual Activity   Alcohol  use: Not Currently    Comment: occ   Drug use: Yes    Types: Marijuana   Sexual activity: Yes  Other Topics Concern   Not on file  Social History Narrative   Not on file   Social Determinants of Health   Financial Resource Strain: Not on file  Food Insecurity: Not on file  Transportation Needs: Not on file  Physical Activity: Not on file  Stress: Not on file  Social Connections: Not on file  Intimate Partner Violence: Not on file    Outpatient Medications Prior to Visit  Medication Sig Dispense Refill   amoxicillin-clavulanate (AUGMENTIN) 875-125 MG tablet Take 1 tablet by mouth every 12 (twelve) hours. 14 tablet 0   cetirizine (ZYRTEC) 10 MG tablet Take 10 mg by mouth daily.     hydrochlorothiazide (HYDRODIURIL) 25 MG tablet Take 25 mg by mouth daily.     mometasone (NASONEX) 50 MCG/ACT nasal spray Place 2 sprays into the nose daily. 1 each 0   WARFARIN SODIUM PO Take 4m daily.     metoprolol tartrate (LOPRESSOR) 25 MG tablet Take by mouth.     azithromycin (ZITHROMAX) 250 MG tablet Please dispense as a z-pack (Patient not taking: Reported on 10/18/2020) 6 tablet 0   No  facility-administered medications prior to visit.    Allergies  Allergen Reactions   Cocoa Hives    Pt sts he is allergic to Coco Puffs    ROS Review of Systems  Constitutional: Negative.   HENT: Negative.    Eyes: Negative.   Respiratory: Negative.    Cardiovascular: Negative.   Gastrointestinal: Negative.   Endocrine: Negative.   Genitourinary: Negative.   Musculoskeletal: Negative.   Skin: Negative.   Allergic/Immunologic: Negative.   Neurological: Negative.   Hematological: Negative.   Psychiatric/Behavioral: Negative.       Objective:    Physical Exam Constitutional:      Appearance: Normal appearance.  HENT:     Head: Normocephalic and atraumatic.     Right Ear: Tympanic membrane, ear canal and external ear normal.     Left Ear: Tympanic membrane, ear canal and external ear  normal.     Nose: Nose normal.     Mouth/Throat:     Mouth: Mucous membranes are moist.     Pharynx: Oropharynx is clear.  Eyes:     Extraocular Movements: Extraocular movements intact.     Conjunctiva/sclera: Conjunctivae normal.     Pupils: Pupils are equal, round, and reactive to light.  Cardiovascular:     Rate and Scott: Normal rate and regular Scott.     Pulses: Normal pulses.  Pulmonary:     Effort: Pulmonary effort is normal.     Breath sounds: Normal breath sounds.  Abdominal:     General: Abdomen is flat. Bowel sounds are normal.     Palpations: Abdomen is soft.  Musculoskeletal:        General: Normal range of motion.     Cervical back: Normal range of motion and neck supple.  Skin:    General: Skin is warm and dry.     Capillary Refill: Capillary refill takes less than 2 seconds.  Neurological:     General: No focal deficit present.     Mental Status: He is alert and oriented to person, place, and time.  Psychiatric:        Mood and Affect: Mood normal.        Behavior: Behavior normal.        Thought Content: Thought content normal.        Judgment: Judgment normal.    BP 93/61 (BP Location: Left Arm, Patient Position: Sitting, Cuff Size: Large)   Pulse 66   Temp 98.5 F (36.9 C) (Temporal)   Ht '5\' 11"'  (1.803 m)   Wt 168 lb (76.2 kg)   SpO2 96%   BMI 23.43 kg/m  Wt Readings from Last 3 Encounters:  10/18/20 168 lb (76.2 kg)  10/15/20 170 lb (77.1 kg)  10/15/20 170 lb (77.1 kg)     Health Maintenance Due  Topic Date Due   HIV Screening  Never done   Hepatitis C Screening  Never done    There are no preventive care reminders to display for this patient.  No results found for: TSH Lab Results  Component Value Date   WBC 9.2 10/18/2020   HGB 14.5 10/18/2020   HCT 43.0 10/18/2020   MCV 94.7 10/18/2020   PLT 266 10/18/2020   Lab Results  Component Value Date   NA 137 10/18/2020   K 3.6 10/18/2020   CO2 26 10/18/2020   GLUCOSE 108 (H)  10/18/2020   BUN 19 10/18/2020   CREATININE 1.06 10/18/2020   BILITOT 0.5 10/18/2020   ALKPHOS 79 10/18/2020  AST 24 10/18/2020   ALT 27 10/18/2020   PROT 6.8 10/18/2020   ALBUMIN 4.0 10/18/2020   CALCIUM 9.0 10/18/2020   ANIONGAP 8 10/18/2020   Lab Results  Component Value Date   CHOL 180 10/18/2020   Lab Results  Component Value Date   HDL 38 (L) 10/18/2020   Lab Results  Component Value Date   LDLCALC 106 (H) 10/18/2020   Lab Results  Component Value Date   TRIG 179 (H) 10/18/2020   Lab Results  Component Value Date   CHOLHDL 4.7 10/18/2020   No results found for: HGBA1C    Assessment & Plan:   Problem List Items Addressed This Visit       Respiratory   Acute sinusitis    -tried a z-pack -taking augmentin now; getting better slowly -if no improvement in 1 week, will consider ENT consult         Other   Encounter for general adult medical examination with abnormal findings - Primary    -right ear pain on physical exam, likely related to sinusitis       Screening due    -HCV and HIV have not yet resulted. Were drawn at [facility] instead of our office.       S/P aortic valve replacement    -followed by cardiology -INR hasn't resulted yet; goal 2-3       Other Visit Diagnoses     IFG (impaired fasting glucose)       Relevant Orders   Hemoglobin A1c   Mixed hyperlipidemia       Relevant Orders   CBC with Differential/Platelet   CMP14+EGFR   Lipid Panel With LDL/HDL Ratio       No orders of the defined types were placed in this encounter.   Follow-up: Return in about 6 months (around 04/20/2021) for Lab follow-up (IFG, HLD).    Noreene Larsson, NP

## 2020-10-18 NOTE — Assessment & Plan Note (Signed)
-  HCV and HIV have not yet resulted. Were drawn at [facility] instead of our office.

## 2020-10-18 NOTE — Progress Notes (Signed)
CBC is great.

## 2020-10-18 NOTE — Assessment & Plan Note (Signed)
-  right ear pain on physical exam, likely related to sinusitis

## 2020-10-18 NOTE — Progress Notes (Signed)
Cholesterol is slightly elevated. I will see him later today.

## 2020-10-18 NOTE — Assessment & Plan Note (Signed)
-  followed by cardiology -INR hasn't resulted yet; goal 2-3

## 2020-10-21 NOTE — Progress Notes (Signed)
Left message

## 2020-10-24 ENCOUNTER — Encounter (HOSPITAL_COMMUNITY)
Admission: RE | Admit: 2020-10-24 | Discharge: 2020-10-24 | Disposition: A | Discharge status: Home / Self Care | Payer: Medicaid Other | Source: Ambulatory Visit | Attending: Cardiology | Admitting: Cardiology

## 2020-10-24 DIAGNOSIS — Z952 Presence of prosthetic heart valve: Secondary | ICD-10-CM | POA: Diagnosis not present

## 2020-10-24 LAB — PROTIME-INR
INR: 1.2 (ref 0.8–1.2)
Prothrombin Time: 14.7 seconds (ref 11.4–15.2)

## 2020-11-15 ENCOUNTER — Encounter (HOSPITAL_COMMUNITY)
Admission: RE | Admit: 2020-11-15 | Discharge: 2020-11-15 | Disposition: A | Discharge status: Home / Self Care | Payer: Medicaid Other | Source: Ambulatory Visit | Attending: Cardiology | Admitting: Cardiology

## 2020-11-15 DIAGNOSIS — Z952 Presence of prosthetic heart valve: Secondary | ICD-10-CM | POA: Insufficient documentation

## 2020-11-15 LAB — PROTIME-INR
INR: 7.4 (ref 0.8–1.2)
Prothrombin Time: 63.1 seconds — ABNORMAL HIGH (ref 11.4–15.2)

## 2020-11-21 ENCOUNTER — Other Ambulatory Visit (HOSPITAL_COMMUNITY)
Admission: RE | Admit: 2020-11-21 | Discharge: 2020-11-21 | Disposition: A | Discharge status: Home / Self Care | Payer: Medicaid Other | Source: Ambulatory Visit | Attending: Cardiology | Admitting: Cardiology

## 2020-11-21 DIAGNOSIS — Z952 Presence of prosthetic heart valve: Secondary | ICD-10-CM | POA: Insufficient documentation

## 2020-11-21 LAB — PROTIME-INR
INR: 1 (ref 0.8–1.2)
Prothrombin Time: 13.3 seconds (ref 11.4–15.2)

## 2020-12-29 NOTE — Progress Notes (Signed)
Cardiology Office Note   Date:  01/01/2021   ID:  Scott [name], DOB 05-15-1975, MRN [id number]  PCP:  Heather Roberts, NP  Cardiologist:   Dietrich Pates, MD   Pt with AV disease PResents for follow up    History of Present Illness: Scott [name] is a 45 y.o. male with a history of coarctation of the aorta (s/p repair), L subclavian stenosis, congenital AS(s/p Merrilee Jansky prcedure at age 55 and [employer] AVR (10/2018), chest pain, PAF(post AVR surgery; on amiodarone), hydrothorax (postop),  COPD   Previously followed by Drs Fransico Michael ([facility]) and [facility] Note cath in 2020 presurgery showed no sigificant CAD    Seen in Jan 2022 Fransico Michael) with some L sided CP   Complained of some dyspnea   Metoprlol was cut back  HIgh Rec CT ordered   No signficant abnormalities     Pulmonary appt    The pt ran out of coumadin a couple days ago   Refill denied by [facility]     He presents today to estabilsh  Pt says he has intermitt L sided CP   Comes and goes  Can feel chest vibrate at times when he breathes   No other chest pains        Current Meds  Medication Sig   hydrochlorothiazide (HYDRODIURIL) 25 MG tablet Take 25 mg by mouth daily.   metoprolol succinate (TOPROL-XL) 25 MG 24 hr tablet Take 25 mg by mouth daily.   warfarin (COUMADIN) 10 MG tablet Take 1.5 tablets (15 mg total) by mouth daily.   [DISCONTINUED] WARFARIN SODIUM PO Take 15mg  daily.     Allergies:   Cocoa   Past Medical History:  Diagnosis Date   Anxiety    Aortic stenosis    Murmur, cardiac    Pancreatitis, acute 01/28/2017    Past Surgical History:  Procedure Laterality Date   AORTIC VALVE REPAIR     when pt was 45 years old   CARDIAC SURGERY     FINGER SURGERY     MECHANICAL AORTIC VALVE REPLACEMENT     pt has card with mechanical valve replacemnt details   RIGHT/LEFT HEART CATH AND CORONARY ANGIOGRAPHY Bilateral 09/17/2018   Procedure: RIGHT/LEFT HEART CATH AND CORONARY ANGIOGRAPHY;  Surgeon: 11/17/2018, MD;   Location: [facility];  Service: Cardiovascular;  Laterality: Bilateral;     Social History:  The patient  reports that he has been smoking cigarettes. He has a 45.00 pack-year smoking history. He has never used smokeless tobacco. He reports that he does not currently use alcohol. He reports current drug use. Drug: Marijuana.   Family History:  The patient's family history is not on file.    ROS:  Please see the history of present illness. All other systems are reviewed and  Negative to the above problem except as noted.    PHYSICAL EXAM: VS:  BP 106/62   Pulse 70   Ht 5\' 11"  (1.803 m)   Wt 162 lb 12.8 oz (73.8 kg)   BMI 22.71 kg/m   Marcina Millard 45 yo  in no acute distress  HEENT: normal  Neck: no JVD, No bruits  Cardiac: RRR;   + click   No rubs  No LE edema  Respiratory:  clear to auscultation bilaterally, normal work of breathing GI: soft, nontender, nondistended, + BS  No hepatomegaly  MS: no deformity Moving all extremities   Skin: warm and dry, no rash Neuro:  Strength and sensation are intact  Psych: euthymic mood, full affect   EKG:  EKG is ordered today.  SR 70   RBBB   LAFB   Lipid Panel    Component Value Date/Time   CHOL 174 12/30/2020 1537   TRIG 69 12/30/2020 1537   HDL 36 (L) 12/30/2020 1537   CHOLHDL 4.8 12/30/2020 1537   VLDL 14 12/30/2020 1537   LDLCALC 124 (H) 12/30/2020 1537      Wt Readings from Last 3 Encounters:  12/30/20 162 lb 12.8 oz (73.8 kg)  10/18/20 168 lb (76.2 kg)  10/15/20 170 lb (77.1 kg)      ASSESSMENT AND PLAN:  1  Congenital heart dz   pt with congenital AS and coarctation of aorta   Now s/p 2 surgeries, last with AVR in 2020     OUt of coumadin for a couple days Will refiell    Make appt to establish in coumadin clinic  Get echo here so images available for review  2  Chest pain    Pt with intermitt L sided pain   He points to region of anterior chest    Worse with deep breath.    No signif abnormaitlies on exam     Will set up for CXR  Consider pulmonary eval    3   Tobacco abuse   Pt continues to smoke   Counsellec on cessatoin    Plan for f/u in 6 months    Current medicines are reviewed at length with the patient today.  The patient does not have concerns regarding medicines.  Signed, Dietrich Pates, MD  01/01/2021 9:48 PM    [facility] [address] [place] [facility] [place], [place], [place]  [address] Phone: [phone]; Fax: [phone]

## 2020-12-30 ENCOUNTER — Ambulatory Visit: Payer: Medicaid Other | Admitting: Internal Medicine

## 2020-12-30 ENCOUNTER — Other Ambulatory Visit (HOSPITAL_COMMUNITY)
Admission: RE | Admit: 2020-12-30 | Discharge: 2020-12-30 | Disposition: A | Discharge status: Home / Self Care | Payer: Medicaid Other | Source: Ambulatory Visit | Attending: Internal Medicine | Admitting: Internal Medicine

## 2020-12-30 ENCOUNTER — Other Ambulatory Visit: Payer: Self-pay

## 2020-12-30 ENCOUNTER — Encounter: Payer: Self-pay | Admitting: Internal Medicine

## 2020-12-30 ENCOUNTER — Ambulatory Visit (HOSPITAL_COMMUNITY)
Admission: RE | Admit: 2020-12-30 | Discharge: 2020-12-30 | Disposition: A | Discharge status: Home / Self Care | Payer: Medicaid Other | Source: Ambulatory Visit | Attending: Internal Medicine | Admitting: Internal Medicine

## 2020-12-30 VITALS — BP 106/62 | HR 70 | Ht 71.0 in | Wt 162.8 lb

## 2020-12-30 DIAGNOSIS — Z79899 Other long term (current) drug therapy: Secondary | ICD-10-CM | POA: Diagnosis not present

## 2020-12-30 DIAGNOSIS — R0602 Shortness of breath: Secondary | ICD-10-CM

## 2020-12-30 DIAGNOSIS — J439 Emphysema, unspecified: Secondary | ICD-10-CM | POA: Diagnosis not present

## 2020-12-30 DIAGNOSIS — I10 Essential (primary) hypertension: Secondary | ICD-10-CM

## 2020-12-30 DIAGNOSIS — Z952 Presence of prosthetic heart valve: Secondary | ICD-10-CM | POA: Diagnosis not present

## 2020-12-30 LAB — LIPID PANEL
Cholesterol: 174 mg/dL (ref 0–200)
HDL: 36 mg/dL — ABNORMAL LOW (ref >40)
LDL Cholesterol: 124 mg/dL — ABNORMAL HIGH (ref 0–99)
Total CHOL/HDL Ratio: 4.8 RATIO
Triglycerides: 69 mg/dL (ref <150)
VLDL: 14 mg/dL (ref 0–40)

## 2020-12-30 LAB — PROTIME-INR
INR: 1.4 — ABNORMAL HIGH (ref 0.8–1.2)
Prothrombin Time: 17.4 seconds — ABNORMAL HIGH (ref 11.4–15.2)

## 2020-12-30 LAB — CBC
HCT: 41.1 % (ref 39.0–52.0)
Hemoglobin: 14.2 g/dL (ref 13.0–17.0)
MCH: 32.3 pg (ref 26.0–34.0)
MCHC: 34.5 g/dL (ref 30.0–36.0)
MCV: 93.6 fL (ref 80.0–100.0)
Platelets: 287 10*3/uL (ref 150–400)
RBC: 4.39 MIL/uL (ref 4.22–5.81)
RDW: 12.5 % (ref 11.5–15.5)
WBC: 10.3 10*3/uL (ref 4.0–10.5)
nRBC: 0 % (ref 0.0–0.2)

## 2020-12-30 LAB — BASIC METABOLIC PANEL
Anion gap: 6 (ref 5–15)
BUN: 20 mg/dL (ref 6–20)
CO2: 26 mmol/L (ref 22–32)
Calcium: 9 mg/dL (ref 8.9–10.3)
Chloride: 104 mmol/L (ref 98–111)
Creatinine, Ser: 1.02 mg/dL (ref 0.61–1.24)
GFR, Estimated: >60 mL/min (ref >60)
Glucose, Bld: 96 mg/dL (ref 70–99)
Potassium: 3.6 mmol/L (ref 3.5–5.1)
Sodium: 136 mmol/L (ref 135–145)

## 2020-12-30 LAB — BRAIN NATRIURETIC PEPTIDE: B Natriuretic Peptide: 27 pg/mL (ref 0.0–100.0)

## 2020-12-30 MED ORDER — WARFARIN SODIUM 10 MG PO TABS: 15.0000 mg | ORAL_TABLET | Freq: Every day | ORAL | 0 refills | Status: DC

## 2020-12-30 NOTE — Patient Instructions (Signed)
Medication Instructions:  Your physician recommends that you continue on your current medications as directed. Please refer to the Current Medication list given to you today.  *If you need a refill on your cardiac medications before your next appointment, please call your pharmacy*   Lab Work: Your physician recommends that you return for lab work in: Today ( CBC, BMET, Lipid, BNP, INR)    If you have labs (blood work) drawn today and your tests are completely normal, you will receive your results only by: MyChart Message (if you have MyChart) OR A paper copy in the mail If you have any lab test that is abnormal or we need to change your treatment, we will call you to review the results.   Testing/Procedures: A chest x-ray takes a picture of the organs and structures inside the chest, including the heart, lungs, and blood vessels. This test can show several things, including, whether the heart is enlarges; whether fluid is building up in the lungs; and whether pacemaker / defibrillator leads are still in place.   Your physician has requested that you have an echocardiogram. Echocardiography is a painless test that uses sound waves to create images of your heart. It provides your doctor with information about the size and shape of your heart and how well your heart's chambers and valves are working. This procedure takes approximately one hour. There are no restrictions for this procedure.    Follow-Up: At [facility], you and your health needs are our priority.  As part of our continuing mission to provide you with exceptional heart care, we have created designated Provider Care Teams.  These Care Teams include your primary Cardiologist (physician) and Advanced Practice Providers (APPs -  Physician Assistants and Nurse Practitioners) who all work together to provide you with the care you need, when you need it.  We recommend signing up for the patient portal called "MyChart".  Sign up  information is provided on this After Visit Summary.  MyChart is used to connect with patients for Virtual Visits (Telemedicine).  Patients are able to view lab/test results, encounter notes, upcoming appointments, etc.  Non-urgent messages can be sent to your provider as well.   To learn more about what you can do with MyChart, go to [email or web address].    Your next appointment:   6 month(s)  The format for your next appointment:   In Person  Provider:   Dietrich Pates, MD   Other Instructions Thank you for choosing [facility]!

## 2021-01-02 ENCOUNTER — Telehealth: Payer: Self-pay | Admitting: Internal Medicine

## 2021-01-02 DIAGNOSIS — R9389 Abnormal findings on diagnostic imaging of other specified body structures: Secondary | ICD-10-CM

## 2021-01-02 NOTE — Telephone Encounter (Signed)
[name], MD  01/01/2021 10:43 PM EDT     CXR shows evidence of emphysema.   Needs to stop smoking  There is a density on the R   ? What represents   Will need follow up WIth this and with his L sided CP I would recomm he be set up to seen in pulmonary clinic

## 2021-01-02 NOTE — Telephone Encounter (Signed)
[name]-spouse called stating that Mr. Pittinger had chest xray on Friday. Wanting to know if results are back.States that his shortness of breath has become worse over the weekend. They are in question about a referral to Pulmonary .  [phone].

## 2021-01-03 NOTE — Telephone Encounter (Signed)
I spoke with patient and discussed his cxr results. I placed referral to pulmonary and patient has apt with coumadin nurse Vashti Hey, RN tomorrow at our [facility] office.

## 2021-01-03 NOTE — Telephone Encounter (Signed)
Patient wife called he is feel a lot of worse and they would like to speak to you , do you know what his INR is

## 2021-01-04 ENCOUNTER — Telehealth: Payer: Self-pay | Admitting: *Deleted

## 2021-01-04 ENCOUNTER — Ambulatory Visit (INDEPENDENT_AMBULATORY_CARE_PROVIDER_SITE_OTHER): Payer: Medicaid Other | Admitting: *Deleted

## 2021-01-04 DIAGNOSIS — Z5181 Encounter for therapeutic drug level monitoring: Secondary | ICD-10-CM | POA: Diagnosis not present

## 2021-01-04 DIAGNOSIS — Z952 Presence of prosthetic heart valve: Secondary | ICD-10-CM | POA: Diagnosis not present

## 2021-01-04 DIAGNOSIS — Z13220 Encounter for screening for lipoid disorders: Secondary | ICD-10-CM

## 2021-01-04 LAB — POCT INR: INR: 1.4 — AB (ref 2.0–3.0)

## 2021-01-04 MED ORDER — ROSUVASTATIN CALCIUM 10 MG PO TABS: 10.0000 mg | ORAL_TABLET | Freq: Every day | ORAL | 3 refills | Status: DC

## 2021-01-04 NOTE — Telephone Encounter (Signed)
Pt notified and agrees with plan of care.  

## 2021-01-04 NOTE — Telephone Encounter (Signed)
-----   Message from [name], MD sent at 01/01/2021 10:45 PM EDT ----- CBC is normal Electrolytes normal, kidney function normal   FLuid normal INR is low 1.4   Jst restarted on coumadin   Follow up in coumadin clinic Lipids:   LDL 124   Given hx of heart surgiers I would recomm statin to lower so that he does not develp CAD in future   Would recomm Crestor 10 mg with f/u lipids and LFTs in 8 wks

## 2021-01-04 NOTE — Patient Instructions (Signed)
Take warfarin 20mg  tonight and tomorrow night then resume 15mg  daily Recheck on 01/09/21

## 2021-01-09 ENCOUNTER — Ambulatory Visit (INDEPENDENT_AMBULATORY_CARE_PROVIDER_SITE_OTHER): Payer: Medicaid Other | Admitting: *Deleted

## 2021-01-09 DIAGNOSIS — Z5181 Encounter for therapeutic drug level monitoring: Secondary | ICD-10-CM | POA: Diagnosis not present

## 2021-01-09 DIAGNOSIS — Z952 Presence of prosthetic heart valve: Secondary | ICD-10-CM | POA: Diagnosis not present

## 2021-01-09 LAB — POCT INR: INR: 2.5 (ref 2.0–3.0)

## 2021-01-09 NOTE — Patient Instructions (Signed)
Continue warfarin 15mg  daily Recheck on 01/16/21

## 2021-01-11 ENCOUNTER — Ambulatory Visit (INDEPENDENT_AMBULATORY_CARE_PROVIDER_SITE_OTHER): Payer: Medicaid Other

## 2021-01-11 ENCOUNTER — Encounter: Payer: Self-pay | Admitting: Internal Medicine

## 2021-01-11 ENCOUNTER — Ambulatory Visit (INDEPENDENT_AMBULATORY_CARE_PROVIDER_SITE_OTHER): Payer: Medicaid Other | Admitting: Internal Medicine

## 2021-01-11 ENCOUNTER — Other Ambulatory Visit: Payer: Self-pay

## 2021-01-11 DIAGNOSIS — R079 Chest pain, unspecified: Secondary | ICD-10-CM

## 2021-01-11 DIAGNOSIS — R0600 Dyspnea, unspecified: Secondary | ICD-10-CM

## 2021-01-11 DIAGNOSIS — R0609 Other forms of dyspnea: Secondary | ICD-10-CM

## 2021-01-11 LAB — CBC WITH DIFFERENTIAL/PLATELET
Basophils Absolute: 0.1 10*3/uL (ref 0.0–0.1)
Basophils Relative: 1.1 % (ref 0.0–3.0)
Eosinophils Absolute: 0.3 10*3/uL (ref 0.0–0.7)
Eosinophils Relative: 2.6 % (ref 0.0–5.0)
HCT: 42.6 % (ref 39.0–52.0)
Hemoglobin: 14.5 g/dL (ref 13.0–17.0)
Lymphocytes Relative: 23.1 % (ref 12.0–46.0)
Lymphs Abs: 2.3 10*3/uL (ref 0.7–4.0)
MCHC: 34.1 g/dL (ref 30.0–36.0)
MCV: 92.3 fl (ref 78.0–100.0)
Monocytes Absolute: 1.2 10*3/uL — ABNORMAL HIGH (ref 0.1–1.0)
Monocytes Relative: 11.6 % (ref 3.0–12.0)
Neutro Abs: 6.2 10*3/uL (ref 1.4–7.7)
Neutrophils Relative %: 61.6 % (ref 43.0–77.0)
Platelets: 283 10*3/uL (ref 150.0–400.0)
RBC: 4.62 Mil/uL (ref 4.22–5.81)
RDW: 13.3 % (ref 11.5–15.5)
WBC: 10 10*3/uL (ref 4.0–10.5)

## 2021-01-11 LAB — TSH: TSH: 1.1 u[IU]/mL (ref 0.35–5.50)

## 2021-01-11 LAB — SEDIMENTATION RATE: Sed Rate: 3 mm/hr (ref 0–15)

## 2021-01-11 NOTE — Assessment & Plan Note (Addendum)
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  - Active  smoker - 01/11/2021   Walked on RA x  3  lap(s) =  approx 750 @ moderate (would not go faster) pace, stopped due to sob/dizzy with lowest 02 sats 95%    Symptoms are markedly disproportionate to objective findings and not clear to what extent this is actually a pulmonary  problem but pt does appear to have difficult to sort out respiratory symptoms of unknown origin for which  DDX  = almost all start with A and  include Adherence, Ace Inhibitors, Acid Reflux, Active Sinus Disease, Alpha 1 Antitripsin deficiency, Anxiety masquerading as Airways dz,  ABPA,  Allergy(esp in young), Aspiration (esp in elderly), Adverse effects of meds,  Active smoking or Vaping, A bunch of PE's/clot burden (a few small clots can't cause this syndrome unless there is already severe underlying pulm or vascular dz with poor reserve),  Anemia or thyroid disorder, plus two Bs  = Bronchiectasis and Beta blocker use..and one C= CHF     Adherence is always the initial "prime suspect" and is a multilayered concern that requires a "trust but verify" approach in every patient - starting with knowing how to use medications, especially inhalers, correctly, keeping up with refills and understanding the fundamental difference between maintenance and prns vs those medications only taken for a very short course and then stopped and not refilled.   Anemia/ thryoid dz excluded today  ? A bunch of PE's > D dimer high nl - while a normal  or high normal value (seen commonly in the elderly or chronically ill)  may miss small peripheral pe, the clot burden with sob is moderately high and the d dimer  has a very high neg pred value if used in this setting esp in a pt already on coumadin for valvular ht dz   ? Anxiety/depression/ deconditioning  > usually at the bottom of this list of usual suspects but may interfere with adherence and also interpretation of response or lack thereof to symptom management which  can be quite subjective.   ? chf > bnp quite low  during symtoms   rec sub max ex 30 min daily and regroup in 2 weeks if not better

## 2021-01-11 NOTE — Patient Instructions (Addendum)
Classic  pain pattern suggests this is  coming from intestinal gas:   migratory with a very limited distribution of pain locations  worse in certain  positions, frequently associated with generalized abd bloating.  Treatment consists of avoiding foods that cause gas (especially boiled eggs, mexcican food but especially  beans and undercooked vegetables like spinach and some salads)  and citrucel 1 heaping tsp twice daily with a large glass of water.  Pain should improve w/in 2 weeks and if not then consider further GI work up.    The key is to stop smoking completely before smoking completely stops you!  We will walk you today to sort out why you are so short of breath.    Please remember to go to the lab and x-ray department  for your tests - we will call you with the results when they are available.  Please schedule a follow up office visit in 4 weeks, sooner if needed  with all medications /inhalers/ solutions in hand so we can verify exactly what you are taking. This includes all medications from all doctors and over the counters

## 2021-01-11 NOTE — Progress Notes (Signed)
Scott [name], male    DOB: [birth year],    MRN: [id number]   Brief patient profile:  45 yowm active smoker coartation/aortic valve repair age 45 in [place] works as Curator breathed   referred to pulmonary clinic 01/11/2021 by Dr   Scott [name] for sob and unexplained cp.  Onset of cp was around 2012 p inhalational injury by paint was used while working in [employer] pain would come and go both side but more on Left with periods of several weeks with good ex tolerance push mower , work out s aerobics then 2019 worse pain and worse breathing even when not having pain > AVR at [facility] 2020 never right since did not do any rehab then bad to worse around 1st September 2022 .    History of Present Illness  01/11/2021  Pulmonary/ 1st office eval/Scott [name]  Chief Complaint  Patient presents with   Pulmonary Consult    Referred by Dr Scott [name]. Pt c/o SOB off and on for the past 2 years, worse past 2-3 wks. He is having to left side pain and CP today and says he "feels rumbling in there".  He states his symptoms are worse with exertion, lying down or after he eats.   Dyspnea:  last mowed grass in 30 min 4 y prior to OV  now has to stop every very slow pace  Cough: nothing coming up but lots of drainage issuex 15 years developed in [place] and epistaxis x several years and hemoptysis x 3 weeks  Sleep: bed at 45 degrees with pillows  SABA use: none  02 none   No obvious patterns in day to day or daytime variability or assoc excess/ purulent sputum or mucus plugs or chest tightness, subjective wheeze or overt sinus or hb symptoms.    Also denies any obvious fluctuation of symptoms with weather or environmental changes or other aggravating or alleviating factors except as outlined above   No unusual exposure hx or h/o childhood pna/ asthma or knowledge of premature birth.  Current Allergies, Complete Past Medical History, Past Surgical History, Family History, and Social History were reviewed in [employer] record.  ROS  The following are not active complaints unless bolded Hoarseness, sore throat, dysphagia, dental problems, itching, sneezing,  nasal congestion or discharge of excess mucus or purulent secretions, ear ache,   fever, chills, sweats, unintended wt loss or wt gain, classically  exertional cp,  orthopnea pnd or arm/hand swelling  or leg swelling, presyncope, palpitations, abdominal pain, anorexia, nausea, vomiting, diarrhea  or change in bowel habits or change in bladder habits, change in stools or change in urine, dysuria, hematuria,  rash, arthralgias, visual complaints, headache, numbness, weakness or ataxia or problems with walking or coordination,  change in mood or  memory.           Past Medical History:  Diagnosis Date   Anxiety    Aortic stenosis    Murmur, cardiac    Pancreatitis, acute 01/28/2017    Outpatient Medications Prior to Visit  Medication Sig Dispense Refill   acetaminophen (TYLENOL) 325 MG tablet Take 650 mg by mouth every 6 (six) hours as needed.     cetirizine (ZYRTEC) 10 MG tablet Take 10 mg by mouth daily.     hydrochlorothiazide (HYDRODIURIL) 25 MG tablet Take 25 mg by mouth daily.     metoprolol succinate (TOPROL-XL) 25 MG 24 hr tablet Take 25 mg by mouth daily.  0   rosuvastatin (CRESTOR) 10 MG tablet Take 1 tablet (10 mg total) by mouth daily. 90 tablet 3   warfarin (COUMADIN) 10 MG tablet Take 1.5 tablets (15 mg total) by mouth daily. 45 tablet 0       0      Objective:     BP (!) 120/56 (BP Location: Right Arm, Cuff Size: Normal)   Pulse 84   Temp 98.1 F (36.7 C) (Oral)   Ht 5\' 11"  (1.803 m)   Wt 163 lb 12.8 oz (74.3 kg)   SpO2 98% Comment: on RA  BMI 22.85 kg/m   SpO2: 98 % (on RA)  Amb wm nad   HEENT : pt wearing mask not removed for exam due to covid -19 concerns.    NECK :  without JVD/Nodes/TM/ nl carotid upstrokes bilaterally   LUNGS: no acc muscle use,  Nl contour chest which is clear to A  and P bilaterally without cough on insp or exp maneuvers   CV:  RRR  no s3  3/6  increase in P2, and no edema   ABD:  soft and nontender with nl inspiratory excursion in the supine position. No bruits or organomegaly appreciated, bowel sounds nl  MS:  Nl gait/ ext warm without deformities, calf tenderness, cyanosis or clubbing No obvious joint restrictions   SKIN: warm and dry without lesions    NEURO:  alert, approp, nl sensorium with  no motor or cerebellar deficits apparent.    CXR PA and Lateral:   01/11/2021 :    I personally reviewed images and agree with radiology impression as follows:   Blunting of the LEFT costophrenic angle is chronic. Focus of consolidation in the superior aspect of the RIGHT lower lobe is unchanged over multiple comparison exams including CT 03/21/19   Labs ordered/ reviewed:      Chemistry      Component Value Date/Time   NA 136 12/30/2020 1536   NA 137 01/01/2013 1836   K 3.6 12/30/2020 1536   K 4.2 01/01/2013 1836   CL 104 12/30/2020 1536   CL 106 01/01/2013 1836   CO2 26 12/30/2020 1536   CO2 27 01/01/2013 1836   BUN 20 12/30/2020 1536   BUN 13 01/01/2013 1836   CREATININE 1.02 12/30/2020 1536   CREATININE 1.07 01/01/2013 1836      Component Value Date/Time   CALCIUM 9.0 12/30/2020 1536   CALCIUM 8.9 01/01/2013 1836   ALKPHOS 79 10/18/2020 1113   ALKPHOS 120 01/07/2012 1753   AST 24 10/18/2020 1113   AST 20 01/07/2012 1753   ALT 27 10/18/2020 1113   ALT 30 01/07/2012 1753   BILITOT 0.5 10/18/2020 1113   BILITOT 0.3 01/07/2012 1753        Lab Results  Component Value Date   WBC 10.0 01/11/2021   HGB 14.5 01/11/2021   HCT 42.6 01/11/2021   MCV 92.3 01/11/2021   PLT 283.0 01/11/2021     Lab Results  Component Value Date   DDIMER 0.69 (H) 01/11/2021      Lab Results  Component Value Date   TSH 1.10 01/11/2021     No results found for: PROBNP     Lab Results  Component Value Date   ESRSEDRATE 3 01/11/2021        BNP  12/30/20     = 27      Assessment   DOE (dyspnea on exertion) 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  - Active  smoker - 01/11/2021   Walked on RA x  3  lap(s) =  approx 750 @ moderate (would not go faster) pace, stopped due to sob/dizzy with lowest 02 sats 95%    Symptoms are markedly disproportionate to objective findings and not clear to what extent this is actually a pulmonary  problem but pt does appear to have difficult to sort out respiratory symptoms of unknown origin for which  DDX  = almost all start with A and  include Adherence, Ace Inhibitors, Acid Reflux, Active Sinus Disease, Alpha 1 Antitripsin deficiency, Anxiety masquerading as Airways dz,  ABPA,  Allergy(esp in young), Aspiration (esp in elderly), Adverse effects of meds,  Active smoking or Vaping, A bunch of PE's/clot burden (a few small clots can't cause this syndrome unless there is already severe underlying pulm or vascular dz with poor reserve),  Anemia or thyroid disorder, plus two Bs  = Bronchiectasis and Beta blocker use..and one C= CHF     Adherence is always the initial "prime suspect" and is a multilayered concern that requires a "trust but verify" approach in every patient - starting with knowing how to use medications, especially inhalers, correctly, keeping up with refills and understanding the fundamental difference between maintenance and prns vs those medications only taken for a very short course and then stopped and not refilled.   Anemia/ thryoid dz excluded today  ? A bunch of PE's > D dimer high nl - while a normal  or high normal value (seen commonly in the elderly or chronically ill)  may miss small peripheral pe, the clot burden with sob is moderately high and the d dimer  has a very high neg pred value if used in this setting esp in a pt already on coumadin for valvular ht dz   ? Anxiety/depression/ deconditioning  > usually at the bottom of this list of usual suspects but may interfere with adherence  and also interpretation of response or lack thereof to symptom management which can be quite subjective.   ? chf > bnp quite low  during symtoms   rec sub max ex 30 min daily and regroup in 2 weeks if not better     Chest pain of unknown etiology Onset around 2012 p paint inhalation worse since AVR around 2020 [facility] - Rec rx for  IBS 01/11/2021 >>>   Cxr does show intestinal air under L HD where most of his pain has been recurring but this is a non-specific finding   rec citrucel and diet and f/u with CT with contrast if not improving on this rx   Each maintenance medication was reviewed in detail including emphasizing most importantly the difference between maintenance and prns and under what circumstances the prns are to be triggered using an action plan format where appropriate.  Total time for H and P, chart review, counseling,  directly observing portions of ambulatory 02 saturation study/ and generating customized AVS unique to this office visit / same day charting  > 45 min            Sandrea Hughs, MD 01/13/2021

## 2021-01-11 NOTE — Assessment & Plan Note (Addendum)
Onset around 2012 p paint inhalation worse since AVR around 2020 [facility] - Rec rx for  IBS 01/11/2021 >>>   Cxr does show intestinal air under L HD where most of his pain has been recurring but this is a non-specific finding   rec citrucel and diet and f/u with CT with contrast if not improving on this rx   Each maintenance medication was reviewed in detail including emphasizing most importantly the difference between maintenance and prns and under what circumstances the prns are to be triggered using an action plan format where appropriate.  Total time for H and P, chart review, counseling,  directly observing portions of ambulatory 02 saturation study/ and generating customized AVS unique to this office visit / same day charting  > 45 min               k

## 2021-01-12 LAB — D-DIMER, QUANTITATIVE: D-Dimer, Quant: 0.69 mcg/mL FEU — ABNORMAL HIGH (ref <0.50)

## 2021-01-12 LAB — IGE: IgE (Immunoglobulin E), Serum: 83 kU/L (ref <=114)

## 2021-01-13 ENCOUNTER — Encounter: Payer: Self-pay | Admitting: Internal Medicine

## 2021-01-15 ENCOUNTER — Other Ambulatory Visit: Payer: Self-pay

## 2021-01-15 ENCOUNTER — Emergency Department (HOSPITAL_COMMUNITY)
Admission: EM | Admit: 2021-01-15 | Discharge: 2021-01-15 | Disposition: A | Discharge status: Home / Self Care | Payer: Medicaid Other | Attending: Emergency Medicine | Admitting: Emergency Medicine

## 2021-01-15 ENCOUNTER — Encounter (HOSPITAL_COMMUNITY): Payer: Self-pay | Admitting: Emergency Medicine

## 2021-01-15 DIAGNOSIS — K029 Dental caries, unspecified: Secondary | ICD-10-CM | POA: Insufficient documentation

## 2021-01-15 DIAGNOSIS — F17210 Nicotine dependence, cigarettes, uncomplicated: Secondary | ICD-10-CM | POA: Diagnosis not present

## 2021-01-15 DIAGNOSIS — G44009 Cluster headache syndrome, unspecified, not intractable: Secondary | ICD-10-CM | POA: Diagnosis not present

## 2021-01-15 DIAGNOSIS — G44019 Episodic cluster headache, not intractable: Secondary | ICD-10-CM | POA: Insufficient documentation

## 2021-01-15 DIAGNOSIS — K0889 Other specified disorders of teeth and supporting structures: Secondary | ICD-10-CM | POA: Diagnosis present

## 2021-01-15 DIAGNOSIS — Z7901 Long term (current) use of anticoagulants: Secondary | ICD-10-CM | POA: Insufficient documentation

## 2021-01-15 MED ORDER — AMOXICILLIN 250 MG PO CAPS
500.0000 mg | ORAL_CAPSULE | Freq: Once | ORAL | Status: AC
  Administered 2021-01-15: 500 mg via ORAL
  Filled 2021-01-15: qty 2

## 2021-01-15 MED ORDER — KETOROLAC TROMETHAMINE 60 MG/2ML IM SOLN
30.0000 mg | Freq: Once | INTRAMUSCULAR | Status: AC
  Administered 2021-01-15: 30 mg via INTRAMUSCULAR
  Filled 2021-01-15: qty 2

## 2021-01-15 MED ORDER — METOCLOPRAMIDE HCL 5 MG/ML IJ SOLN
10.0000 mg | Freq: Once | INTRAMUSCULAR | Status: AC
  Administered 2021-01-15: 10 mg via INTRAMUSCULAR
  Filled 2021-01-15: qty 2

## 2021-01-15 MED ORDER — AMOXICILLIN 500 MG PO CAPS: 500.0000 mg | ORAL_CAPSULE | Freq: Three times a day (TID) | ORAL | 0 refills | Status: DC

## 2021-01-15 NOTE — ED Triage Notes (Signed)
Pt reports right sided dental and facial pain. Pt stating he has dentist appt on Thursday but "could not take the pain." Denies fevers.

## 2021-01-15 NOTE — ED Notes (Signed)
Pt sleeping. 

## 2021-01-15 NOTE — ED Notes (Signed)
Pt ambulatory to waiting room. Pt verbalized understanding of discharge instructions.   

## 2021-01-15 NOTE — ED Provider Notes (Signed)
[facility] EMERGENCY DEPARTMENT Provider Note  CSN: [id number] Arrival date & time: 01/15/21 0730    History Chief Complaint  Patient presents with   Dental Pain    Scott [name] is a 45 y.o. male presents for evaluation of R facial pain. Reports two days of sharp stabbing pain in R face, into his ear and upper teeth. Associated with rhinorrhea. He denies any blurry vision or fever. He has a remote history of aortic valve repair on coumadin, recent INR was 2.5. He avoids NSAIDs on a regular basis but has had Toradol without issue. He has had similar pain in the past diagnosed with sinus headache. He has made an appointment with a dentist for evaluation later this week.    Past Medical History:  Diagnosis Date   Anxiety    Aortic stenosis    Murmur, cardiac    Pancreatitis, acute 01/28/2017    Past Surgical History:  Procedure Laterality Date   AORTIC VALVE REPAIR     when pt was 45 years old   CARDIAC SURGERY     FINGER SURGERY     MECHANICAL AORTIC VALVE REPLACEMENT     pt has card with mechanical valve replacemnt details   RIGHT/LEFT HEART CATH AND CORONARY ANGIOGRAPHY Bilateral 09/17/2018   Procedure: RIGHT/LEFT HEART CATH AND CORONARY ANGIOGRAPHY;  Surgeon: Marcina Millard, MD;  Location: [facility] INVASIVE CV LAB;  Service: Cardiovascular;  Laterality: Bilateral;    No family history on file.  Social History   Tobacco Use   Smoking status: Every Day    Packs/day: 1.50    Years: 30.00    Pack years: 45.00    Types: Cigarettes   Smokeless tobacco: Never  Vaping Use   Vaping Use: Never used  Substance Use Topics   Alcohol use: Not Currently    Comment: occ   Drug use: Yes    Types: Marijuana    Comment: daily use     Home Medications Prior to Admission medications   Medication Sig Start Date End Date Taking? Authorizing Provider  amoxicillin (AMOXIL) 500 MG capsule Take 1 capsule (500 mg total) by mouth 3 (three) times daily. 01/15/21  Yes Pollyann Savoy, MD  acetaminophen (TYLENOL) 325 MG tablet Take 650 mg by mouth every 6 (six) hours as needed.    [provider]  cetirizine (ZYRTEC) 10 MG tablet Take 10 mg by mouth daily.    [provider]  hydrochlorothiazide (HYDRODIURIL) 25 MG tablet Take 25 mg by mouth daily.    [provider]  metoprolol succinate (TOPROL-XL) 25 MG 24 hr tablet Take 25 mg by mouth daily. 10/29/20   [provider]  mometasone (NASONEX) 50 MCG/ACT nasal spray Place 2 sprays into the nose daily. Patient taking differently: Place 2 sprays into the nose daily. As needed 10/15/20   Triplett, Tammy, PA-C  rosuvastatin (CRESTOR) 10 MG tablet Take 1 tablet (10 mg total) by mouth daily. 01/04/21 04/04/21  [name], MD  warfarin (COUMADIN) 10 MG tablet Take 1.5 tablets (15 mg total) by mouth daily. 12/30/20   [name], MD     Allergies    Cocoa   Review of Systems   Review of Systems A comprehensive review of systems was completed and negative except as noted in HPI.    Physical Exam BP 131/65 (BP Location: Right Arm)   Pulse 63   Temp 98.6 F (37 C) (Oral)   Resp 15   SpO2 98%  Physical Exam Vitals and nursing note reviewed.  Constitutional:      Appearance: Normal appearance.  HENT:     Head: Normocephalic and atraumatic.     Nose: Nose normal.     Mouth/Throat:     Mouth: Mucous membranes are moist.     Comments: Multiple dental caries, but no gingival swelling or tenderness to palpation Eyes:     Extraocular Movements: Extraocular movements intact.     Conjunctiva/sclera: Conjunctivae normal.  Cardiovascular:     Rate and Rhythm: Normal rate.  Pulmonary:     Effort: Pulmonary effort is normal.     Breath sounds: Normal breath sounds.  Abdominal:     General: Abdomen is flat.     Palpations: Abdomen is soft.     Tenderness: There is no abdominal tenderness.  Musculoskeletal:        General: No swelling. Normal range of motion.     Cervical  back: Neck supple.  Skin:    General: Skin is warm and dry.  Neurological:     General: No focal deficit present.     Mental Status: He is alert.     Cranial Nerves: No cranial nerve deficit.     Motor: No weakness.     Gait: Gait normal.  Psychiatric:        Mood and Affect: Mood normal.     ED Results / Procedures / Treatments   Labs (all labs ordered are listed, but only abnormal results are displayed) Labs Reviewed - No data to display  EKG None  Radiology No results found.  Procedures Procedures  Medications Ordered in the ED Medications  ketorolac (TORADOL) injection 30 mg (30 mg Intramuscular Given 01/15/21 0823)  metoCLOPramide (REGLAN) injection 10 mg (10 mg Intramuscular Given 01/15/21 0822)  amoxicillin (AMOXIL) capsule 500 mg (500 mg Oral Given 01/15/21 4854)     MDM Rules/Calculators/A&P MDM Patient with R facial pain, likely cluster headaches. Has had similar in the past (seen in ED x 3 in Jun/Jul for same), sharp R facial pain with rhinorrhea. Will give one time dose of Toradol, Reglan, and start on NRB oxygen. He has poor dentition and scheduled to see dentist later this week but I'm not convinced his pain is dental in origin. Regardless because of his prior heart valve repair, will begin Amoxil as well.   ED Course  I have reviewed the triage vital signs and the nursing notes.  Pertinent labs & imaging results that were available during my care of the patient were reviewed by me and considered in my medical decision making (see chart for details).  Clinical Course as of 01/15/21 0945  Wynelle Link Jan 15, 2021  [id number] Patient feeling much better. Rx for Amoxil for future dental appointment, referral to Neurology for suspected cluster headaches. RTED for any other concerns.  [CS]    Clinical Course User Index [CS] Pollyann Savoy, MD    Final Clinical Impression(s) / ED Diagnoses Final diagnoses:  Episodic cluster headache, not intractable  Dental caries     Rx / DC Orders ED Discharge Orders          Ordered    amoxicillin (AMOXIL) 500 MG capsule  3 times daily        01/15/21 0945             Pollyann Savoy, MD 01/15/21 [phone]

## 2021-01-16 ENCOUNTER — Telehealth: Payer: Self-pay

## 2021-01-16 NOTE — Telephone Encounter (Signed)
Transition Care Management Unsuccessful Follow-up Telephone Call  Date of discharge and from where:  01/14/2021-[place]  Attempts:  1st Attempt  Reason for unsuccessful TCM follow-up call:  Left voice message

## 2021-01-17 NOTE — Telephone Encounter (Signed)
Transition Care Management Unsuccessful Follow-up Telephone Call  Date of discharge and from where:  01/14/2021 from [facility]  Attempts:  2nd Attempt  Reason for unsuccessful TCM follow-up call:  Left voice message

## 2021-01-18 NOTE — Telephone Encounter (Signed)
Transition Care Management Unsuccessful Follow-up Telephone Call  Date of discharge and from where:  01/14/2021-[place]   Attempts:  3rd Attempt  Reason for unsuccessful TCM follow-up call:  Left voice message

## 2021-01-24 ENCOUNTER — Ambulatory Visit (HOSPITAL_COMMUNITY): Admission: RE | Admit: 2021-01-24 | Payer: Medicaid Other | Source: Ambulatory Visit

## 2021-01-26 ENCOUNTER — Ambulatory Visit: Payer: Medicaid Other | Admitting: Adult Health

## 2021-02-10 ENCOUNTER — Ambulatory Visit: Payer: Medicaid Other | Admitting: Internal Medicine

## 2021-02-10 NOTE — Progress Notes (Incomplete)
Scott [name], male    DOB: 18-May-1975,    MRN: [id number]   Brief patient profile:  19 yowm active smoker coartation/aortic valve repair age 45 in [name] as [name] breathed   referred to pulmonary clinic 01/11/2021 by Dr   Scott [name] for sob and unexplained cp.  Onset of cp was around 2012 p inhalational injury by paint was used while working in [name] pain would come and go both side but more on Left with periods of several weeks with good ex tolerance push mower , work out s aerobics then 2019 worse pain and worse breathing even when not having pain > AVR at [name] 2020 never right since did not do any rehab then bad to worse around 1st September 2022 .    History of Present Illness  01/11/2021  Pulmonary/ 1st office eval/[name]  Chief Complaint  Patient presents with   Pulmonary Consult    Referred by Dr Scott [name]. Pt c/o SOB off and on for the past 2 years, worse past 2-3 wks. He is having to left side pain and CP today and says he "feels rumbling in there".  He states his symptoms are worse with exertion, lying down or after he eats.   Dyspnea:  last mowed grass in 30 min 4 y prior to OV  now has to stop every very slow pace  Cough: nothing coming up but lots of drainage issuex 15 years developed in [name] and epistaxis x several years and hemoptysis x 3 weeks  Sleep: bed at 45 degrees with pillows  SABA use: none  02 none  Imp IBS Rec Diet plus citrucel 1 heaping tsp twice daily with a large glass of water.  Pain should improve w/in 2 weeks and if not then consider further GI work up.   key is to stop smoking completely before smoking completely stops you!  office visit in 4 weeks, sooner if needed  with all medications /inhalers/ solutions in hand so we can verify exactly what you are taking. This includes all medications from all doctors and over the counters     02/10/2021  f/u ov/[name] re: ***   maint on ***  No chief complaint on file.   Dyspnea:  *** Cough: *** Sleeping:  *** SABA use: *** 02: *** Covid status:   ***   No obvious day to day or daytime variability or assoc excess/ purulent sputum or mucus plugs or hemoptysis or cp or chest tightness, subjective wheeze or overt sinus or hb symptoms.   *** without nocturnal  or early am exacerbation  of respiratory  c/o's or need for noct saba. Also denies any obvious fluctuation of symptoms with weather or environmental changes or other aggravating or alleviating factors except as outlined above   No unusual exposure hx or h/o childhood pna/ asthma or knowledge of premature birth.  Current Allergies, Complete Past Medical History, Past Surgical History, Family History, and Social History were reviewed in [name] record.  ROS  The following are not active complaints unless bolded Hoarseness, sore throat, dysphagia, dental problems, itching, sneezing,  nasal congestion or discharge of excess mucus or purulent secretions, ear ache,   fever, chills, sweats, unintended wt loss or wt gain, classically pleuritic or exertional cp,  orthopnea pnd or arm/hand swelling  or leg swelling, presyncope, palpitations, abdominal pain, anorexia, nausea, vomiting, diarrhea  or change in bowel habits or change in bladder habits, change in stools or change in urine, dysuria,  hematuria,  rash, arthralgias, visual complaints, headache, numbness, weakness or ataxia or problems with walking or coordination,  change in mood or  memory.        No outpatient medications have been marked as taking for the 02/10/21 encounter (Appointment) with Scott Cowden, MD.             Past Medical History:  Diagnosis Date   Anxiety    Aortic stenosis    Murmur, cardiac    Pancreatitis, acute 01/28/2017      Objective:      Wt Readings from Last 3 Encounters:  01/11/21 163 lb 12.8 oz (74.3 kg)  12/30/20 162 lb 12.8 oz (73.8 kg)  10/18/20 168 lb (76.2 kg)      Vital signs reviewed  02/10/2021  - Note at rest  02 sats  ***% on ***   General appearance:    ***       Assessment

## 2021-03-08 ENCOUNTER — Ambulatory Visit (INDEPENDENT_AMBULATORY_CARE_PROVIDER_SITE_OTHER): Payer: Medicaid Other | Admitting: *Deleted

## 2021-03-08 DIAGNOSIS — Z5181 Encounter for therapeutic drug level monitoring: Secondary | ICD-10-CM

## 2021-03-08 DIAGNOSIS — Z952 Presence of prosthetic heart valve: Secondary | ICD-10-CM

## 2021-03-08 LAB — POCT INR: INR: 1.3 — AB (ref 2.0–3.0)

## 2021-03-08 MED ORDER — WARFARIN SODIUM 10 MG PO TABS: ORAL_TABLET | ORAL | 0 refills | Status: DC

## 2021-03-08 NOTE — Patient Instructions (Addendum)
Description   Take 2 tablets (20mg ) of warfarin today and tomorrow, then Continue warfarin 15mg  daily. Recheck INR in 1 week.

## 2021-03-15 ENCOUNTER — Ambulatory Visit (INDEPENDENT_AMBULATORY_CARE_PROVIDER_SITE_OTHER): Payer: Medicaid Other | Admitting: *Deleted

## 2021-03-15 DIAGNOSIS — Z952 Presence of prosthetic heart valve: Secondary | ICD-10-CM

## 2021-03-15 DIAGNOSIS — Z5181 Encounter for therapeutic drug level monitoring: Secondary | ICD-10-CM

## 2021-03-15 DIAGNOSIS — Z0001 Encounter for general adult medical examination with abnormal findings: Secondary | ICD-10-CM

## 2021-03-15 LAB — POCT INR: INR: 3.9 — AB (ref 2.0–3.0)

## 2021-03-15 NOTE — Patient Instructions (Signed)
Hold warfarin tonight then resume 15mg  daily. Recheck INR in 2 weeks.

## 2021-03-27 ENCOUNTER — Encounter (HOSPITAL_COMMUNITY): Payer: Self-pay

## 2021-03-27 ENCOUNTER — Other Ambulatory Visit: Payer: Self-pay

## 2021-03-27 ENCOUNTER — Emergency Department (HOSPITAL_COMMUNITY)
Admission: EM | Admit: 2021-03-27 | Discharge: 2021-03-27 | Disposition: A | Discharge status: Home / Self Care | Payer: Medicaid Other | Attending: Emergency Medicine | Admitting: Emergency Medicine

## 2021-03-27 DIAGNOSIS — Z7901 Long term (current) use of anticoagulants: Secondary | ICD-10-CM | POA: Diagnosis not present

## 2021-03-27 DIAGNOSIS — M5442 Lumbago with sciatica, left side: Secondary | ICD-10-CM | POA: Diagnosis not present

## 2021-03-27 DIAGNOSIS — M5441 Lumbago with sciatica, right side: Secondary | ICD-10-CM | POA: Diagnosis not present

## 2021-03-27 DIAGNOSIS — M545 Low back pain, unspecified: Secondary | ICD-10-CM | POA: Diagnosis present

## 2021-03-27 DIAGNOSIS — F17210 Nicotine dependence, cigarettes, uncomplicated: Secondary | ICD-10-CM | POA: Insufficient documentation

## 2021-03-27 MED ORDER — CYCLOBENZAPRINE HCL 10 MG PO TABS
10.0000 mg | ORAL_TABLET | Freq: Once | ORAL | Status: AC
  Administered 2021-03-27: 10 mg via ORAL
  Filled 2021-03-27: qty 1

## 2021-03-27 MED ORDER — ACETAMINOPHEN 325 MG PO TABS
650.0000 mg | ORAL_TABLET | Freq: Once | ORAL | Status: AC
  Administered 2021-03-27: 650 mg via ORAL
  Filled 2021-03-27: qty 2

## 2021-03-27 MED ORDER — CYCLOBENZAPRINE HCL 10 MG PO TABS: 10.0000 mg | ORAL_TABLET | Freq: Three times a day (TID) | ORAL | 0 refills | Status: DC | PRN

## 2021-03-27 NOTE — ED Triage Notes (Signed)
Pt arrived via POV w c/o back pain after bending over yesterday afternoon that has not stopped.

## 2021-03-27 NOTE — Discharge Instructions (Addendum)
Apply ice for 30 minutes at a time, 4 times a day. ° °Take acetaminophen as needed for pain. ° °Return if you are having any problems. °

## 2021-03-27 NOTE — ED Provider Notes (Signed)
[facility] EMERGENCY DEPARTMENT Provider Note   CSN: [id number] Arrival date & time: 03/27/21  0258     History Chief Complaint  Patient presents with   Back Pain    Scott [name] is a 45 y.o. male.  The history is provided by the patient.  Back Pain He has history of aortic valve replacement with chronic anticoagulation with warfarin and comes in because of low back pain.  Yesterday, he was using a net to try to pick up some and has not had poles when he developed severe pain in his lower back which radiated down both legs.  He rates pain at 7/10.  Its worse if he tries to change position, especially trying to get up from bed.  He denies any weakness or numbness.  Denies any bowel or bladder dysfunction.  He has not had back pain before.  He did try applying heat without any benefit.   Past Medical History:  Diagnosis Date   Anxiety    Aortic stenosis    Murmur, cardiac    Pancreatitis, acute 01/28/2017    Patient Active Problem List   Diagnosis Date Noted   DOE (dyspnea on exertion) 01/11/2021   Chest pain of unknown etiology 01/11/2021   Encounter for general adult medical examination with abnormal findings 10/04/2020   Screening due 10/04/2020   S/P aortic valve replacement 10/04/2020   Acute sinusitis 10/04/2020   Marijuana user 01/28/2017    Past Surgical History:  Procedure Laterality Date   AORTIC VALVE REPAIR     when pt was 45 years old   CARDIAC SURGERY     FINGER SURGERY     MECHANICAL AORTIC VALVE REPLACEMENT     pt has card with mechanical valve replacemnt details   RIGHT/LEFT HEART CATH AND CORONARY ANGIOGRAPHY Bilateral 09/17/2018   Procedure: RIGHT/LEFT HEART CATH AND CORONARY ANGIOGRAPHY;  Surgeon: Marcina Millard, MD;  Location: [facility] INVASIVE CV LAB;  Service: Cardiovascular;  Laterality: Bilateral;       History reviewed. No pertinent family history.  Social History   Tobacco Use   Smoking status: Every Day    Packs/day: 1.50     Years: 30.00    Pack years: 45.00    Types: Cigarettes   Smokeless tobacco: Never  Vaping Use   Vaping Use: Never used  Substance Use Topics   Alcohol use: Not Currently    Comment: occ   Drug use: Yes    Types: Marijuana    Comment: daily use    Home Medications Prior to Admission medications   Medication Sig Start Date End Date Taking? Authorizing Provider  acetaminophen (TYLENOL) 325 MG tablet Take 650 mg by mouth every 6 (six) hours as needed.    [provider]  amoxicillin (AMOXIL) 500 MG capsule Take 1 capsule (500 mg total) by mouth 3 (three) times daily. 01/15/21   Pollyann Savoy, MD  cetirizine (ZYRTEC) 10 MG tablet Take 10 mg by mouth daily.    [provider]  hydrochlorothiazide (HYDRODIURIL) 25 MG tablet Take 25 mg by mouth daily.    [provider]  metoprolol succinate (TOPROL-XL) 25 MG 24 hr tablet Take 25 mg by mouth daily. 10/29/20   [provider]  mometasone (NASONEX) 50 MCG/ACT nasal spray Place 2 sprays into the nose daily. Patient taking differently: Place 2 sprays into the nose daily. As needed 10/15/20   Triplett, Tammy, PA-C  rosuvastatin (CRESTOR) 10 MG tablet Take 1 tablet (10 mg total) by mouth  daily. 01/04/21 04/04/21  [name], MD  warfarin (COUMADIN) 10 MG tablet Take 1 1/2 tablets by mouth daily or as directed by the coumadin clinic 03/08/21   [name], MD    Allergies    Cocoa  Review of Systems   Review of Systems  Musculoskeletal:  Positive for back pain.  All other systems reviewed and are negative.  Physical Exam Updated Vital Signs BP 97/63 (BP Location: Left Arm)   Pulse (!) 57   Temp 97.8 F (36.6 C) (Oral)   Resp 16   Ht 5\' 11"  (1.803 m)   Wt 77.1 kg   SpO2 97%   BMI 23.71 kg/m   Physical Exam Vitals and nursing note reviewed.  45 year old male, resting comfortably and in no acute distress. Vital signs are normal. Oxygen saturation is 97%, which is normal. Head is normocephalic  and atraumatic. PERRLA, EOMI. Oropharynx is clear. Neck is nontender and supple without adenopathy or JVD. Back has no midline tenderness.  There is moderate bilateral paralumbar spasm.  Straight leg raise is positive on the right at 5 degrees, on the left at 15 degrees.  There is no CVA tenderness. Lungs are clear without rales, wheezes, or rhonchi. Chest is nontender. Heart has regular rate and rhythm without murmur. Abdomen is soft, flat, nontender without masses or hepatosplenomegaly and peristalsis is normoactive. Extremities have no cyanosis or edema, full range of motion is present. Skin is warm and dry without rash. Neurologic: Mental status is normal, cranial nerves are intact, he has normal strength of dorsiflexion and plantarflexion bilaterally, no sensory deficits identified.  ED Results / Procedures / Treatments    Procedures Procedures   Medications Ordered in ED Medications  cyclobenzaprine (FLEXERIL) tablet 10 mg (has no administration in time range)  acetaminophen (TYLENOL) tablet 650 mg (has no administration in time range)    ED Course  I have reviewed the triage vital signs and the nursing notes.  MDM Rules/Calculators/A&P                         Acute low back pain with bilateral sciatica.  No evidence of any neurologic injury.  No red flags to suggest more serious pathology.  Old records are reviewed, confirming presence of prosthetic heart valve and anticoagulation with warfarin.  Because of this, he cannot be given NSAIDs.  He is advised to apply ice, take over-the-counter acetaminophen as needed for pain and is given prescription for cyclobenzaprine.  Follow-up with PCP.  Given back injury instructions and back exercises.  Final Clinical Impression(s) / ED Diagnoses Final diagnoses:  None    Rx / DC Orders ED Discharge Orders     None        60, MD 03/27/21 [phone]

## 2021-03-28 ENCOUNTER — Telehealth: Payer: Self-pay

## 2021-03-28 ENCOUNTER — Encounter: Payer: Self-pay | Admitting: Nurse Practitioner

## 2021-03-28 ENCOUNTER — Ambulatory Visit (INDEPENDENT_AMBULATORY_CARE_PROVIDER_SITE_OTHER): Payer: Medicaid Other | Admitting: Nurse Practitioner

## 2021-03-28 VITALS — BP 139/77 | HR 67 | Ht 71.0 in | Wt 166.0 lb

## 2021-03-28 DIAGNOSIS — G44029 Chronic cluster headache, not intractable: Secondary | ICD-10-CM | POA: Diagnosis not present

## 2021-03-28 DIAGNOSIS — M5442 Lumbago with sciatica, left side: Secondary | ICD-10-CM

## 2021-03-28 DIAGNOSIS — M545 Low back pain, unspecified: Secondary | ICD-10-CM | POA: Insufficient documentation

## 2021-03-28 DIAGNOSIS — Z7689 Persons encountering health services in other specified circumstances: Secondary | ICD-10-CM | POA: Diagnosis not present

## 2021-03-28 DIAGNOSIS — G44009 Cluster headache syndrome, unspecified, not intractable: Secondary | ICD-10-CM | POA: Insufficient documentation

## 2021-03-28 MED ORDER — DIPHENHYDRAMINE HCL 50 MG/ML IJ SOLN: 25.0000 mg | Freq: Once | INTRAMUSCULAR | Status: DC

## 2021-03-28 MED ORDER — PROMETHAZINE HCL 25 MG/ML IJ SOLN
25.0000 mg | Freq: Once | INTRAMUSCULAR | Status: AC
  Administered 2021-03-28: 25 mg via INTRAMUSCULAR

## 2021-03-28 MED ORDER — KETOROLAC TROMETHAMINE 60 MG/2ML IM SOLN
30.0000 mg | Freq: Once | INTRAMUSCULAR | Status: AC
  Administered 2021-03-28: 30 mg via INTRAMUSCULAR

## 2021-03-28 MED ORDER — DIPHENHYDRAMINE HCL 50 MG/ML IJ SOLN
25.0000 mg | Freq: Once | INTRAMUSCULAR | Status: AC
  Administered 2021-03-28: 25 mg via INTRAMUSCULAR

## 2021-03-28 NOTE — Assessment & Plan Note (Addendum)
Seen in the ED on 03/27/21 Continue flexeril 10mg  TID PRN. Tylenol 650mg  as needed. Will refer to otho is pain does not get better.

## 2021-03-28 NOTE — Assessment & Plan Note (Signed)
Recently seen in the ED for acute low back pain. Taking flexeril and tylenol as needed. Refer to otho if pain does not get better.

## 2021-03-28 NOTE — Telephone Encounter (Signed)
Transition Care Management Follow-up Telephone Call Date of discharge and from where: 03/28/2021 from [facility] How have you been since you were released from the hospital? Pt stated that he is still in pain. Pt stated that the pain is worsening in his back and legs with difficulty standing and walking. Pt stated that he was going to go back to the hospital. I did inform patient that he could reach out to PCP office to see if they have any same day appts to see him. Pt agreed to call office.  Any questions or concerns? No  Items Reviewed: Did the pt receive and understand the discharge instructions provided? Yes  Medications obtained and verified? Yes  Other? No  Any new allergies since your discharge? No  Dietary orders reviewed? No Do you have support at home? Yes   Functional Questionnaire: (I = Independent and D = Dependent) ADLs: I Bathing/Dressing- I Meal Prep- I Eating- I Maintaining continence- I Transferring/Ambulation- I Managing Meds- I   Follow up appointments reviewed: PCP Hospital f/u appt confirmed? No  Pt stated that he will call PCP office.  Specialist Hospital f/u appt confirmed? No   Are transportation arrangements needed? No  If their condition worsens, is the pt aware to call PCP or go to the Emergency Dept.? Yes Was the patient provided with contact information for the PCP's office or ED? Yes Was to pt encouraged to call back with questions or concerns? Yes

## 2021-03-28 NOTE — Progress Notes (Signed)
° °  Scott [name]     MRN: [id number]      DOB: Nov 26, 1975   HPI Scott [name] is here for follow up for back pain. He was seen in the ER on 03/27/2021. Pt c/o of back pain that started on 4 days ago, pain is shooting down his left leg. He heard something snapped in his back when playing with his children. His pain is 7/10, he has been using ice, tylenol and flexeril. Medications helps a little bit. He has never had back problem before.Denies tingling, numbness, denies bladder or bowel dysfunction.  Pt c/o chronic right sided HA, that started  abut 4 months ago.pain is intermittent,he has been having the HA about 4 times a month, pain last all day long when he has it. He can tell when the headache is coming, his teeth starts feeling warm and sensitive before the HA start, takes tylenol. He is on coumadin.   ROS Denies recent fever or chills. Denies sinus pressure, nasal congestion, ear pain or sore throat. Denies chest congestion, productive cough or wheezing. Denies chest pains, palpitations and leg swelling Denies abdominal pain, nausea, vomiting,diarrhea or constipation.   Denies dysuria, frequency, hesitancy or incontinence. Has low back joint pain radiating to left leg and limitation in mobility. Has  headaches, no seizures, numbness, or tingling. Denies depression, anxiety or insomnia. Denies skin break down or rash.   PE  BP 139/77 (BP Location: Right Arm, Patient Position: Sitting, Cuff Size: Normal)    Pulse 67    Ht 5\' 11"  (1.803 m)    Wt 166 lb (75.3 kg)    SpO2 98%    BMI 23.15 kg/m   Patient alert and oriented and in no cardiopulmonary distress.  HEENT: No facial asymmetry, EOMI,     Neck supple .  Chest: Clear to auscultation bilaterally.  CVS: S1, S2 no murmurs, no S3.Regular rate.  ABD: Soft non tender.   Ext: No edema  MS:  Low back tender to touch, patient appears to be in pain when walking.   Skin: Intact, no ulcerations or rash noted.  Psych: Good eye  contact, normal affect. Memory intact not anxious or depressed appearing.  CNS: CN 2-12 intact, power,  normal throughout.no focal deficits noted.   Assessment & Plan

## 2021-03-28 NOTE — Patient Instructions (Addendum)
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   You were given benadryl 25 mg, toradol 30mg  and phenergan 25mg  today for your cluster Headache.   It is important that you exercise regularly at least 30 minutes 5 times a week.  Think about what you will eat, plan ahead. Choose " clean, green, fresh or frozen" over canned, processed or packaged foods which are more sugary, salty and fatty. 70 to 75% of food eaten should be vegetables and fruit. Three meals at set times with snacks allowed between meals, but they must be fruit or vegetables. Aim to eat over a 12 hour period , example 7 am to 7 pm, and STOP after  your last meal of the day. Drink water,generally about 64 ounces per day, no other drink is as healthy. Fruit juice is best enjoyed in a healthy way, by EATING the fruit.  Thanks for choosing [facility], we consider it a privelige to serve you.

## 2021-03-28 NOTE — Assessment & Plan Note (Signed)
phenergan 25 mg, Toradol 30 mg benadryl 25 mg.  referral made to neurology.  Take tylenol as needed at home.

## 2021-04-20 ENCOUNTER — Ambulatory Visit: Payer: Medicaid Other | Admitting: Nurse Practitioner

## 2021-04-23 DIAGNOSIS — H5213 Myopia, bilateral: Secondary | ICD-10-CM | POA: Diagnosis not present

## 2021-04-28 ENCOUNTER — Other Ambulatory Visit: Payer: Self-pay | Admitting: Internal Medicine

## 2021-04-28 MED ORDER — WARFARIN SODIUM 10 MG PO TABS: ORAL_TABLET | ORAL | 0 refills | Status: DC

## 2021-04-28 NOTE — Telephone Encounter (Signed)
Prescription refill request received for warfarin Lov: 12/30/20  Next INR check: 03/29/21 Warfarin tablet strength: 10mg   Overdue for anticoag appt. Called and pt stated he was out of Warfarin. Appt scheduled for Monday 05/01/21 and Warfarin refill sent to requested pharmacy to last until appt on Monday. Pt verbalized understanding.

## 2021-05-01 ENCOUNTER — Ambulatory Visit (INDEPENDENT_AMBULATORY_CARE_PROVIDER_SITE_OTHER): Payer: Medicaid Other | Admitting: *Deleted

## 2021-05-01 ENCOUNTER — Other Ambulatory Visit: Payer: Self-pay

## 2021-05-01 DIAGNOSIS — Z5181 Encounter for therapeutic drug level monitoring: Secondary | ICD-10-CM

## 2021-05-01 DIAGNOSIS — Z952 Presence of prosthetic heart valve: Secondary | ICD-10-CM | POA: Diagnosis not present

## 2021-05-01 LAB — POCT INR: INR: 1.8 — AB (ref 2.0–3.0)

## 2021-05-01 MED ORDER — WARFARIN SODIUM 10 MG PO TABS: ORAL_TABLET | ORAL | 2 refills | Status: DC

## 2021-05-01 NOTE — Patient Instructions (Signed)
Continue warfarin 15mg  daily. Recheck INR in 3 weeks.

## 2021-05-04 ENCOUNTER — Ambulatory Visit (INDEPENDENT_AMBULATORY_CARE_PROVIDER_SITE_OTHER): Payer: Medicaid Other | Admitting: Nurse Practitioner

## 2021-05-04 ENCOUNTER — Other Ambulatory Visit: Payer: Self-pay

## 2021-05-04 ENCOUNTER — Emergency Department (HOSPITAL_COMMUNITY)
Admission: EM | Admit: 2021-05-04 | Discharge: 2021-05-04 | Disposition: A | Discharge status: Home / Self Care | Payer: Medicaid Other | Attending: Emergency Medicine | Admitting: Emergency Medicine

## 2021-05-04 ENCOUNTER — Encounter (HOSPITAL_COMMUNITY): Payer: Self-pay

## 2021-05-04 ENCOUNTER — Encounter: Payer: Self-pay | Admitting: Nurse Practitioner

## 2021-05-04 DIAGNOSIS — G44019 Episodic cluster headache, not intractable: Secondary | ICD-10-CM | POA: Diagnosis not present

## 2021-05-04 DIAGNOSIS — G44029 Chronic cluster headache, not intractable: Secondary | ICD-10-CM

## 2021-05-04 DIAGNOSIS — Z7901 Long term (current) use of anticoagulants: Secondary | ICD-10-CM | POA: Diagnosis not present

## 2021-05-04 DIAGNOSIS — R519 Headache, unspecified: Secondary | ICD-10-CM | POA: Diagnosis present

## 2021-05-04 DIAGNOSIS — G44009 Cluster headache syndrome, unspecified, not intractable: Secondary | ICD-10-CM | POA: Diagnosis not present

## 2021-05-04 MED ORDER — SODIUM CHLORIDE 0.9 % IV BOLUS
1000.0000 mL | Freq: Once | INTRAVENOUS | Status: AC
  Administered 2021-05-04: 1000 mL via INTRAVENOUS

## 2021-05-04 MED ORDER — VERAPAMIL HCL 80 MG PO TABS: 80.0000 mg | ORAL_TABLET | Freq: Three times a day (TID) | ORAL | 1 refills | Status: DC

## 2021-05-04 MED ORDER — KETOROLAC TROMETHAMINE 30 MG/ML IJ SOLN
30.0000 mg | Freq: Once | INTRAMUSCULAR | Status: AC
  Administered 2021-05-04: 30 mg via INTRAVENOUS
  Filled 2021-05-04: qty 1

## 2021-05-04 MED ORDER — MAGNESIUM SULFATE 2 GM/50ML IV SOLN
2.0000 g | INTRAVENOUS | Status: AC
  Administered 2021-05-04: 2 g via INTRAVENOUS
  Filled 2021-05-04: qty 50

## 2021-05-04 MED ORDER — SUMATRIPTAN SUCCINATE 50 MG PO TABS: 50.0000 mg | ORAL_TABLET | ORAL | 0 refills | Status: DC | PRN

## 2021-05-04 MED ORDER — DEXAMETHASONE SODIUM PHOSPHATE 10 MG/ML IJ SOLN
10.0000 mg | Freq: Once | INTRAMUSCULAR | Status: AC
  Administered 2021-05-04: 10 mg via INTRAVENOUS
  Filled 2021-05-04: qty 1

## 2021-05-04 NOTE — Assessment & Plan Note (Signed)
Cluster HA  START VERAPAMIL 80mg   TID Pt will follow up in 3 weeks to check his BP and  HR Pt referred to Dr -neurology Pt advised to go to the ED if symptoms gets worse.

## 2021-05-04 NOTE — Discharge Instructions (Signed)
Please take Tylenol, up to 650 mg every 6 hours as needed for headache, sumatriptan 1 tablet immediately and repeat in 2 hours if no better, please see your neurologist, stop your verapamil

## 2021-05-04 NOTE — ED Provider Notes (Signed)
[facility] EMERGENCY DEPARTMENT Provider Note   CSN: [id number] Arrival date & time: 05/04/21  2108     History  Chief Complaint  Patient presents with   Headache    X 3 days    Scott [name] is a 46 y.o. male.   Headache  Patient is a 46 year old male, has a history of what appears to be cluster headaches ever since he had his aortic valve replaced 2 years ago.  The patient states that he has been on metoprolol since that time.  He gets intermittent sharp and stabbing right-sided facial headaches that focused behind his eye with associated tearing that have intermittently appeared over the last couple of years.  He is currently in the middle of a 3-day history of headache, was seen by his family doctor today by virtual visit who started him on verapamil.  He states he has taken 2 doses, he has not had any relief of his headache.  No fever no chills no nausea  Home Medications Prior to Admission medications   Medication Sig Start Date End Date Taking? Authorizing Provider  SUMAtriptan (IMITREX) 50 MG tablet Take 1 tablet (50 mg total) by mouth every 2 (two) hours as needed for migraine. May repeat in 2 hours if headache persists or recurs. 05/04/21  Yes Scott [name]  acetaminophen (TYLENOL) 325 MG tablet Take 650 mg by mouth every 6 (six) hours as needed.    Provider, Historical, [name]  albuterol (VENTOLIN HFA) 108 (90 Base) MCG/ACT inhaler Inhale into the lungs. 12/31/19   Provider, Historical, [name]  cetirizine (ZYRTEC) 10 MG tablet Take 10 mg by mouth daily.    Provider, Historical, [name]  cyclobenzaprine (FLEXERIL) 10 MG tablet Take 1 tablet (10 mg total) by mouth 3 (three) times daily as needed for muscle spasms. 03/27/21   Dione BoozeGlick, David, [name]  hydrochlorothiazide (HYDRODIURIL) 25 MG tablet Take 25 mg by mouth daily.    Provider, Historical, [name]  metoprolol succinate (TOPROL-XL) 25 MG 24 hr tablet Take 25 mg by mouth daily. 10/29/20   Provider, Historical, [name]  mometasone (NASONEX) 50  MCG/ACT nasal spray Place 2 sprays into the nose daily. 10/15/20   Triplett, Tammy, PA-C  rosuvastatin (CRESTOR) 10 MG tablet Take 1 tablet (10 mg total) by mouth daily. 01/04/21 04/04/21  Pricilla Riffleoss, Paula V, [name]  warfarin (COUMADIN) 10 MG tablet Take 1 1/2 tablets by mouth daily or as directed by the coumadin clinic 05/01/21   Pricilla Riffleoss, Paula V, [name]      Allergies    Cocoa    Review of Systems   Review of Systems  Neurological:  Positive for headaches.  All other systems reviewed and are negative.  Physical Exam Updated Vital Signs BP 103/74    Pulse 60    Temp (!) 96.1 F (35.6 C) (Axillary)    Resp 12    Ht 1.803 m (5\' 11" )    Wt 74.8 kg    SpO2 96%    BMI 23.01 kg/m  Physical Exam Vitals and nursing note reviewed.  Constitutional:      General: He is not in acute distress.    Appearance: He is well-developed.  HENT:     Head: Normocephalic and atraumatic.     Mouth/Throat:     Pharynx: No oropharyngeal exudate.  Eyes:     General: No scleral icterus.       Right eye: No discharge.        Left eye: No discharge.  Conjunctiva/sclera: Conjunctivae normal.     Pupils: Pupils are equal, round, and reactive to light.  Neck:     Thyroid: No thyromegaly.     Vascular: No JVD.  Cardiovascular:     Rate and Rhythm: Normal rate and regular rhythm.     Heart sounds: Normal heart sounds. No murmur heard.   No friction rub. No gallop.  Pulmonary:     Effort: Pulmonary effort is normal. No respiratory distress.     Breath sounds: Normal breath sounds. No wheezing or rales.  Abdominal:     General: Bowel sounds are normal. There is no distension.     Palpations: Abdomen is soft. There is no mass.     Tenderness: There is no abdominal tenderness.  Musculoskeletal:        General: No tenderness. Normal range of motion.     Cervical back: Normal range of motion and neck supple.  Lymphadenopathy:     Cervical: No cervical adenopathy.  Skin:    General: Skin is warm and dry.     Findings: No  erythema or rash.  Neurological:     Mental Status: He is alert.     Coordination: Coordination normal.     Comments: Neurologic exam:  Speech clear, pupils equal round reactive to light, extraocular movements intact  Normal peripheral visual fields Cranial nerves III through XII normal including no facial droop Follows commands, moves all extremities x4, normal strength to bilateral upper and lower extremities at all major muscle groups including grip Sensation normal to light touch and pinprick Coordination intact, no limb ataxia, finger-nose-finger normal, heel shin normal bilaterally Rapid alternating movements normal No pronator drift Gait normal Can heal and toe walk without weakness.    Psychiatric:        Behavior: Behavior normal.    ED Results / Procedures / Treatments   Labs (all labs ordered are listed, but only abnormal results are displayed) Labs Reviewed - No data to display  EKG None  Radiology No results found.  Procedures Procedures    Medications Ordered in ED Medications  dexamethasone (DECADRON) injection 10 mg (10 mg Intravenous Given 05/04/21 2210)  ketorolac (TORADOL) 30 MG/ML injection 30 mg (30 mg Intravenous Given 05/04/21 2214)  sodium chloride 0.9 % bolus 1,000 mL (1,000 mLs Intravenous New Bag/Given 05/04/21 2221)  magnesium sulfate IVPB 2 g 50 mL (2 g Intravenous New Bag/Given 05/04/21 2219)    ED Course/ Medical Decision Making/ A&P                           Medical Decision Making The patient has a headache likely cluster, neurologic exam is totally reassuring and in the absence of focal neurologic symptoms or fevers or chills or abnormal vital signs I think this is consistent with cluster headache.  He will be treated with medications such as ketorolac, dexamethasone, magnesium sulfate and a fluid bolus.  He is agreeable, has follow-up with neurology planned  Amount and/or Complexity of Data Reviewed Independent Historian: caregiver     Details: family at the bedside corroborates the patient's Discussion of management or test interpretation with external provider(s): The patient was given multiple different medications including magnesium sulfate, Decadron, Toradol and IV fluids and states that he feels somewhat better.  He has not headache free but feels like he is more comfortable going home.  He is hemodynamically stable with unremarkable vital signs, both he and family member are aware of  the indications for return  Risk OTC drugs. Prescription drug management. Risk Details: Considered further advanced imaging with CT scan or MRI or admission to the [name] however the patient has very classic cluster headache syndrome, no neurologic symptoms and follow-up with neurology plan           Final Clinical Impression(s) / ED Diagnoses Final diagnoses:  Episodic cluster headache, not intractable    Rx / DC Orders ED Discharge Orders          Ordered    SUMAtriptan (IMITREX) 50 MG tablet  Every 2 hours PRN        05/04/21 2326              Scott Hong, [name] 05/04/21 2327

## 2021-05-04 NOTE — Progress Notes (Signed)
Virtual Visit via Telephone Note  I connected with Scott [name] on 05/04/21 at 1250 pm by telephone and verified that I am speaking with the correct person using two identifiers. I spent 7 minutes reviewing his chart and talking to him  Location: Patient: home Provider: office   I discussed the limitations, risks, security and privacy concerns of performing an evaluation and management service by telephone and the availability of in person appointments. I also discussed with the patient that there may be a patient responsible charge related to this service. The patient expressed understanding and agreed to proceed.   History of Present Illness: Pt c/o chronic cluster HA, he has tried taking tylenol but it dis not help. He stated that the injections given at the office during his previous visit was effective but the injection site was bruised for about a month. He has had 3 headaches since his last visit,. He was referred to neurology, he received a call from them but he did not schedule and appointment to be seen. Pt denies fever chills, recent changes in his vision ,CP, SOB. He has seen an opthalmology he will getting    Observations/Objective:   Assessment and Plan: Cluster HA  START VERAPAMIL 80mg   TID Pt will follow up in 3 weeks to check his BP and  HR Pt referred to Dr Scott [name] -neurology Pt advised to go to the ED if symptoms gets worse.   Follow Up Instructions:    I discussed the assessment and treatment plan with the patient. The patient was provided an opportunity to ask questions and all were answered. The patient agreed with the plan and demonstrated an understanding of the instructions.   The patient was advised to call back or seek an in-person evaluation if the symptoms worsen or if the condition fails to improve as anticipated.

## 2021-05-04 NOTE — ED Triage Notes (Signed)
Pt chronic cluster HA, he has tried taking tylenol but it dis not help. He stated that the injections given at PCP was effective but the injection site was bruised for about a month. He has had 3 headaches since his last visit at PCP He was referred to neurology, he received a call from them but he did not schedule and appointment to be seen. Pt denies fever chills, recent changes in his vision ,CP, SOB. Pt was seen by PCP today and Rx verapamil and has taken 2 x today.

## 2021-05-05 ENCOUNTER — Encounter (HOSPITAL_COMMUNITY): Payer: Self-pay | Admitting: *Deleted

## 2021-05-05 ENCOUNTER — Emergency Department (HOSPITAL_COMMUNITY)
Admission: EM | Admit: 2021-05-05 | Discharge: 2021-05-05 | Disposition: A | Discharge status: Home / Self Care | Payer: Medicaid Other | Attending: Emergency Medicine | Admitting: Emergency Medicine

## 2021-05-05 ENCOUNTER — Telehealth: Payer: Self-pay

## 2021-05-05 DIAGNOSIS — R519 Headache, unspecified: Secondary | ICD-10-CM | POA: Insufficient documentation

## 2021-05-05 DIAGNOSIS — Z5321 Procedure and treatment not carried out due to patient leaving prior to being seen by health care provider: Secondary | ICD-10-CM | POA: Insufficient documentation

## 2021-05-05 NOTE — Telephone Encounter (Signed)
Transition Care Management Unsuccessful Follow-up Telephone Call ° °Date of discharge and from where:  05/04/2021 from [facility] ° °Attempts:  1st Attempt ° °Reason for unsuccessful TCM follow-up call:  Left voice message ° ° ° °

## 2021-05-05 NOTE — ED Triage Notes (Signed)
Headache seen last night for same, no improvement

## 2021-05-08 NOTE — Telephone Encounter (Signed)
Transition Care Management Unsuccessful Follow-up Telephone Call ° °Date of discharge and from where:  05/04/2021-[place]  ° °Attempts:  2nd Attempt ° °Reason for unsuccessful TCM follow-up call:  Left voice message ° °  °

## 2021-05-09 NOTE — Telephone Encounter (Signed)
Transition Care Management Unsuccessful Follow-up Telephone Call ° °Date of discharge and from where:  05/04/2021 from [facility] ° °Attempts:  3rd Attempt ° °Reason for unsuccessful TCM follow-up call:  Unable to reach patient ° ° ° °

## 2021-05-17 ENCOUNTER — Other Ambulatory Visit: Payer: Self-pay

## 2021-05-17 ENCOUNTER — Encounter: Payer: Self-pay | Admitting: Nurse Practitioner

## 2021-05-17 ENCOUNTER — Ambulatory Visit (INDEPENDENT_AMBULATORY_CARE_PROVIDER_SITE_OTHER): Payer: Medicaid Other | Admitting: Nurse Practitioner

## 2021-05-17 VITALS — BP 102/72 | HR 66 | Ht 71.0 in | Wt 168.0 lb

## 2021-05-17 DIAGNOSIS — J329 Chronic sinusitis, unspecified: Secondary | ICD-10-CM | POA: Diagnosis not present

## 2021-05-17 DIAGNOSIS — G44029 Chronic cluster headache, not intractable: Secondary | ICD-10-CM

## 2021-05-17 MED ORDER — TOPIRAMATE 25 MG PO TABS: 25.0000 mg | ORAL_TABLET | Freq: Every day | ORAL | 1 refills | Status: DC

## 2021-05-17 NOTE — Assessment & Plan Note (Signed)
He just completed a course of amoxicillin  takes zyrtec 10mg  daily, mometasone 50 mcg daily Refer to ENT.

## 2021-05-17 NOTE — Patient Instructions (Addendum)
Take topiramate 25mg  daily for your migraine Please follow up with neurology as discussed.   It is important that you exercise regularly at least 30 minutes 5 times a week.  Think about what you will eat, plan ahead. Choose " clean, green, fresh or frozen" over canned, processed or packaged foods which are more sugary, salty and fatty. 70 to 75% of food eaten should be vegetables and fruit. Three meals at set times with snacks allowed between meals, but they must be fruit or vegetables. Aim to eat over a 12 hour period , example 7 am to 7 pm, and STOP after  your last meal of the day. Drink water,generally about 64 ounces per day, no other drink is as healthy. Fruit juice is best enjoyed in a healthy way, by EATING the fruit.  Thanks for choosing [facility], we consider it a privelige to serve you.

## 2021-05-17 NOTE — Progress Notes (Signed)
° °  Emeka Lindner     MRN: [id number]      DOB: 1975-09-08   HPI Mr. Althaus is here  c/o left sided face pain that started over a month ago, he was seen by a dentist  who ordered amoxicillin, he  completed the prescribed of antibiotics  two days ago.  he was told by the dentist that he has some decay, has a follow up appointment with the dentist for some kind of dental procedure. Pain is 7/10, sharp , stabbing constant pain.  Tylenol is not helping his pain. He has an upcoming appointment with the neurologist in 2 weeksy. He went to the  ERl he was told to stop taking verapamil due to low heart rate. He was given decadron, Toradol injection, he went back to the ER next day because he was not feeling better, after then he went to the dentist.    He got a new prescription for eye glasses three weeks ago.  CHRONIC SINUSITIS. He has some sinus drainage, stuffy nose , sinus drainage is bloody. Takes  cetirizine 10mg  daily, mometasone nasal spray , both medications not helping.   ROS Denies recent fever or chills. HAS sinus pressure, nasal congestion, denies ear pain or sore throat. Denies chest congestion, productive cough or wheezing. Denies chest pains, palpitations and leg swelling Denies abdominal pain, nausea, vomiting,diarrhea or constipation.   Denies dysuria, frequency, hesitancy or incontinence. Denies joint pain, swelling and limitation in mobility. Has chronic headaches, denies seizures, numbness, or tingling.    PE  BP 102/72 (BP Location: Left Arm, Patient Position: Sitting, Cuff Size: Normal)    Pulse 66    Ht 5\' 11"  (1.803 m)    Wt 168 lb (76.2 kg)    SpO2 98%    BMI 23.43 kg/m   Patient alert and oriented and in no cardiopulmonary distress.  HEENT: No facial asymmetry, EOMI,     Neck supple   Chest: Clear to auscultation bilaterally.  CVS: S1, S2 no murmurs, no S3.Regular rate.  ABD: Soft non tender.   Ext: No edema  MS: Adequate ROM spine, shoulders, hips and  knees.  Psych: Good eye contact, normal affect. Memory intact not anxious or depressed appearing.  CNS: CN 2-12 intact, power,  normal throughout.no focal deficits noted.   Assessment & Plan

## 2021-05-17 NOTE — Assessment & Plan Note (Addendum)
Start topiramate 25 mg daily , follow up with neurology , pt educated on the need to take med daily. Continue tylenol and imitrex as needed.  Pt advised to go back to the ER if his symptoms gets worse before seeing a neurologist, he verbalized understanding.

## 2021-05-18 ENCOUNTER — Other Ambulatory Visit: Payer: Self-pay | Admitting: Nurse Practitioner

## 2021-05-18 DIAGNOSIS — G44029 Chronic cluster headache, not intractable: Secondary | ICD-10-CM

## 2021-05-22 ENCOUNTER — Other Ambulatory Visit: Payer: Self-pay | Admitting: Internal Medicine

## 2021-05-22 NOTE — Telephone Encounter (Signed)
Warfarin refilled on 05/01/21 by Vashti Hey, RN for #45 tablets with 2 additional refills.  Pt should have plenty of Warfarin to get to upcoming appt on 05/24/21.  Will forward refill to [facility] [employer], can refill at [facility] on 05/24/21 if pt comes to scheduled f/u appt.  Looks like pt has tendency to miss f/u INR checks.

## 2021-05-24 ENCOUNTER — Ambulatory Visit (INDEPENDENT_AMBULATORY_CARE_PROVIDER_SITE_OTHER): Payer: Medicaid Other | Admitting: *Deleted

## 2021-05-24 ENCOUNTER — Other Ambulatory Visit: Payer: Self-pay

## 2021-05-24 ENCOUNTER — Other Ambulatory Visit: Payer: Self-pay | Admitting: Pharmacist

## 2021-05-24 DIAGNOSIS — Z5181 Encounter for therapeutic drug level monitoring: Secondary | ICD-10-CM | POA: Diagnosis not present

## 2021-05-24 DIAGNOSIS — Z952 Presence of prosthetic heart valve: Secondary | ICD-10-CM | POA: Diagnosis not present

## 2021-05-24 LAB — POCT INR: INR: 2.8 (ref 2.0–3.0)

## 2021-05-24 MED ORDER — AMOXICILLIN 500 MG PO CAPS: ORAL_CAPSULE | ORAL | 3 refills | Status: DC

## 2021-05-24 NOTE — Patient Instructions (Signed)
Continue warfarin 15mg  daily.  Pending dental work .  Will take Amoxicillin SBE prior to dental work. Recheck INR in 3 weeks.

## 2021-05-25 ENCOUNTER — Encounter: Payer: Self-pay | Admitting: Internal Medicine

## 2021-05-25 ENCOUNTER — Ambulatory Visit: Payer: Medicaid Other | Admitting: Nurse Practitioner

## 2021-05-25 ENCOUNTER — Telehealth: Payer: Self-pay | Admitting: *Deleted

## 2021-05-25 ENCOUNTER — Other Ambulatory Visit: Payer: Self-pay | Admitting: Nurse Practitioner

## 2021-05-25 DIAGNOSIS — G44029 Chronic cluster headache, not intractable: Secondary | ICD-10-CM

## 2021-05-25 NOTE — Telephone Encounter (Signed)
Patients wife called the office in regards to the clearance request. She reports they are in the dentist office waiting on the fax to be sent back. Reports it was sent to the [facility] coumadin clinic.

## 2021-05-25 NOTE — Telephone Encounter (Signed)
I s/w Brianna with [facility]. I told her that we needed clarification on dental work listed on form. Pt has a multi Tx plan in place with Dr. Rodena Goldmann, DDS. I explained that we cannot provide a blanket type clearance due to pt is on coumadin. We cannot hold blood thinners excessively longer than needed without putting the pt at risk. I informed that I send over to our pre op provider for the 1st Tx plan. When their office is ready for the next tx plan they will need to fax over a clearance request for that Tx to be done.     Pre-operative Risk Assessment    Patient Name: Mc Bloodworth  DOB: Mar 31, 1976 MRN: [id number]     Request for Surgical Clearance     Procedure:   7 FILLINGS; NOT TEETH TO BE EXTRACTED AT THIS TIME  Date of Surgery:  Clearance 06/06/21                                 Surgeon:  DR. Rodena Goldmann, DDS Surgeon's Group or Practice Name:  [facility]  Phone number:  [phone] Fax number:  [phone]   Type of Clearance Requested:   - Medical  - Pharmacy:  Hold Warfarin (Coumadin)     Type of Anesthesia:  Local    Additional requests/questions:    Elpidio Anis   05/25/2021, 2:30 PM

## 2021-05-25 NOTE — Telephone Encounter (Signed)
No need to hold warfarin for fillings

## 2021-05-26 ENCOUNTER — Other Ambulatory Visit: Payer: Self-pay | Admitting: Nurse Practitioner

## 2021-05-26 ENCOUNTER — Telehealth: Payer: Self-pay

## 2021-05-26 DIAGNOSIS — G44029 Chronic cluster headache, not intractable: Secondary | ICD-10-CM

## 2021-05-26 MED ORDER — AMOXICILLIN 500 MG PO CAPS: ORAL_CAPSULE | ORAL | 3 refills | Status: DC

## 2021-05-26 NOTE — Telephone Encounter (Signed)
Please inform the patient our clinical pharmacist recommendation.  He does not need to hold Coumadin prior to dental filling procedure.  However due to his previous cardiac valve surgery, he does need antibiotic prior to any dental procedure.  Prescription of amoxicillin has been sent to his pharmacy.  He will need to take 2000 mg (which is 4 of the 500 mg capsule) 30 to 60 minutes prior to any dental procedure

## 2021-05-26 NOTE — Telephone Encounter (Signed)
Patient was previously cleared for dental filling procedure this morning. (See separate cardiac clearance letter) He required antibiotic for prophylaxis prior to the procedure.  But now we have received another cardiac clearance that includes Crowns and bridges.  This is different from the cardiac clearance we have received yesterday which only mentioned fillings.  Please check with the requesting provider as we need to know specifically what type of procedure is being done and how many teeth are being involved.

## 2021-05-26 NOTE — Addendum Note (Signed)
Addended by: Tarri Fuller on: 05/26/2021 11:55 AM   Modules accepted: Orders

## 2021-05-26 NOTE — Telephone Encounter (Signed)
I left a very detailed message for the pt that he has been cleared for the dental procedure to be done at this time, a dental filling. Pt will need refill on SBE as he had taken yesterday when he went to the DDS office. No dental work was performed on the pt yesterday as pt is on coumadin and dental office needed clearance. See previous notes from yesterday.   I will send notes to requesting office .

## 2021-05-26 NOTE — Telephone Encounter (Signed)
° °  Patient Name: Scott [name]  DOB: 18-Jan-1976 MRN: [id number]  Primary Cardiologist: Dietrich Pates, MD  Chart reviewed as part of pre-operative protocol coverage.   Simple (1-2 teeth) dental extractions, cleaning and filling are considered low risk procedures per guidelines and generally do not require any specific cardiac clearance. It is also generally accepted that for simple extractions and dental cleanings, there is no need to interrupt blood thinner therapy.   SBE prophylaxis is required for the patient from a cardiac standpoint. 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  He will need SBE prophylaxis.  Antibiotic amoxicillin has been sent to his pharmacy.  He will need to take 4 capsules of 500 mg amoxicillin (total of 2000 mg) 30 to 60 minutes prior to any dental procedure.  I will route this recommendation to the requesting party via Epic fax function and remove from pre-op pool.  Please call with questions.  [place], [place] 05/26/2021, 11:30 AM

## 2021-05-26 NOTE — Telephone Encounter (Signed)
I called the dental office and left very detailed message that we cannot provide a blanket type clearance as per my conversation yesterday with the office. If the pt has a multi Tx plan in place with the dentist we need a clearance request for only the first procedure that is being done. Each Tx plan has to be sent over stating only for the Tx plan.  Example: if the next Tx is a tooth extraction then that is what the clearance request will need to come over and how many teeth the pt would be having extracted.   Please call our office so that we may discuss further to be sure that we are all on the same page. Please ask to s/w the pre op team [phone]. I want to be sure that we take care of everything your office needs.    The conversation yesterday was that I was told the 1st Tx is for 7 fillings to be done. Our office faxed over clearance earlier today for that procedure. A refill on the pt's SBE was also sent in today.

## 2021-05-26 NOTE — Telephone Encounter (Signed)
° °  Pre-operative Risk Assessment    Patient Name: Scott [name]  DOB: February 07, 1976 MRN: [id number]     Request for Surgical Clearance    Procedure:   Fillings, Crowns, Bridges.  Date of Surgery:  Clearance TBD                                 Surgeon:  Not Listed Surgeon's Group or Practice Name:  [facility] Phone number:  [phone] Fax number:  [phone]   Type of Clearance Requested:   - Medical    Type of Anesthesia:   Local   Additional requests/questions:  Does this patient need antibiotics? Please fax a copy of Clerance to the surgeon's office.  Signed, Ivin Booty   05/26/2021, 2:00 PM

## 2021-05-29 NOTE — Telephone Encounter (Signed)
I s/w dental office and confirmed Tx plan is for 7 fillings at this time under local anesthesia.

## 2021-05-29 NOTE — Telephone Encounter (Addendum)
Per d/w C. Fiato, this clearance was confirmed to be the same clearance acted upon in 05/25/21 phone note and faxed, no further action necessary. SBE ppx addressed yesterday - C. Fiato contacted patient 05/26/21 and left detailed message to let him know he was cleared and that SBE ppx abx were sent into his pharmacy to take prior to dental work. This was also outlined in clearance to dentist as well. Will remove from preop box.

## 2021-05-31 ENCOUNTER — Encounter (HOSPITAL_COMMUNITY): Payer: Self-pay

## 2021-05-31 ENCOUNTER — Emergency Department (HOSPITAL_COMMUNITY): Payer: Medicaid Other

## 2021-05-31 ENCOUNTER — Other Ambulatory Visit: Payer: Self-pay

## 2021-05-31 ENCOUNTER — Ambulatory Visit (HOSPITAL_COMMUNITY)
Admission: RE | Admit: 2021-05-31 | Discharge: 2021-05-31 | Disposition: A | Discharge status: Home / Self Care | Payer: Medicaid Other | Source: Ambulatory Visit | Attending: Nurse Practitioner | Admitting: Nurse Practitioner

## 2021-05-31 ENCOUNTER — Emergency Department (HOSPITAL_COMMUNITY)
Admission: EM | Admit: 2021-05-31 | Discharge: 2021-05-31 | Disposition: A | Discharge status: Home / Self Care | Payer: Medicaid Other | Attending: Emergency Medicine | Admitting: Emergency Medicine

## 2021-05-31 DIAGNOSIS — M5030 Other cervical disc degeneration, unspecified cervical region: Secondary | ICD-10-CM | POA: Insufficient documentation

## 2021-05-31 DIAGNOSIS — Z952 Presence of prosthetic heart valve: Secondary | ICD-10-CM | POA: Insufficient documentation

## 2021-05-31 DIAGNOSIS — G44029 Chronic cluster headache, not intractable: Secondary | ICD-10-CM

## 2021-05-31 DIAGNOSIS — Z7901 Long term (current) use of anticoagulants: Secondary | ICD-10-CM | POA: Diagnosis not present

## 2021-05-31 DIAGNOSIS — M542 Cervicalgia: Secondary | ICD-10-CM

## 2021-05-31 DIAGNOSIS — Z79899 Other long term (current) drug therapy: Secondary | ICD-10-CM | POA: Diagnosis not present

## 2021-05-31 MED ORDER — PREDNISONE 50 MG PO TABS
60.0000 mg | ORAL_TABLET | Freq: Once | ORAL | Status: AC
  Administered 2021-05-31: 60 mg via ORAL
  Filled 2021-05-31: qty 1

## 2021-05-31 MED ORDER — OXYCODONE HCL 5 MG PO TABS
5.0000 mg | ORAL_TABLET | Freq: Once | ORAL | Status: AC
Start: 2021-05-31 — End: 2021-05-31
  Administered 2021-05-31: 5 mg via ORAL
  Filled 2021-05-31: qty 1

## 2021-05-31 MED ORDER — TRAMADOL HCL 50 MG PO TABS: 50.0000 mg | ORAL_TABLET | Freq: Four times a day (QID) | ORAL | 0 refills | Status: DC | PRN

## 2021-05-31 MED ORDER — METHOCARBAMOL 750 MG PO TABS: 750.0000 mg | ORAL_TABLET | Freq: Three times a day (TID) | ORAL | 0 refills | Status: DC | PRN

## 2021-05-31 MED ORDER — PREDNISONE 20 MG PO TABS: ORAL_TABLET | ORAL | 0 refills | Status: DC

## 2021-05-31 MED ORDER — ACETAMINOPHEN 500 MG PO TABS
1000.0000 mg | ORAL_TABLET | Freq: Once | ORAL | Status: AC
  Administered 2021-05-31: 1000 mg via ORAL
  Filled 2021-05-31: qty 2

## 2021-05-31 NOTE — ED Triage Notes (Signed)
Neck pain x 3 days, hx of degenerative disk dz. Last xray several years ago. Denies trauma. Numbness and tingling in bilateral arms L>R.

## 2021-05-31 NOTE — ED Provider Notes (Signed)
[facility] EMERGENCY DEPARTMENT Provider Note   CSN: [id number] Arrival date & time: 05/31/21  1823     History  Chief Complaint  Patient presents with   Neck Pain    Scott [name] is a 46 y.o. male.  Patient with hx ddd, c/o neck pain in past few days. States pain in neck, laterally/posteriorly, occasionally radiating towards shoulder. Denies known neck injury or strain. Occasional tingling sensation. No weakness or loss of normal function. Denies any anterior neck pain. No trouble breathing or swallowing. Denies headache. No nausea or vomiting. No chest pain or discomfort. No sob or unusual doe. Takes coumadin, indicates compliant w  rx, denies abnormal bruising or bleeding.   The history is provided by the patient and medical records.  Neck Pain Associated symptoms: no chest pain, no fever, no headaches and no weakness       Home Medications Prior to Admission medications   Medication Sig Start Date End Date Taking? Authorizing Provider  acetaminophen (TYLENOL) 325 MG tablet Take 650 mg by mouth every 6 (six) hours as needed.    [provider]  albuterol (VENTOLIN HFA) 108 (90 Base) MCG/ACT inhaler Inhale into the lungs. Patient not taking: Reported on 05/17/2021 12/31/19   [provider]  amoxicillin (AMOXIL) 500 MG capsule Take 4 capsules (2,000mg ) by mouth 30-60 minutes prior to dental cleaning/work 05/26/21   [name], MD  cetirizine (ZYRTEC) 10 MG tablet Take 10 mg by mouth daily.    [provider]  cyclobenzaprine (FLEXERIL) 10 MG tablet Take 1 tablet (10 mg total) by mouth 3 (three) times daily as needed for muscle spasms. 03/27/21   Scott Booze, MD  hydrochlorothiazide (HYDRODIURIL) 25 MG tablet Take 25 mg by mouth daily.    [provider]  metoprolol succinate (TOPROL-XL) 25 MG 24 hr tablet Take 25 mg by mouth daily. 10/29/20   [provider]  mometasone (NASONEX) 50 MCG/ACT nasal spray Place 2 sprays into the nose  daily. 10/15/20   [name], Tammy, PA-C  rosuvastatin (CRESTOR) 10 MG tablet Take 1 tablet (10 mg total) by mouth daily. 01/04/21 04/04/21  [name], MD  SUMAtriptan (IMITREX) 50 MG tablet Take 1 tablet (50 mg total) by mouth every 2 (two) hours as needed for migraine. May repeat in 2 hours if headache persists or recurs. Patient not taking: Reported on 05/17/2021 05/04/21   Scott Hong, MD  topiramate (TOPAMAX) 25 MG tablet TAKE 1 TABLET (25 MG TOTAL) BY MOUTH DAILY. 05/25/21   [name], Scott Kay, FNP  verapamil (CALAN) 80 MG tablet Take 80 mg by mouth 3 (three) times daily. Patient not taking: Reported on 05/17/2021 05/04/21   [provider]  warfarin (COUMADIN) 10 MG tablet Take 1 1/2 tablets by mouth daily or as directed by the coumadin clinic 05/01/21   [name], MD      Allergies    Cocoa    Review of Systems   Review of Systems  Constitutional:  Negative for fever.  HENT:  Negative for trouble swallowing.   Respiratory:  Negative for shortness of breath.   Cardiovascular:  Negative for chest pain.  Gastrointestinal:  Negative for nausea and vomiting.  Musculoskeletal:  Positive for neck pain.  Skin:  Negative for rash.  Neurological:  Negative for speech difficulty, weakness and headaches.   Physical Exam Updated Vital Signs BP 127/76 (BP Location: Right Arm)    Pulse 61    Temp 98.8 F (37.1 C) (Oral)  Resp 20    Wt 74.8 kg    SpO2 98%    BMI 23.01 kg/m  Physical Exam Vitals and nursing note reviewed.  Constitutional:      Appearance: Normal appearance. He is well-developed.  HENT:     Head: Atraumatic.     Nose: Nose normal.     Mouth/Throat:     Mouth: Mucous membranes are moist.  Eyes:     General: No scleral icterus.    Conjunctiva/sclera: Conjunctivae normal.     Pupils: Pupils are equal, round, and reactive to light.  Neck:     Trachea: No tracheal deviation.     Comments: Trapezius muscular tenderness, no sts or skin changes. No midline  tenderness. No neck stiffness or rigidity.  Cardiovascular:     Rate and Rhythm: Normal rate and regular rhythm.     Pulses: Normal pulses.  Pulmonary:     Effort: Pulmonary effort is normal. No accessory muscle usage or respiratory distress.     Breath sounds: Normal breath sounds.  Abdominal:     General: There is no distension.     Tenderness: There is no abdominal tenderness.  Genitourinary:    Comments: No cva tenderness. Musculoskeletal:        General: No swelling.     Cervical back: Normal range of motion and neck supple. No rigidity.     Comments: C spine non tender, aligned.   Skin:    General: Skin is warm and dry.     Findings: No erythema or rash.  Neurological:     Mental Status: He is alert.     Comments: Alert, speech clear. Motor intact bil, stre 5/5. Sens grossly intact. Steady gait.   Psychiatric:        Mood and Affect: Mood normal.    ED Results / Procedures / Treatments   Labs (all labs ordered are listed, but only abnormal results are displayed) Labs Reviewed - No data to display  EKG None  Radiology DG Cervical Spine 2-3 Views  Result Date: 05/31/2021 CLINICAL DATA:  Neck pain EXAM: CERVICAL SPINE - 2-3 VIEW COMPARISON:  None. FINDINGS: There is no evidence of cervical spine fracture or prevertebral soft tissue swelling. Mild degenerative disc disease. Alignment is normal. No other significant bone abnormalities are identified. IMPRESSION: Mild degenerative disc disease. Electronically Signed   By: Deatra Robinson M.D.   On: 05/31/2021 20:45    Procedures Procedures    Medications Ordered in ED Medications  predniSONE (DELTASONE) tablet 60 mg (has no administration in time range)  acetaminophen (TYLENOL) tablet 1,000 mg (has no administration in time range)    ED Course/ Medical Decision Making/ A&P                           Medical Decision Making Problems Addressed: Degenerative disc disease, cervical: chronic illness or injury with  exacerbation, progression, or side effects of treatment History of aortic valve replacement: chronic illness or injury    Details: anticoag therapy Neck pain: acute illness or injury  Amount and/or Complexity of Data Reviewed External Data Reviewed: notes. Radiology: ordered and independent interpretation performed. Decision-making details documented in ED Course.  Risk OTC drugs. Prescription drug management.   Xrays.   Reviewed nursing notes and prior charts for additional history. External reports reviewed.   Prednisone po. Acetaminophen po.   Xrays reviewed/interpreted by me - mild degen changes.   Pt appears stable for d/c.  Rec pcp f/u.          Final Clinical Impression(s) / ED Diagnoses Final diagnoses:  None    Rx / DC Orders ED Discharge Orders     None         Cathren Laine, MD 05/31/21 2142

## 2021-05-31 NOTE — Discharge Instructions (Addendum)
It was our pleasure to provide your ER care today - we hope that you feel better.  Take prednisone as prescribed. May take robaxin as need for muscle pain/spasm - no driving when taking.  You may also take ultram as need for pain. You may try heat therapy as need. You may try gentle massage and try pain patches (over the counter).   Follow up with primary care doctor in 1-2 weeks.   Return to ER if worse, new symptoms, fevers, new/severe pain, numbness/weakness, or other concern.

## 2021-06-01 ENCOUNTER — Telehealth: Payer: Self-pay

## 2021-06-01 NOTE — Telephone Encounter (Signed)
Transition Care Management Follow-up Telephone Call Date of discharge and from where: 05/31/2021-[place]  How have you been since you were released from the hospital? Pt stated he is doing better but still has a little pain  Any questions or concerns? No  Items Reviewed: Did the pt receive and understand the discharge instructions provided? Yes  Medications obtained and verified? Yes  Other? No  Any new allergies since your discharge? No  Dietary orders reviewed? No Do you have support at home? Yes   Home Care and Equipment/Supplies: Were home health services ordered? not applicable If so, what is the name of the agency? N/A  Has the agency set up a time to come to the patient's home? not applicable Were any new equipment or medical supplies ordered?  No What is the name of the medical supply agency? N/A Were you able to get the supplies/equipment? not applicable Do you have any questions related to the use of the equipment or supplies? No  Functional Questionnaire: (I = Independent and D = Dependent) ADLs: I  Bathing/Dressing- I  Meal Prep- I  Eating- I  Maintaining continence- I  Transferring/Ambulation- I  Managing Meds- I  Follow up appointments reviewed:  PCP Hospital f/u appt confirmed? No   Specialist Hospital f/u appt confirmed? No   Are transportation arrangements needed? No  If their condition worsens, is the pt aware to call PCP or go to the Emergency Dept.? Yes Was the patient provided with contact information for the PCP's office or ED? Yes Was to pt encouraged to call back with questions or concerns? Yes

## 2021-06-14 ENCOUNTER — Ambulatory Visit (INDEPENDENT_AMBULATORY_CARE_PROVIDER_SITE_OTHER): Payer: Medicaid Other | Admitting: *Deleted

## 2021-06-14 DIAGNOSIS — Z5181 Encounter for therapeutic drug level monitoring: Secondary | ICD-10-CM | POA: Diagnosis not present

## 2021-06-14 DIAGNOSIS — Z952 Presence of prosthetic heart valve: Secondary | ICD-10-CM

## 2021-06-14 LAB — POCT INR: INR: 5.5 — AB (ref 2.0–3.0)

## 2021-06-14 NOTE — Patient Instructions (Signed)
Hold warfarin tonight and tomorrow night.  Take 1 tablet Friday night then resume 1 1/2 tablets daily.  ?Was on prednisone taper for cervical back pain.  Finished now. ?Recheck INR in 1 week ?

## 2021-06-23 ENCOUNTER — Ambulatory Visit (INDEPENDENT_AMBULATORY_CARE_PROVIDER_SITE_OTHER): Payer: Medicaid Other | Admitting: *Deleted

## 2021-06-23 ENCOUNTER — Other Ambulatory Visit: Payer: Self-pay

## 2021-06-23 DIAGNOSIS — Z952 Presence of prosthetic heart valve: Secondary | ICD-10-CM

## 2021-06-23 DIAGNOSIS — Z5181 Encounter for therapeutic drug level monitoring: Secondary | ICD-10-CM

## 2021-06-23 DIAGNOSIS — G44029 Chronic cluster headache, not intractable: Secondary | ICD-10-CM | POA: Diagnosis not present

## 2021-06-23 LAB — POCT INR: INR: 3.6 — AB (ref 2.0–3.0)

## 2021-06-23 NOTE — Patient Instructions (Signed)
Hold warfarin tonight then decrease dose to 1 1/2 tablets daily except 1 tablet on Tuesdays and Fridays.Marland Kitchen  ?Recheck INR in 3 weeks ?

## 2021-06-24 LAB — CBC WITH DIFFERENTIAL/PLATELET
Basophils Absolute: 0.1 10*3/uL (ref 0.0–0.2)
Basos: 1 %
EOS (ABSOLUTE): 0.2 10*3/uL (ref 0.0–0.4)
Eos: 3 %
Hematocrit: 45.3 % (ref 37.5–51.0)
Hemoglobin: 15.4 g/dL (ref 13.0–17.7)
Immature Grans (Abs): 0 10*3/uL (ref 0.0–0.1)
Immature Granulocytes: 0 %
Lymphocytes Absolute: 2.4 10*3/uL (ref 0.7–3.1)
Lymphs: 26 %
MCH: 31.4 pg (ref 26.6–33.0)
MCHC: 34 g/dL (ref 31.5–35.7)
MCV: 92 fL (ref 79–97)
Monocytes Absolute: 1.1 10*3/uL — ABNORMAL HIGH (ref 0.1–0.9)
Monocytes: 12 %
Neutrophils Absolute: 5.3 10*3/uL (ref 1.4–7.0)
Neutrophils: 58 %
Platelets: 318 10*3/uL (ref 150–450)
RBC: 4.9 x10E6/uL (ref 4.14–5.80)
RDW: 12.9 % (ref 11.6–15.4)
WBC: 9.1 10*3/uL (ref 3.4–10.8)

## 2021-06-24 LAB — CMP14+EGFR
ALT: 23 IU/L (ref 0–44)
AST: 20 IU/L (ref 0–40)
Albumin/Globulin Ratio: 1.8 (ref 1.2–2.2)
Albumin: 4.5 g/dL (ref 4.0–5.0)
Alkaline Phosphatase: 127 IU/L — ABNORMAL HIGH (ref 44–121)
BUN/Creatinine Ratio: 11 (ref 9–20)
BUN: 11 mg/dL (ref 6–24)
Bilirubin Total: <0.2 mg/dL (ref 0.0–1.2)
CO2: 26 mmol/L (ref 20–29)
Calcium: 9.7 mg/dL (ref 8.7–10.2)
Chloride: 98 mmol/L (ref 96–106)
Creatinine, Ser: 0.97 mg/dL (ref 0.76–1.27)
Globulin, Total: 2.5 g/dL (ref 1.5–4.5)
Glucose: 101 mg/dL — ABNORMAL HIGH (ref 70–99)
Potassium: 4 mmol/L (ref 3.5–5.2)
Sodium: 147 mmol/L — ABNORMAL HIGH (ref 134–144)
Total Protein: 7 g/dL (ref 6.0–8.5)
eGFR: 99 mL/min/{1.73_m2} (ref >59)

## 2021-06-24 LAB — LIPID PANEL
Chol/HDL Ratio: 4.4 ratio (ref 0.0–5.0)
Cholesterol, Total: 132 mg/dL (ref 100–199)
HDL: 30 mg/dL — ABNORMAL LOW (ref >39)
LDL Chol Calc (NIH): 80 mg/dL (ref 0–99)
Triglycerides: 119 mg/dL (ref 0–149)
VLDL Cholesterol Cal: 22 mg/dL (ref 5–40)

## 2021-06-25 NOTE — Progress Notes (Signed)
Patient has an upcoming appointment with me, I will review labs with him at that visit.

## 2021-06-28 ENCOUNTER — Encounter: Payer: Self-pay | Admitting: Nurse Practitioner

## 2021-06-28 ENCOUNTER — Ambulatory Visit (INDEPENDENT_AMBULATORY_CARE_PROVIDER_SITE_OTHER): Payer: Medicaid Other | Admitting: Nurse Practitioner

## 2021-06-28 ENCOUNTER — Other Ambulatory Visit: Payer: Self-pay

## 2021-06-28 VITALS — BP 113/66 | HR 72 | Ht 71.0 in | Wt 160.0 lb

## 2021-06-28 DIAGNOSIS — R748 Abnormal levels of other serum enzymes: Secondary | ICD-10-CM | POA: Diagnosis not present

## 2021-06-28 DIAGNOSIS — R0609 Other forms of dyspnea: Secondary | ICD-10-CM | POA: Diagnosis not present

## 2021-06-28 DIAGNOSIS — F419 Anxiety disorder, unspecified: Secondary | ICD-10-CM | POA: Diagnosis not present

## 2021-06-28 DIAGNOSIS — G44029 Chronic cluster headache, not intractable: Secondary | ICD-10-CM

## 2021-06-28 DIAGNOSIS — F32A Depression, unspecified: Secondary | ICD-10-CM | POA: Diagnosis not present

## 2021-06-28 NOTE — Assessment & Plan Note (Signed)
Has upcoming echocardiogram ?Patient encouraged to maintain close follow-up with cardiology he verbalized understanding ?

## 2021-06-28 NOTE — Patient Instructions (Signed)

## 2021-06-28 NOTE — Assessment & Plan Note (Signed)
ALP 127, denies drinking alcohol ?Most likely due to taking Tylenol for HA ?Patient educated on the need to not take more than 3000 mg of Tylenol in 24 hours, he verbalized understanding ?

## 2021-06-28 NOTE — Progress Notes (Signed)
? ?  Scott [name]     MRN: [id number]      DOB: Mar 12, 1976 ? ? ?HPI ?Mr. [name] with medical history of cluster headaches, DOE, status post aortic valve replacement is here for follow up for cluster headaches. ?Patient stated that neurology ordered MRI but they could not do MRI due to him having earings in the left ear, he had dental procedure done 3 weeks, he has not been having migraine HA since having dental procedure, when he has HA now he takes tylenol 1000mg  and it helps. [facility] [place].  Patient states that he still has further dental procedure coming up.  ? ? ?Patient complains of chronic shortness of breath, feels bad 90% of the time , since after he had aortic valve replacement, states that he has upcoming echo cardiogram. ? ?Patient states that he feels deprssed and anxiuos about his heart conditions. He was previosuly taking medications for depression and anxiety, pt denies SI, HI.  Patient would like a referral for counseling.  Patient states that he will check with cardiology about anxiety and depresion medications which are compatible with Coumadin. ? ? ? ?ROS ?Denies recent fever or chills. ?Denies sinus pressure, nasal congestion, ear pain or sore throat. ?Denies chest congestion, productive cough or wheezing. ?Denies chest pains, palpitations and leg swelling, has chronic SOB ?Denies abdominal pain, nausea, vomiting,diarrhea or constipation.   ?Denies dysuria, frequency, hesitancy or incontinence. ?Denies joint pain, swelling and limitation in mobility. ?Denies seizures, numbness, or tingling. ? ? ? ?PE ? ?BP 113/66 (BP Location: Right Arm, Patient Position: Sitting, Cuff Size: Large)   Pulse 72   Ht 5\' 11"  (1.803 m)   Wt 160 lb (72.6 kg)   SpO2 98%   BMI 22.32 kg/m?  ? ?Patient alert and oriented and in no cardiopulmonary distress. ? ?HEENT: No facial asymmetry, EOMI,     Neck supple , has jewelry on left ear,  Missing teeth noted on examination, mouth is moist, no  ulcer or redness noted ? ?Chest: Clear to auscultation bilaterally. ? ?CVS: S1, S2 no murmurs, no S3.Regular rate. ? ?ABD: Soft non tender.  ? ?Ext: No edema ? ?MS: Adequate ROM spine, shoulders, hips and knees. ? ?Psych: Good eye contact, normal affect. Memory intact, pt is anxious and depressed appearing. ? ? ? ? ?Assessment & Plan ?Cluster headache ?Under control since after dental procedure. ?Patient states that he has been taking Tylenol 1000 mg as needed ?Patient told not to take more than 3000 mg of Tylenol in 24 hours to reduce risk of liver damage patient verbalized understanding. ?Could not get MRI done due to having earing on left ear, states that he does not have a follow-up appointment with neurology.  ? ?DOE (dyspnea on exertion) ?Has upcoming echocardiogram ?Patient encouraged to maintain close follow-up with cardiology he verbalized understanding ? ?Anxiety and depression ?Chronic condition previously taking clonazepam ?Patient denies SI, HI ?Patient referred for counseling and referral to psych ?Patient states that he will check with cardiology about which antianxiety medication is compatible with Coumadin and get back to me. ?GAD score21, PHQ-9 score 18 ? ?Elevated alkaline phosphatase level ?ALP 127, denies drinking alcohol ?Most likely due to taking Tylenol for HA ?Patient educated on the need to not take more than 3000 mg of Tylenol in 24 hours, he verbalized understanding  ?

## 2021-06-28 NOTE — Assessment & Plan Note (Signed)
Chronic condition previously taking clonazepam ?Patient denies SI, HI ?Patient referred for counseling and referral to psych ?Patient states that he will check with cardiology about which antianxiety medication is compatible with Coumadin and get back to me. ?GAD score21, PHQ-9 score 18 ?

## 2021-06-28 NOTE — Assessment & Plan Note (Signed)
Under control since after dental procedure. ?Patient states that he has been taking Tylenol 1000 mg as needed ?Patient told not to take more than 3000 mg of Tylenol in 24 hours to reduce risk of liver damage patient verbalized understanding. ?Could not get MRI done due to having earing on left ear, states that he does not have a follow-up appointment with neurology.  ?

## 2021-06-29 ENCOUNTER — Ambulatory Visit (INDEPENDENT_AMBULATORY_CARE_PROVIDER_SITE_OTHER): Payer: Medicaid Other

## 2021-06-29 DIAGNOSIS — Z952 Presence of prosthetic heart valve: Secondary | ICD-10-CM

## 2021-06-29 LAB — ECHOCARDIOGRAM COMPLETE
AR max vel: 2.03 cm2
AV Area VTI: 2.43 cm2
AV Area mean vel: 2.11 cm2
AV Mean grad: 17.2 mmHg
AV Peak grad: 33.7 mmHg
Ao pk vel: 2.9 m/s
Area-P 1/2: 2.4 cm2
Calc EF: 66.1 %
S' Lateral: 2.75 cm
Single Plane A2C EF: 65.6 %
Single Plane A4C EF: 65.8 %

## 2021-07-04 ENCOUNTER — Other Ambulatory Visit: Payer: Self-pay

## 2021-07-04 NOTE — Patient Outreach (Signed)
[facility] Customer service manager Scott [name]) Care Management ? ?07/04/2021 ? ?[name] ?[birth year] ?[id number] ? ?LCSW completed [name] outreach attempt today during scheduled appointment time but was unable to reach patient successfully during first outreach attempt. A HIPPA compliant voice message was unable to be left due to mailbox being full. LCSW will ask Scheduling Care Guide to reschedule [facility] SW appointment with patient as well. ? ?[name], BSW, MSW, LCSW ?Managed Medicaid LCSW ?[place]  [facility] ?[name] ?Phone: [phone] ? ? ? ? ?

## 2021-07-04 NOTE — Patient Instructions (Signed)
[name] ,  ? ?The [facility] Managed Care Team is available to provide assistance to you with your healthcare needs at no cost and as a benefit of your [facility] Health plan. I'm sorry I was unable to reach you today for our scheduled appointment. Our care guide will call you to reschedule our telephone appointment. Please call me at the number below. I am available to be of assistance to you regarding your healthcare needs. .  ? ?Thank you,  ? ?Eula Fried, BSW, MSW, LCSW ?Managed Medicaid LCSW ?[facility] ?[email or web address] ?Phone: [phone] ? ? ?

## 2021-07-04 NOTE — Progress Notes (Deleted)
? ?Cardiology Office Note ? ? ?Date:  07/04/2021  ? ?ID:  Scott [name], DOB [birth year], MRN [id number] ? ?PCP:  Renee Rival, FNP  ?Cardiologist:   Dorris Carnes, MD  ? ?Pt with AV disease PResents for follow up  ?  ?History of Present Illness: ?Scott [name] is a 46 y.o. 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 ?Seen in Jan 2022 Tobe Sos) with some L sided CP   Complained of some dyspnea   Metoprlol was cut back  HIgh Rec CT ordered   No signficant abnormalities     Pulmonary appt   ? ?The pt ran out of coumadin a couple days ago   Refill denied by [facility]     He presents today to [place] ? ?Pt says he has intermitt L sided CP   Comes and goes  Can feel chest vibrate at times when he breathes   No other chest pains  ? ?I saw the pt in Sept 2022 ? ? ? ? ?No outpatient medications have been marked as taking for the 07/05/21 encounter (Appointment) with [name], MD.  ? ? ? ?Allergies:   Cocoa  ? ?Past Medical History:  ?Diagnosis Date  ? Anxiety   ? Aortic stenosis   ? Murmur, cardiac   ? Pancreatitis, acute 01/28/2017  ? ? ?Past Surgical History:  ?Procedure Laterality Date  ? AORTIC VALVE REPAIR    ? when pt was 46 years old  ? CARDIAC SURGERY    ? FINGER SURGERY    ? MECHANICAL AORTIC VALVE REPLACEMENT    ? pt has card with mechanical valve replacemnt details  ? RIGHT/LEFT HEART CATH AND CORONARY ANGIOGRAPHY Bilateral 09/17/2018  ? Procedure: RIGHT/LEFT HEART CATH AND CORONARY ANGIOGRAPHY;  Surgeon: Isaias Cowman, MD;  Location: [facility];  Service: Cardiovascular;  Laterality: Bilateral;  ? ? ? ?Social History:  The patient  reports that he has been smoking cigarettes. He has a  45.00 pack-year smoking history. He has never used smokeless tobacco. He reports that he does not currently use alcohol. He reports [name] drug use. Drug: Marijuana.  ? ?Family History:  The patient's family history is not on file.  ? ? ?ROS:  Please see the history of present illness. All other systems are reviewed and  Negative to the above problem except as noted.  ? ? ?PHYSICAL EXAM: ?VS:  There were no vitals taken for this visit.  ?[id number] 46 yo  in no acute distress  ?HEENT: normal  ?Neck: no JVD, No bruits  ?Cardiac: RRR;   + click   No rubs  No LE edema  ?Respiratory:  clear to auscultation bilaterally, normal work of breathing ?GI: soft, nontender, nondistended, + BS  No hepatomegaly  ?MS: no deformity Moving all extremities   ?Skin: warm and dry, no rash ?Neuro:  Strength and sensation are intact ?Psych: euthymic mood, full affect ? ? ?EKG:  EKG is ordered today.  SR 70   RBBB   LAFB ? ? ?Lipid Panel ?   ?Component Value Date/Time  ? CHOL 132 06/23/2021 1055  ? TRIG 119 06/23/2021 1055  ? HDL 30 (L) 06/23/2021 1055  ? CHOLHDL 4.4  06/23/2021 1055  ? CHOLHDL 4.8 12/30/2020 1537  ? VLDL 14 12/30/2020 1537  ? [place] 80 06/23/2021 1055  ? ?  ? ?Wt Readings from Last 3 Encounters:  ?06/28/21 160 lb (72.6 kg)  ?05/31/21 165 lb (74.8 kg)  ?05/17/21 168 lb (76.2 kg)  ?  ? ? ?ASSESSMENT AND PLAN: ? ?1  Congenital heart dz   pt with congenital AS and coarctation of aorta   Now s/p 2 surgeries, last with AVR in 2020     OUt of coumadin for a couple days ?Will refiell    Make appt to establish in coumadin clinic  ?Get echo here so images available for review ? ?2  Chest pain    Pt with intermitt L sided pain   He points to region of anterior chest    Worse with deep breath.    No signif abnormaitlies on exam    ?Will set up for CXR  Consider pulmonary eval   ? ?3   Tobacco abuse   Pt continues to smoke   Counsellec on cessatoin   ? ?Plan for f/u in 6 months   ? ?[name] medicines are reviewed at length with the  patient today.  The patient does not have concerns regarding medicines. ? ?Signed, ?Dorris Carnes, MD  ?07/04/2021 9:45 PM    ?[facility] ?[address], [place], [place]  [address] ?Phone: [phone]; Fax: [phone]  ? ? ?

## 2021-07-05 ENCOUNTER — Ambulatory Visit (INDEPENDENT_AMBULATORY_CARE_PROVIDER_SITE_OTHER): Payer: Medicaid Other | Admitting: Internal Medicine

## 2021-07-05 ENCOUNTER — Encounter: Payer: Self-pay | Admitting: Internal Medicine

## 2021-07-05 ENCOUNTER — Other Ambulatory Visit: Payer: Self-pay

## 2021-07-05 ENCOUNTER — Ambulatory Visit: Payer: Medicaid Other | Admitting: Internal Medicine

## 2021-07-05 ENCOUNTER — Telehealth: Payer: Self-pay | Admitting: Nurse Practitioner

## 2021-07-05 VITALS — BP 108/60 | HR 70 | Ht 71.0 in | Wt 165.2 lb

## 2021-07-05 DIAGNOSIS — Z952 Presence of prosthetic heart valve: Secondary | ICD-10-CM | POA: Diagnosis not present

## 2021-07-05 MED ORDER — HYDROCHLOROTHIAZIDE 12.5 MG PO CAPS
12.5000 mg | ORAL_CAPSULE | Freq: Every day | ORAL | 3 refills | Status: DC
Start: 2021-07-05 — End: 2022-06-07

## 2021-07-05 NOTE — Telephone Encounter (Signed)
ERROR. Please disregard

## 2021-07-05 NOTE — Patient Instructions (Signed)
Medication Instructions:  ? ?Decrease HCTZ to 12.5 mg Daily  ? ?*If you need a refill on your cardiac medications before your next appointment, please call your pharmacy* ? ? ?Lab Work: ?NONE  ? ?If you have labs (blood work) drawn today and your tests are completely normal, you will receive your results only by: ?MyChart Message (if you have MyChart) OR ?A paper copy in the mail ?If you have any lab test that is abnormal or we need to change your treatment, we will call you to review the results. ? ? ?Testing/Procedures: ?NONE  ? ? ?Follow-Up: ?At [facility], you and your health needs are our priority.  As part of our continuing mission to provide you with exceptional heart care, we have created designated Provider Care Teams.  These Care Teams include your primary Cardiologist (physician) and Advanced Practice Providers (APPs -  Physician Assistants and Nurse Practitioners) who all work together to provide you with the care you need, when you need it. ? ?We recommend signing up for the patient portal called "MyChart".  Sign up information is provided on this After Visit Summary.  MyChart is used to connect with patients for Virtual Visits (Telemedicine).  Patients are able to view lab/test results, encounter notes, upcoming appointments, etc.  Non-urgent messages can be sent to your provider as well.   ?To learn more about what you can do with MyChart, go to [email or web address].   ? ?Your next appointment:   ? January / February  ? ?The format for your next appointment:   ?In Person ? ?Provider:   ?Dietrich Pates, MD  ? ? ?Other Instructions ?Thank you for choosing [facility]! ?  ? ? ?

## 2021-07-05 NOTE — Progress Notes (Signed)
? ?Cardiology Office Note ? ? ?Date:  07/05/2021  ? ?ID:  Scott [name], DOB 12/24/1975, MRN [id number] ? ?PCP:  Donell Beers, FNP  ?Cardiologist:   Dietrich Pates, MD  ? ?Pt with AV disease PResents for follow up  ?  ?History of Present Illness: ?Scott [name] is a 46 y.o. male with a history of coarctation of the aorta (s/p repair), L subclavian stenosis, congenital AS(s/p Merrilee Jansky prcedure at age 77 and St Jude AVR (10/2018), chest pain, PAF(post AVR surgery; on amiodarone), hydrothorax (postop),  COPD   Previously followed by Drs Fransico Michael ([facility]) and [facility] Note cath in 2020 presurgery showed no sigificant CAD   ? ?Seen in Jan 2022 Fransico Michael) with some L sided CP   Complained of some dyspnea   Metoprlol was cut back  HIgh Rec CT ordered   No signficant abnormalities     Pulmonary appt   ? ?I saw the pt in Sept 2022   He had run out of coumadin  Complained of intermitt CP    ? ?Since seen the pt says gets "gassed" whenever he tries to do things  SOB   He denies CP   No dizziness   No palpitations  ? ?  Back on coumadin    Needs to have dental work done  ? ? ? ?Current Meds  ?Medication Sig  ? acetaminophen (TYLENOL) 325 MG tablet Take 650 mg by mouth every 6 (six) hours as needed.  ? albuterol (VENTOLIN HFA) 108 (90 Base) MCG/ACT inhaler Inhale into the lungs.  ? cetirizine (ZYRTEC) 10 MG tablet Take 10 mg by mouth daily.  ? cyclobenzaprine (FLEXERIL) 10 MG tablet Take 1 tablet (10 mg total) by mouth 3 (three) times daily as needed for muscle spasms.  ? hydrochlorothiazide (HYDRODIURIL) 25 MG tablet Take 25 mg by mouth daily.  ? metoprolol succinate (TOPROL-XL) 25 MG 24 hr tablet Take 25 mg by mouth daily.  ? rosuvastatin (CRESTOR) 10 MG tablet Take 1 tablet (10 mg total) by mouth daily.  ? warfarin (COUMADIN) 10 MG tablet Take 1 1/2 tablets by mouth daily or as directed by the coumadin clinic  ? ? ? ?Allergies:   Cocoa  ? ?Past Medical History:  ?Diagnosis Date  ? Anxiety   ? Aortic stenosis   ? Murmur, cardiac    ? Pancreatitis, acute 01/28/2017  ? ? ?Past Surgical History:  ?Procedure Laterality Date  ? AORTIC VALVE REPAIR    ? when pt was 46 years old  ? CARDIAC SURGERY    ? FINGER SURGERY    ? MECHANICAL AORTIC VALVE REPLACEMENT    ? pt has card with mechanical valve replacemnt details  ? RIGHT/LEFT HEART CATH AND CORONARY ANGIOGRAPHY Bilateral 09/17/2018  ? Procedure: RIGHT/LEFT HEART CATH AND CORONARY ANGIOGRAPHY;  Surgeon: Marcina Millard, MD;  Location: [facility];  Service: Cardiovascular;  Laterality: Bilateral;  ? ? ? ?Social History:  The patient  reports that he has been smoking cigarettes. He has a 45.00 pack-year smoking history. He has never used smokeless tobacco. He reports that he does not currently use alcohol. He reports current drug use. Drug: Marijuana.  ? ?Family History:  The patient's family history is not on file.  ? ? ?ROS:  Please see the history of present illness. All other systems are reviewed and  Negative to the above problem except as noted.  ? ? ?PHYSICAL EXAM: ?VS:  BP 108/60   Pulse 70   Ht 5\' 11"  (1.803  m)   Wt 165 lb 3.2 oz (74.9 kg)   SpO2 96%   BMI 23.04 kg/m?   ?ELF:YBOF 46 yo  in no acute distress  ?HEENT: normal  ?Neck: no JVD, No bruits  ?Cardiac: RRR;   Crisp valve sounds     No LE edema  ?Respiratory:  clear to auscultation bilaterally  ?GI: soft, nontender, nondistended, + BS  No hepatomegaly  ?MS: no deformity Moving all extremities   ?Skin: warm and dry, no rash ?Neuro:  Strength and sensation are intact ?Psych: euthymic mood, full affect ? ? ?EKG:  EKG is not  ordered today.   ? ?Echo   06/28/21 ? ?eft ventricular ejection fraction, by estimation, is 65 to 70%. The left ventricle has ?normal function. The left ventricle has no regional wall motion abnormalities. There is ?mild left ventricular hypertrophy. Left ventricular diastolic parameters were normal. ?1. ?Right ventricular systolic function is normal. The right ventricular size is normal. ?There is  normal pulmonary artery systolic pressure. The estimated right ventricular ?systolic pressure is 21.3 mmHg. ?2. ?3. The mitral valve is grossly normal. Trivial mitral valve regurgitation. ?The aortic valve has been repaired/replaced. Aortic valve regurgitation is trivial. There ?is a 21 mm St. Jude mechanical valve present in the aortic position. Procedure Date: ?11/03/18. Aortic valve mean gradient measures 17.2 mmHg. ?4. ?5. Aortic arch not well visualized. ?The inferior vena cava is normal in size with greater than 50% respiratory variability, ?suggesting right atrial pressure of 3 mmHg. ?6. ?Comparison(s): Prior images unable to be directly viewed. ?Lipid Panel ?   ?Component Value Date/Time  ? CHOL 132 06/23/2021 1055  ? TRIG 119 06/23/2021 1055  ? HDL 30 (L) 06/23/2021 1055  ? CHOLHDL 4.4 06/23/2021 1055  ? CHOLHDL 4.8 12/30/2020 1537  ? VLDL 14 12/30/2020 1537  ? LDLCALC 80 06/23/2021 1055  ? ?  ? ?Wt Readings from Last 3 Encounters:  ?07/05/21 165 lb 3.2 oz (74.9 kg)  ?06/28/21 160 lb (72.6 kg)  ?05/31/21 165 lb (74.8 kg)  ?  ? ? ?ASSESSMENT AND PLAN: ? ?1  Congenital heart dz   pt with congenital AS and coarctation of aorta   Now s/p 2 surgeries, last with AVR in 2020    ?He is back on coumadin     ?Echo just done   Mean gradient through the valve is 17   follow     ? ? ?2  Chest pain    Atypical in past   ? ?3  Dyspnea    Occasional lightheadness   Will cut back on HCTZ to 1/2   Follow   May stop   Pt will write in with how feels    ? ?3  LIpids    On Crestor now    LDL 80   Trig 199 ?Discussed diet     Stay on this  ? ?4     Tobacco abuse   Pt continues to smoke   Counsellec again on cessation   Smokes about 1ppd    ? ?Plan for f/u in 6 months   ? ?Current medicines are reviewed at length with the patient today.  The patient does not have concerns regarding medicines. ? ?Signed, ?Dietrich Pates, MD  ?07/05/2021 2:21 PM    ?[facility] ?[address] [place], [place], [place]  [address] ?Phone:  [phone]; Fax: [phone]  ? ? ?

## 2021-07-06 ENCOUNTER — Other Ambulatory Visit: Payer: Self-pay | Admitting: Licensed Clinical Social Worker

## 2021-07-06 DIAGNOSIS — Z952 Presence of prosthetic heart valve: Secondary | ICD-10-CM

## 2021-07-06 DIAGNOSIS — F32A Depression, unspecified: Secondary | ICD-10-CM

## 2021-07-06 DIAGNOSIS — Z599 Problem related to housing and economic circumstances, unspecified: Secondary | ICD-10-CM

## 2021-07-06 DIAGNOSIS — I10 Essential (primary) hypertension: Secondary | ICD-10-CM

## 2021-07-06 DIAGNOSIS — Z7901 Long term (current) use of anticoagulants: Secondary | ICD-10-CM

## 2021-07-06 DIAGNOSIS — R0602 Shortness of breath: Secondary | ICD-10-CM

## 2021-07-06 NOTE — Patient Outreach (Addendum)
?Medicaid Managed Care ?Social Work Note ? ?07/06/2021 ?Name:  Scott [name] MRN:  [id number] DOB:  [birth year] ? ?Scott [name] is an 46 y.o. year old male who is a primary patient of Donell Beers, FNP.  The [facility] Managed Care Coordination team was consulted for assistance with:  Mental Health Counseling and Resources ? ?Scott [name] was given information about Medicaid Managed Care Coordination team services today. Scott [name] Patient agreed to services and verbal consent obtained. ? ?Engaged with patient  for by telephone forinitial visit in response to referral for case management and/or care coordination services.  ? ?Assessments/Interventions:  Review of past medical history, allergies, medications, health status, including review of consultants reports, laboratory and other test data, was performed as part of comprehensive evaluation and provision of chronic care management services. ? ?SDOH: (Social Determinant of Health) assessments and interventions performed: ?SDOH Interventions   ? ?Flowsheet Row Most Recent Value  ?SDOH Interventions   ?Depression Interventions/Treatment  Referral to Psychiatry, Counseling  ? ?  ? ? ?Advanced Directives Status:  See Care Plan for related entries. ? ?Care Plan ?                ?Allergies  ?Allergen Reactions  ? Cocoa Hives  ?  Pt sts he is allergic to Coco Puffs  ? ? ?Medications Reviewed Today   ? ? Reviewed by Delsa Sale, CMA (Certified Medical Assistant) on 07/05/21 at 1417  Med List Status: <None>  ? ?Medication Order Taking? Sig Documenting Provider Last Dose Status Informant  ?acetaminophen (TYLENOL) 325 MG tablet [id number] Yes Take 650 mg by mouth every 6 (six) hours as needed. [provider] Taking Active   ?albuterol (VENTOLIN HFA) 108 (90 Base) MCG/ACT inhaler [id number] Yes Inhale into the lungs. [provider] Taking Active   ?         ?Med Note Harriet Pho   Wed Jun 28, 2021 11:17 AM) As needed  ?cetirizine (ZYRTEC)  10 MG tablet [id number] Yes Take 10 mg by mouth daily. [provider] Taking Active   ?cyclobenzaprine (FLEXERIL) 10 MG tablet [id number] Yes Take 1 tablet (10 mg total) by mouth 3 (three) times daily as needed for muscle spasms. [name], MD Taking Active   ?hydrochlorothiazide (HYDRODIURIL) 25 MG tablet [id number] Yes Take 25 mg by mouth daily. [provider] Taking Active   ?methocarbamol (ROBAXIN) 750 MG tablet [id number]  Take 1 tablet (750 mg total) by mouth 3 (three) times daily as needed (muscle spasm/pain). Cathren Laine, MD  Active   ?         ?Med Note Harriet Pho   Wed Jun 28, 2021 11:17 AM) As needed  ?metoprolol succinate (TOPROL-XL) 25 MG 24 hr tablet [id number] Yes Take 25 mg by mouth daily. [provider] Taking Active   ?rosuvastatin (CRESTOR) 10 MG tablet [id number] Yes Take 1 tablet (10 mg total) by mouth daily. [name], MD Taking Active   ?warfarin (COUMADIN) 10 MG tablet [id number] Yes Take 1 1/2 tablets by mouth daily or as directed by the coumadin clinic [name], MD Taking Active   ? ?  ?  ? ?  ? ? ?Patient Active Problem List  ? Diagnosis Date Noted  ? Anxiety and depression 06/28/2021  ? Elevated alkaline phosphatase level 06/28/2021  ? Cluster headache 03/28/2021  ? Acute midline low back pain 03/28/2021  ? Encounter for support and coordination of transition of care 03/28/2021  ? DOE (dyspnea on exertion)  01/11/2021  ? Chest pain of unknown etiology 01/11/2021  ? Encounter for general adult medical examination with abnormal findings 10/04/2020  ? Screening due 10/04/2020  ? S/P aortic valve replacement 10/04/2020  ? Sinusitis, chronic 10/04/2020  ? Marijuana user 01/28/2017  ? ? ?Conditions to be addressed/monitored per PCP order:  Anxiety and Depression ? ?Care Plan : LCSW plan of care  ?Updates made by Gustavus BryantJoyce, Bryton Waight L, LCSW since 07/06/2021 12:00 AM  ?  ? ?Problem: Anxiety Identification (Anxiety)   ?  ? ?Long-Range Goal: Anxiety Symptoms  Identified   ?Start Date: 07/06/2021  ?Priority: High  ?Note:   ?Priority: High ? ?Timeframe:  Long-Range Goal ?Priority:  High ?Start Date:   07/05/21                ?Expected End Date:  ongoing                   ?  ?Follow Up Date--08/02/21 ? ?- check out counseling and psychiatry ?- keep 90 percent of counseling and psychiatry appointments ? ?Why is this important?   ?          Beating depression may take some time.  ?          If you don't feel better right away, don't give up on your treatment plan.  ?  ?Current barriers:   ?          Chronic Mental Health needs related to anxiety, depression and issues with adjusting to his multiple health concerns ?          Mental Health Concerns  ?          Needs Support, Education, and Care Coordination in order to meet unmet mental health needs. ?Clinical Goal(s): demonstrate a reduction in symptoms related to : anxiety, connect with provider for ongoing mental health treatment.  , and increase coping skills, healthy habits, self-management skills, and stress reduction     ?Clinical Interventions:  ?          Assessed patient's previous and current treatment, coping skills, support system and barriers to care  ??         Depression screen reviewed  ??         Solution-Focused Strategies ??         Mindfulness or Relaxation Training ??         Active listening / Reflection utilized  ??         Emotional Supportive Provided ??         Behavioral Activation ??         Participation in counseling encouraged  ??         Verbalization of feelings encouraged  ??         Crisis Resource Education / information provided  ??         Suicidal Ideation/Homicidal Ideation assessed: No SI/HI ??         Discussed Health Care Power of Attorney  ??         Discussed referral for counseling and psychiatry ?          Reviewed various resources, discussed options on available treatment options. Patient [facility] and [facility]. He has stable transportation  to [facility]. Referral made on 07/06/21. Referrals made to [facility] and Care Guides as well. Patient wishes to increase his overall support network. He has several financial and health  related issues that affect his depression and anxiety. [facility] LCSW educated patient on the power of thinking positive.  ??         Options for mental health treatment based on need and insurance ?          Inter-disciplinary care team collaboration (see longitudinal plan of care) ?          LCSW discussed coping skills for anxiety and depression. SW used empathetic and active and reflective listening, validated feelings/concerns, and provided emotional support. LCSW provided self-care education to help manage his mental health conditions and improve his mood. Patient was encouraged to contact [facility] LCSW if he has any urgent needs. [facility] LCSW sent email to patient with resources.  ?          Patient denies any current crises or urgent needs ? ?Patient Goals/Self-Care Activities: Over the next 120 days ?          Call your insurance provider for more information about your Enhanced Benefits  ?   ? ?  07/06/2021  ? 12:38 PM 06/28/2021  ? 11:22 AM 05/17/2021  ? 11:55 AM 05/04/2021  ? 11:36 AM 03/28/2021  ?  1:12 PM  ?Depression screen PHQ 2/9  ?Decreased Interest 3 2 0 0 0  ?Down, Depressed, Hopeless 2 2 0 0 0  ?PHQ - 2 Score 5 4 0 0 0  ?Altered sleeping 2 2     ?Tired, decreased energy 2 2     ?Change in appetite 2 2     ?Feeling bad or failure about yourself  3 3     ?Trouble concentrating 3 3     ?Moving slowly or fidgety/restless 0 0     ?Suicidal thoughts 1 2     ?PHQ-9 Score 18 18     ?Difficult doing work/chores Extremely dIfficult Extremely dIfficult     ? ?  ? ? ?Follow up:  Patient agrees to Care Plan and Follow-up. ? ?Plan: The Managed Medicaid care management team will reach out to the patient again over the next 30 days. ? ?Date/time of next scheduled Social Work care management/care coordination outreach:  08/02/21 at 12:45  pm ? ?[name], BSW, MSW, LCSW ?Managed Medicaid LCSW ?[place]  [facility] ?[email or web address] ?Phone: [phone] ? ? ?

## 2021-07-06 NOTE — Patient Instructions (Addendum)
Visit Information ? ?Mr. Fredin was given information about Medicaid Managed Care team care coordination services as a part of their [facility] Community Plan Medicaid benefit. Ennio Houp verbally consented to engagement with the [facility] Managed Care team.  ? ?If you are experiencing a medical emergency, please call 911 or report to your local emergency department or urgent care.  ? ?If you have a non-emergency medical problem during routine business hours, please contact your provider's office and ask to speak with a nurse.  ? ?For questions related to your [facility], please call: [phone] or visit the homepage here: [email or web address] ? ?If you would like to schedule transportation through your [facility], please call the following number at least 2 days in advance of your appointment: [phone]. ? Rides for urgent appointments can also be made after hours by calling Member Services. ? ?Call the Behavioral Health Crisis Line at [phone], at any time, 24 hours a day, 7 days a week. If you are in danger or need immediate medical attention call 911. ? ?If you would like help to quit smoking, call 1-800-QUIT-NOW (281-448-5928) OR Espa?ol: 1-855-D?jelo-Ya (806) 498-5391) o para m?s informaci?n haga clic aqu? or Text READY to 200-400 to register via text ? ?Following is a copy of your plan of care:  ?Care Plan : LCSW plan of care  ?Updates made by Gustavus Bryant, LCSW since 07/06/2021 12:00 AM  ?  ? ?Problem: Anxiety Identification (Anxiety)   ?  ? ?Long-Range Goal: Anxiety Symptoms Identified   ?Start Date: 07/06/2021  ?Priority: High  ?Note:   ?Priority: High ? ?Timeframe:  Long-Range Goal ?Priority:  High ?Start Date:   07/05/21                ?Expected End Date:  ongoing                   ?  ?Follow Up Date--08/02/21 at 12:45 pm ? ?- check out counseling and psychiatry ?- keep 90 percent  of counseling and psychiatry appointments ? ?Why is this important?   ?          Beating depression may take some time.  ?          If you don't feel better right away, don't give up on your treatment plan.  ?  ?Current barriers:   ?          Chronic Mental Health needs related to anxiety, depression and issues with adjusting to his multiple health concerns ?          Mental Health Concerns  ?          Needs Support, Education, and Care Coordination in order to meet unmet mental health needs. ?Clinical Goal(s): demonstrate a reduction in symptoms related to : anxiety, connect with provider for ongoing mental health treatment.  , and increase coping skills, healthy habits, self-management skills, and stress reduction     ? ?[name], BSW, MSW, LCSW ?Managed Medicaid LCSW ?[place]  [facility] ?[email or web address] ?Phone: [phone] ? ?  ?  ?

## 2021-07-18 ENCOUNTER — Other Ambulatory Visit: Payer: Self-pay | Admitting: *Deleted

## 2021-07-18 NOTE — Patient Outreach (Signed)
Care Coordination ? ?07/18/2021 ? ?[name] ?1975-05-31 ?[id number] ? ? ?Medicaid Managed Care  ? ?Unsuccessful Outreach Note ? ?07/18/2021 ?Name: Kiree Dejarnette MRN: [id number] DOB: 06-02-1975 ? ?Referred by: Donell Beers, FNP ?Reason for referral : High Risk Managed Medicaid (Unsuccessful RNCM initial outreach) ? ? ?An unsuccessful telephone outreach was attempted today. The patient was referred to the case management team for assistance with care management and care coordination.  ? ?Follow Up Plan: The care management team will reach out to the patient again over the next 14 days.  ? ?Estanislado Emms RN, BSN ?[place]  [facility] ?RN Care Coordinator ? ? ?

## 2021-07-18 NOTE — Patient Instructions (Signed)
Visit Information ? ?Scott [name]  - as a part of your Medicaid benefit, you are eligible for care management and care coordination services at no cost or copay. I was unable to reach you by phone today but would be happy to help you with your health related needs. Please feel free to call me @ [phone].  ? ?A member of the Managed Medicaid care management team will reach out to you again over the next 14 days.  ? ?Issis Lindseth RN, BSN ?[place]  [facility] ?RN Care Coordinator ?  ?

## 2021-07-19 ENCOUNTER — Telehealth: Payer: Self-pay | Admitting: Nurse Practitioner

## 2021-07-19 NOTE — Telephone Encounter (Signed)
.. ?  Medicaid Managed Care  ? ?Unsuccessful Outreach Note ? ?07/19/2021 ?Name: Scott [name] MRN: [id number] DOB: [birth year] ? ?Referred by: Donell Beers, FNP ?Reason for referral : High Risk Managed Medicaid (I called the patient today to get him rescheduled with the MM RNCM. I left my name and number on his VM.) ? ? ?An unsuccessful telephone outreach was attempted today. The patient was referred to the case management team for assistance with care management and care coordination.  ? ?Follow Up Plan: The care management team will reach out to the patient again over the next 7 days.  ? ?Weston Settle ?Care Guide, High Risk Medicaid Managed Care ?Embedded Care Coordination ?[place]  [facility]  ? ? ? ?

## 2021-07-23 DIAGNOSIS — R109 Unspecified abdominal pain: Secondary | ICD-10-CM | POA: Diagnosis not present

## 2021-07-23 DIAGNOSIS — N23 Unspecified renal colic: Secondary | ICD-10-CM | POA: Diagnosis not present

## 2021-07-23 DIAGNOSIS — N201 Calculus of ureter: Secondary | ICD-10-CM | POA: Diagnosis not present

## 2021-07-23 DIAGNOSIS — N1330 Unspecified hydronephrosis: Secondary | ICD-10-CM | POA: Diagnosis not present

## 2021-07-24 ENCOUNTER — Telehealth: Payer: Self-pay

## 2021-07-24 NOTE — Telephone Encounter (Signed)
Transition Care Management Follow-up Telephone Call ?Date of discharge and from where: 07/23/2021-[facility]  ?How have you been since you were released from the hospital? Pt stated he is doing fine.  ?Any questions or concerns? No ? ?Items Reviewed: ?Did the pt receive and understand the discharge instructions provided? Yes  ?Medications obtained and verified? Yes  ?Other? No  ?Any new allergies since your discharge? No  ?Dietary orders reviewed? No ?Do you have support at home? Yes  ? ?Home Care and Equipment/Supplies: ?Were home health services ordered? not applicable ?If so, what is the name of the agency? N/A  ?Has the agency set up a time to come to the patient's home? not applicable ?Were any new equipment or medical supplies ordered?  No ?What is the name of the medical supply agency? N/A ?Were you able to get the supplies/equipment? not applicable ?Do you have any questions related to the use of the equipment or supplies? No ? ?Functional Questionnaire: (I = Independent and D = Dependent) ?ADLs: I ? ?Bathing/Dressing- I ? ?Meal Prep- I ? ?Eating- I ? ?Maintaining continence- I ? ?Transferring/Ambulation- I ? ?Managing Meds- I ? ?Follow up appointments reviewed: ? ?PCP Hospital f/u appt confirmed? No   ?Specialist Hospital f/u appt confirmed? No   ?Are transportation arrangements needed? No  ?If their condition worsens, is the pt aware to call PCP or go to the Emergency Dept.? Yes ?Was the patient provided with contact information for the PCP's office or ED? Yes ?Was to pt encouraged to call back with questions or concerns? Yes  ?

## 2021-07-25 ENCOUNTER — Emergency Department (HOSPITAL_COMMUNITY): Payer: Medicaid Other

## 2021-07-25 ENCOUNTER — Ambulatory Visit (INDEPENDENT_AMBULATORY_CARE_PROVIDER_SITE_OTHER): Payer: Medicaid Other | Admitting: Nurse Practitioner

## 2021-07-25 ENCOUNTER — Encounter (HOSPITAL_COMMUNITY): Payer: Self-pay | Admitting: *Deleted

## 2021-07-25 ENCOUNTER — Encounter: Payer: Self-pay | Admitting: Nurse Practitioner

## 2021-07-25 ENCOUNTER — Emergency Department (HOSPITAL_COMMUNITY)
Admission: EM | Admit: 2021-07-25 | Discharge: 2021-07-25 | Disposition: A | Discharge status: Home / Self Care | Payer: Medicaid Other | Attending: Emergency Medicine | Admitting: Emergency Medicine

## 2021-07-25 VITALS — BP 138/86 | HR 64 | Ht 71.0 in | Wt 165.0 lb

## 2021-07-25 DIAGNOSIS — N201 Calculus of ureter: Secondary | ICD-10-CM | POA: Diagnosis not present

## 2021-07-25 DIAGNOSIS — N132 Hydronephrosis with renal and ureteral calculous obstruction: Secondary | ICD-10-CM | POA: Insufficient documentation

## 2021-07-25 DIAGNOSIS — R112 Nausea with vomiting, unspecified: Secondary | ICD-10-CM | POA: Diagnosis not present

## 2021-07-25 DIAGNOSIS — Z7689 Persons encountering health services in other specified circumstances: Secondary | ICD-10-CM

## 2021-07-25 DIAGNOSIS — R109 Unspecified abdominal pain: Secondary | ICD-10-CM | POA: Diagnosis present

## 2021-07-25 DIAGNOSIS — Z7901 Long term (current) use of anticoagulants: Secondary | ICD-10-CM | POA: Diagnosis not present

## 2021-07-25 LAB — CBC WITH DIFFERENTIAL/PLATELET
Abs Immature Granulocytes: 0.05 10*3/uL (ref 0.00–0.07)
Basophils Absolute: 0.1 10*3/uL (ref 0.0–0.1)
Basophils Relative: 1 %
Eosinophils Absolute: 0.1 10*3/uL (ref 0.0–0.5)
Eosinophils Relative: 1 %
HCT: 40.8 % (ref 39.0–52.0)
Hemoglobin: 13.7 g/dL (ref 13.0–17.0)
Immature Granulocytes: 0 %
Lymphocytes Relative: 11 %
Lymphs Abs: 1.6 10*3/uL (ref 0.7–4.0)
MCH: 31.6 pg (ref 26.0–34.0)
MCHC: 33.6 g/dL (ref 30.0–36.0)
MCV: 94.2 fL (ref 80.0–100.0)
Monocytes Absolute: 1.1 10*3/uL — ABNORMAL HIGH (ref 0.1–1.0)
Monocytes Relative: 7 %
Neutro Abs: 12.3 10*3/uL — ABNORMAL HIGH (ref 1.7–7.7)
Neutrophils Relative %: 80 %
Platelets: 270 10*3/uL (ref 150–400)
RBC: 4.33 MIL/uL (ref 4.22–5.81)
RDW: 13.2 % (ref 11.5–15.5)
WBC: 15.2 10*3/uL — ABNORMAL HIGH (ref 4.0–10.5)
nRBC: 0 % (ref 0.0–0.2)

## 2021-07-25 LAB — URINALYSIS, ROUTINE W REFLEX MICROSCOPIC
Bacteria, UA: NONE SEEN
Bilirubin Urine: NEGATIVE
Glucose, UA: NEGATIVE mg/dL
Ketones, ur: 5 mg/dL — AB
Leukocytes,Ua: NEGATIVE
Nitrite: NEGATIVE
Protein, ur: 30 mg/dL — AB
RBC / HPF: >50 RBC/hpf — ABNORMAL HIGH (ref 0–5)
Specific Gravity, Urine: 1.024 (ref 1.005–1.030)
pH: 5 (ref 5.0–8.0)

## 2021-07-25 LAB — BASIC METABOLIC PANEL
Anion gap: 8 (ref 5–15)
BUN: 17 mg/dL (ref 6–20)
CO2: 23 mmol/L (ref 22–32)
Calcium: 9.1 mg/dL (ref 8.9–10.3)
Chloride: 106 mmol/L (ref 98–111)
Creatinine, Ser: 1.1 mg/dL (ref 0.61–1.24)
GFR, Estimated: >60 mL/min (ref >60)
Glucose, Bld: 130 mg/dL — ABNORMAL HIGH (ref 70–99)
Potassium: 3.7 mmol/L (ref 3.5–5.1)
Sodium: 137 mmol/L (ref 135–145)

## 2021-07-25 MED ORDER — HYDROMORPHONE HCL 1 MG/ML IJ SOLN
1.0000 mg | Freq: Once | INTRAMUSCULAR | Status: AC
  Administered 2021-07-25: 1 mg via INTRAVENOUS
  Filled 2021-07-25: qty 1

## 2021-07-25 MED ORDER — KETOROLAC TROMETHAMINE 30 MG/ML IJ SOLN
15.0000 mg | Freq: Once | INTRAMUSCULAR | Status: AC
  Administered 2021-07-25: 15 mg via INTRAMUSCULAR
  Filled 2021-07-25: qty 1

## 2021-07-25 MED ORDER — SODIUM CHLORIDE 0.9 % IV BOLUS
1000.0000 mL | Freq: Once | INTRAVENOUS | Status: AC
  Administered 2021-07-25: 1000 mL via INTRAVENOUS

## 2021-07-25 MED ORDER — OXYCODONE-ACETAMINOPHEN 5-325 MG PO TABS: 1.0000 | ORAL_TABLET | Freq: Three times a day (TID) | ORAL | 0 refills | Status: AC | PRN

## 2021-07-25 NOTE — Assessment & Plan Note (Signed)
follow up for ED visit on 07/23/2021 for urethral lithiasis.  Patient was discharged with pain medication, Zofran, Flomax and strainer and asked to follow-up with PCP or urology patient stated that has not passed any stone yet, has constant flank pain, Norco not helping his pain zofran not helping nausea and vomiting .  He has been taking Flomax as ordered ?Labs  and imaging done at the ER results reviewed by me today.  ?Obstructing 3 mm stone within the distal RIGHT ureter causing moderate RIGHT-sided hydronephrosis noted on CT. ? ?

## 2021-07-25 NOTE — Discharge Instructions (Signed)
You have a kidney stone, likely the cause of your pain is 3 mm at the end of your ureter and should pass on its own.  Please continue with the Flomax that was provided to you.  also given you additional pain medication please not take this with the other pain provided you from [facility]  as it can cause you to be very sleepy.  Please remember to stay hydrated. ? ?Please follow-up with urology for further evaluation ? ?If you starthaving worsening pain unable to urinate fevers or chills you must come back in for reevaluation ? ?Come back to the emergency department if you develop chest pain, shortness of breath, severe abdominal pain, uncontrolled nausea, vomiting, diarrhea. ? ?

## 2021-07-25 NOTE — ED Triage Notes (Signed)
Right flank pain, states he has been diagnosed with kidney stone, saw PCP and was told to come to the ED. Still has nausea and vomiting, needs pain control ?

## 2021-07-25 NOTE — Assessment & Plan Note (Signed)
Uncontrolled condition Due to kidney stones ?Taking Zofran 4 mg tablet as needed.  ?Patient refused Zofran injection at the office today, referred to the ER. ?Adequate hydration encouraged.  ? ?

## 2021-07-25 NOTE — Progress Notes (Signed)
? ?  Scott [name]     MRN: [id number]      DOB: 02/13/1976 ? ? ?HPI ?Mr. [name] with medical history of cluster headache, aortic valve replacement, anxiety and depression, is here for follow up for ED visit on 07/23/2021 for urethral lithiasis.  Patient was discharged with pain medication, Zofran, Flomax and strainer and asked to follow-up with PCP or urology patient stated that has not passed any stone yet, has constant flank pain, states that he is still passing blood in his urine, Norco not helping his pain zofran not helping nausea and vomiting .  He has been taking Flomax as ordered.  Patient denies fever, chills, chest pain shortness of breath ? ? ?ROS ?Denies recent fever or chills. ?Denies sinus pressure, nasal congestion, ear pain or sore throat. ?Denies chest congestion, productive cough or wheezing. ?Denies chest pains, palpitations and leg swelling ?Denies diarrhea or constipation.   ?Denies dysuria, frequency, hesitancy or incontinence. ?Denies depression, anxiety or insomnia. ? ? ?PE ? ?BP 138/86 (BP Location: Right Arm, Patient Position: Sitting, Cuff Size: Large)   Pulse 64   Ht 5\' 11"  (1.803 m)   Wt 165 lb (74.8 kg)   SpO2 98%   BMI 23.01 kg/m?  ? ?Patient alert and oriented and in no cardiopulmonary distress. ? ?Chest: Clear to auscultation bilaterally. ? ?CVS: S1, S2 no murmurs, no S3.Regular rate. ? ?ABD: has Abdominal tenderness with with right CVA tenderness.  ? ?MS: Adequate ROM spine, shoulders, hips and knees. ? ?Psych: Good eye contact, normal affect. Memory intact not anxious or depressed appearing. ? ? ? ?Assessment & Plan ?Ureterolithiasis ?Pain uncontrolled with Norco/vicodin 5-325mg  tablets ?Has started taking Flomax 0.4 mg capsule daily. ?Has not passed stone ?Urgent referral placed to your urology. ?Patient sent to the ED for uncontrolled pain.  ? ?Nausea & vomiting ?Uncontrolled condition Due to kidney stones ?Taking Zofran 4 mg tablet as needed.  ?Patient refused Zofran  injection at the office today, referred to the ER. ?Adequate hydration encouraged.  ? ? ?Encounter for support and coordination of transition of care ? follow up for ED visit on 07/23/2021 for urethral lithiasis.  Patient was discharged with pain medication, Zofran, Flomax and strainer and asked to follow-up with PCP or urology patient stated that has not passed any stone yet, has constant flank pain, Norco not helping his pain zofran not helping nausea and vomiting .  He has been taking Flomax as ordered ?Labs  and imaging done at the ER results reviewed by me today.  ?Obstructing 3 mm stone within the distal RIGHT ureter causing moderate RIGHT-sided hydronephrosis noted on CT. ?  ? ?

## 2021-07-25 NOTE — Patient Instructions (Signed)
Please go to the ER as discussed.  ? ?I have put in an urgent referral to urology.  ?

## 2021-07-25 NOTE — ED Notes (Signed)
Patient transported to CT 

## 2021-07-25 NOTE — Assessment & Plan Note (Signed)
Pain uncontrolled with Norco/vicodin 5-325mg  tablets ?Has started taking Flomax 0.4 mg capsule daily. ?Has not passed stone ?Urgent referral placed to your urology. ?Patient sent to the ED for uncontrolled pain.  ?

## 2021-07-25 NOTE — ED Provider Notes (Signed)
?[facility] EMERGENC[facility] DEPARTMENT ?Provider Note ? ? ?CSN: [id number] ?Arrival date & time: 07/25/21  1153 ? ?  ? ?History ? ?Chief Complaint  ?Patient presents with  ? Flank Pain  ? ? ?Scott [name] is a 46 y.o. male. ? ?HPI ? ?Patient without significant medical history presents with complaints of worsening right-sided flank tenderness.  Patient states that he was seen at [facility] 2 days ago and was diagnosed with a kidney stone, states he been taking narcotic medication but is still having severe pain.  States pain is mainly in the right flank will rating down to his right groin, states he has increased urge to urinate but has difficulty getting it out.  Denies any dysuria hematuria endorses nausea and vomiting but denies any fevers or chills no suprapubic tenderness.  Patient states he has had occasions in the past to be going on he does not seen a urologist. ? ?I reviewed patient's chart was seen in [facility] CT imaging reveals he has a 3 mm stone at the distal ureter he was started on Flomax as well as Vicodin. ? ?Home Medications ?Prior to Admission medications   ?Medication Sig Start Date End Date Taking? Authorizing Provider  ?oxyCODONE-acetaminophen (PERCOCET/ROXICET) 5-325 MG tablet Take 1 tablet by mouth every 8 (eight) hours as needed for up to 4 days for severe pain. 07/25/21 07/29/21 Yes Carroll Sage, PA-C  ?acetaminophen (TYLENOL) 325 MG tablet Take 650 mg by mouth every 6 (six) hours as needed.    [provider]  ?albuterol (VENTOLIN HFA) 108 (90 Base) MCG/ACT inhaler Inhale into the lungs. 12/31/19   [provider]  ?cetirizine (ZYRTEC) 10 MG tablet Take 10 mg by mouth daily.    [provider]  ?cyclobenzaprine (FLEXERIL) 10 MG tablet Take 1 tablet (10 mg total) by mouth 3 (three) times daily as needed for muscle spasms. ?Patient not taking: Reported on 07/25/2021 03/27/21   [name], MD  ?hydrochlorothiazide (MICROZIDE) 12.5 MG capsule Take 1 capsule  (12.5 mg total) by mouth daily. 07/05/21 10/03/21  [name], MD  ?HYDROcodone-acetaminophen (NORCO/VICODIN) 5-325 MG tablet hydrocodone 5 mg-acetaminophen 325 mg tablet 07/23/21   [provider]  ?methocarbamol (ROBAXIN) 750 MG tablet Take 1 tablet (750 mg total) by mouth 3 (three) times daily as needed (muscle spasm/pain). 05/31/21   Cathren Laine, MD  ?metoprolol succinate (TOPROL-XL) 25 MG 24 hr tablet Take 25 mg by mouth daily. 10/29/20   [provider]  ?ondansetron (ZOFRAN-ODT) 4 MG disintegrating tablet Take by mouth. 07/23/21   [provider]  ?rosuvastatin (CRESTOR) 10 MG tablet Take 1 tablet (10 mg total) by mouth daily. 01/04/21 07/06/22  [name], MD  ?tamsulosin (FLOMAX) 0.4 MG CAPS capsule Take by mouth. 07/23/21 07/28/21  [provider]  ?warfarin (COUMADIN) 10 MG tablet Take 1 1/2 tablets by mouth daily or as directed by the coumadin clinic 05/01/21   [name], MD  ?   ? ?Allergies    ?Cocoa   ? ?Review of Systems   ?Review of Systems  ?Constitutional:  Negative for chills and fever.  ?Respiratory:  Negative for shortness of breath.   ?Cardiovascular:  Negative for chest pain.  ?Gastrointestinal:  Positive for nausea and vomiting. Negative for abdominal pain and diarrhea.  ?Genitourinary:  Positive for decreased urine volume, difficulty urinating and flank pain.  ?Neurological:  Negative for headaches.  ? ?Physical Exam ?Updated Vital Signs ?BP 105/63   Pulse (!) 53  Temp 98 ?F (36.7 ?C) (Oral)   Resp 18   Ht 5\' 11"  (1.803 m)   Wt 74.8 kg   SpO2 99%   BMI 23.01 kg/m?  ?Physical Exam ?Vitals and nursing note reviewed.  ?Constitutional:   ?   General: He is not in acute distress. ?   Appearance: He is not ill-appearing.  ?HENT:  ?   Head: Normocephalic and atraumatic.  ?   Nose: No congestion.  ?Eyes:  ?   Conjunctiva/sclera: Conjunctivae normal.  ?Cardiovascular:  ?   Rate and Rhythm: Normal rate and regular rhythm.  ?   Pulses: Normal pulses.  ?    Heart sounds: No murmur heard. ?  No friction rub. No gallop.  ?Pulmonary:  ?   Effort: No respiratory distress.  ?   Breath sounds: No wheezing, rhonchi or rales.  ?Abdominal:  ?   Palpations: Abdomen is soft.  ?   Tenderness: There is no abdominal tenderness. There is right CVA tenderness. There is no left CVA tenderness.  ?   Comments: Abdomen nondistended, has slight right-sided tenderness as well as CVA tenderness, no guarding, rebound test, peritoneal sign negative Murphy sign McBurney point.  ?Musculoskeletal:  ?   Comments: Spine was visualized no overlying skin changes no step-off versus deformities noted on the spine.  ?Skin: ?   General: Skin is warm and dry.  ?Neurological:  ?   Mental Status: He is alert.  ?Psychiatric:     ?   Mood and Affect: Mood normal.  ? ? ?ED Results / Procedures / Treatments   ?Labs ?(all labs ordered are listed, but only abnormal results are displayed) ?Labs Reviewed  ?BASIC METABOLIC PANEL - Abnormal; Notable for the following components:  ?    Result Value  ? Glucose, Bld 130 (*)   ? All other components within normal limits  ?CBC WITH DIFFERENTIAL/PLATELET - Abnormal; Notable for the following components:  ? WBC 15.2 (*)   ? Neutro Abs 12.3 (*)   ? Monocytes Absolute 1.1 (*)   ? All other components within normal limits  ?URINALYSIS, ROUTINE W REFLEX MICROSCOPIC - Abnormal; Notable for the following components:  ? APPearance HAZY (*)   ? Hgb urine dipstick LARGE (*)   ? Ketones, ur 5 (*)   ? Protein, ur 30 (*)   ? RBC / HPF >50 (*)   ? All other components within normal limits  ? ? ?EKG ?None ? ?Radiology ?CT Renal Stone Study ? ?Result Date: 07/25/2021 ?CLINICAL DATA:  Acute right flank pain. EXAM: CT ABDOMEN AND PELVIS WITHOUT CONTRAST TECHNIQUE: Multidetector CT imaging of the abdomen and pelvis was performed following the standard protocol without IV contrast. RADIATION DOSE REDUCTION: This exam was performed according to the departmental dose-optimization program which  includes automated exposure control, adjustment of the mA and/or kV according to patient size and/or use of iterative reconstruction technique. COMPARISON:  July 23, 2021. FINDINGS: Lower chest: Stable probable small loculated left pleural effusion and associated atelectasis or scarring seen in the left lung base. Stable probable scarring seen in right lung base. Hepatobiliary: No focal liver abnormality is seen. No gallstones, gallbladder wall thickening, or biliary dilatation. Pancreas: Unremarkable. No pancreatic ductal dilatation or surrounding inflammatory changes. Spleen: Normal in size without focal abnormality. Adrenals/Urinary Tract: Adrenal glands appear normal. Stable mild right hydroureteronephrosis is noted secondary to 3 mm calculus in the distal right ureter. Urinary bladder is otherwise unremarkable. Left kidney is unremarkable. Stomach/Bowel: Stomach is within normal  limits. Appendix appears normal. No evidence of bowel wall thickening, distention, or inflammatory changes. Vascular/Lymphatic: No significant vascular findings are present. No enlarged abdominal or pelvic lymph nodes. Reproductive: Prostate is unremarkable. Other: No abdominal wall hernia or abnormality. No abdominopelvic ascites. Musculoskeletal: No acute or significant osseous findings. IMPRESSION: Stable mild right hydroureteronephrosis is noted secondary to 3 mm calculus in distal right ureter. Electronically Signed   By: Lupita Raider M.D.   On: 07/25/2021 13:33   ? ?Procedures ?Procedures  ? ? ?Medications Ordered in ED ?Medications  ?sodium chloride 0.9 % bolus 1,000 mL (0 mLs Intravenous Stopped 07/25/21 1521)  ?HYDROmorphone (DILAUDID) injection 1 mg (1 mg Intravenous Given 07/25/21 1302)  ?ketorolac (TORADOL) 30 MG/ML injection 15 mg (15 mg Intramuscular Given 07/25/21 1521)  ? ? ?ED Course/ Medical Decision Making/ A&P ?  ?                        ?Medical Decision Making ?Amount and/or Complexity of Data Reviewed ?Labs:  ordered. ?Radiology: ordered. ? ?Risk ?Prescription drug management. ? ? ?This patient presents to the ED for concern of right flank tenderness, this involves an extensive number of treatment options, and is a compla

## 2021-07-26 ENCOUNTER — Telehealth: Payer: Self-pay

## 2021-07-26 NOTE — Telephone Encounter (Signed)
Transition Care Management Unsuccessful Follow-up Telephone Call ? ?Date of discharge and from where:  07/25/2021-[place]  ? ?Attempts:  1st Attempt ? ?Reason for unsuccessful TCM follow-up call:  Unable to reach patient ? ?  ?

## 2021-07-27 NOTE — Telephone Encounter (Signed)
Transition Care Management Unsuccessful Follow-up Telephone Call ? ?Date of discharge and from where:  07/25/2021-[place] ? ?Attempts:  2nd Attempt ? ?Reason for unsuccessful TCM follow-up call:  Unable to reach patient ? ?  ?

## 2021-07-28 NOTE — Telephone Encounter (Signed)
Transition Care Management Unsuccessful Follow-up Telephone Call ? ?Date of discharge and from where:  07/25/2021-[place]  ? ?Attempts:  3rd Attempt ? ?Reason for unsuccessful TCM follow-up call:  Unable to reach patient ? ?  ?

## 2021-07-31 ENCOUNTER — Ambulatory Visit: Payer: Medicaid Other | Admitting: Urology

## 2021-07-31 NOTE — Progress Notes (Deleted)
? ?  Assessment: ?1. Ureteral calculus   ? ? ? ?Plan: ?*** ? ?Chief Complaint: No chief complaint on file. ? ? ?History of Present Illness: ? ?Scott [name] is a 46 y.o. year old male who is seen in consultation from Renee Rival, FNP ? for evaluation of ***. ? ? ?Past Medical History:  ?Past Medical History:  ?Diagnosis Date  ? Anxiety   ? Aortic stenosis   ? Murmur, cardiac   ? Pancreatitis, acute 01/28/2017  ? ? ?Past Surgical History:  ?Past Surgical History:  ?Procedure Laterality Date  ? AORTIC VALVE REPAIR    ? when pt was 46 years old  ? CARDIAC SURGERY    ? FINGER SURGERY    ? MECHANICAL AORTIC VALVE REPLACEMENT    ? pt has card with mechanical valve replacemnt details  ? RIGHT/LEFT HEART CATH AND CORONARY ANGIOGRAPHY Bilateral 09/17/2018  ? Procedure: RIGHT/LEFT HEART CATH AND CORONARY ANGIOGRAPHY;  Surgeon: Isaias Cowman, MD;  Location: [facility];  Service: Cardiovascular;  Laterality: Bilateral;  ? ? ?Allergies:  ?Allergies  ?Allergen Reactions  ? Cocoa Hives  ?  Pt sts he is allergic to Coco Puffs  ? ? ?Family History:  ?No family history on file. ? ?Social History:  ?Social History  ? ?Tobacco Use  ? Smoking status: Every Day  ?  Packs/day: 1.50  ?  Years: 30.00  ?  Pack years: 45.00  ?  Types: Cigarettes  ? Smokeless tobacco: Never  ?Vaping Use  ? Vaping Use: Never used  ?Substance Use Topics  ? Alcohol use: Not Currently  ?  Comment: occ  ? Drug use: Yes  ?  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 ? ?Review of symptoms:  ?Constitutional:  Negative for unexplained weight loss, night sweats, fever, chills ?ENT:  Negative for nose bleeds, sinus pain, painful swallowing ?CV:  Negative for chest pain, shortness of breath, exercise intolerance, palpitations, loss of consciousness ?Resp:  Negative for cough, wheezing, shortness of breath ?GI:  Negative for nausea, vomiting, diarrhea, bloody stools ?GU:  Positives noted in HPI; otherwise negative for gross hematuria, dysuria,  urinary incontinence ?Neuro:  Negative for seizures, poor balance, limb weakness, slurred speech ?Psych:  Negative for lack of energy, depression, anxiety ?Endocrine:  Negative for polydipsia, polyuria, symptoms of hypoglycemia (dizziness, hunger, sweating) ?Hematologic:  Negative for anemia, purpura, petechia, prolonged or excessive bleeding, use of anticoagulants  ?Allergic:  Negative for difficulty breathing or choking as a result of exposure to anything; no shellfish allergy; no allergic response (rash/itch) to materials, foods ? ?Physical exam: ?There were no vitals taken for this visit. ?GENERAL APPEARANCE:  Well appearing, well developed, well nourished, NAD ?HEENT: Atraumatic, Normocephalic, oropharynx clear. ?NECK: Supple without lymphadenopathy or thyromegaly. ?LUNGS: Clear to auscultation bilaterally. ?HEART: Regular Rate and Rhythm without murmurs, gallops, or rubs. ?ABDOMEN: Soft, non-tender, No Masses. ?EXTREMITIES: Moves all extremities well.  Without clubbing, cyanosis, or edema. ?NEUROLOGIC:  Alert and oriented x 3, normal gait, CN II-XII grossly intact.  ?MENTAL STATUS:  Appropriate. ?BACK:  Non-tender to palpation.  No CVAT ?SKIN:  Warm, dry and intact.   ? ?Results: ?No results found for this or any previous visit (from the past 24 hour(s)). ? ?

## 2021-08-02 ENCOUNTER — Other Ambulatory Visit: Payer: Self-pay | Admitting: Licensed Clinical Social Worker

## 2021-08-02 NOTE — Patient Outreach (Signed)
Medicaid Managed Care Social Work Note  08/02/2021 Name:  Scott [name] MRN:  [id number] DOB:  1975-10-06  Scott [name] is an 46 y.o. year old male who is a primary patient of Donell Beers, FNP.  The Medicaid Managed Care Coordination team was consulted for assistance with:  Mental Health Counseling and Resources  Scott [name] was given information about Medicaid Managed Care Coordination team services today. [name] Patient agreed to services and verbal consent obtained.  Engaged with patient  for by telephone forfollow up visit in response to referral for case management and/or care coordination services.   Assessments/Interventions:  Review of past medical history, allergies, medications, health status, including review of consultants reports, laboratory and other test data, was performed as part of comprehensive evaluation and provision of chronic care management services.  SDOH: (Social Determinant of Health) assessments and interventions performed: SDOH Interventions    Flowsheet Row Most Recent Value  SDOH Interventions   Stress Interventions Provide Counseling, [employer] Resources       Advanced Directives Status:  Not addressed in this encounter.  Care Plan                 Allergies  Allergen Reactions   Cocoa Hives    Pt sts he is allergic to [facility] Puffs    Medications Reviewed Today     Reviewed by Donell Beers, FNP (Nurse Practitioner) on 07/25/21 at 1208  Med List Status: <None>   Medication Order Taking? Sig Documenting Provider Last Dose Status Informant  acetaminophen (TYLENOL) 325 MG tablet [id number] Yes Take 650 mg by mouth every 6 (six) hours as needed. [provider] Taking Active   albuterol (VENTOLIN HFA) 108 (90 Base) MCG/ACT inhaler [id number] Yes Inhale into the lungs. [provider] Taking Active            Med Note Harriet Pho   Wed Jun 28, 2021 11:17 AM) As needed  cetirizine  (ZYRTEC) 10 MG tablet [id number] Yes Take 10 mg by mouth daily. [provider] Taking Active   cyclobenzaprine (FLEXERIL) 10 MG tablet [id number] No Take 1 tablet (10 mg total) by mouth 3 (three) times daily as needed for muscle spasms.  Patient not taking: Reported on 07/25/2021   [name], MD Not Taking Active   hydrochlorothiazide (MICROZIDE) 12.5 MG capsule [id number] Yes Take 1 capsule (12.5 mg total) by mouth daily. [name], MD Taking Active   HYDROcodone-acetaminophen (NORCO/VICODIN) 5-325 MG tablet [id number] Yes hydrocodone 5 mg-acetaminophen 325 mg tablet [provider] Taking Active   methocarbamol (ROBAXIN) 750 MG tablet [id number]  Take 1 tablet (750 mg total) by mouth 3 (three) times daily as needed (muscle spasm/pain). Cathren Laine, MD  Active            Med Note Laural Benes, [name]   Wed Jun 28, 2021 11:17 AM) As needed  metoprolol succinate (TOPROL-XL) 25 MG 24 hr tablet [id number] Yes Take 25 mg by mouth daily. [provider] Taking Active   ondansetron (ZOFRAN-ODT) 4 MG disintegrating tablet [id number]  Take by mouth. [provider]  Active   rosuvastatin (CRESTOR) 10 MG tablet [id number] Yes Take 1 tablet (10 mg total) by mouth daily. [name], MD Taking Active   tamsulosin [facility]) 0.4 MG CAPS capsule [id number] Yes Take by mouth. [provider] Taking Active   warfarin (COUMADIN) 10 MG tablet [id number] Yes Take 1 1/2 tablets by mouth daily or as directed by the coumadin clinic Tenny Craw,  Sherol Dade, MD Taking Active             Patient Active Problem List   Diagnosis Date Noted   Ureterolithiasis 07/25/2021   Nausea & vomiting 07/25/2021   Anxiety and depression 06/28/2021   Elevated alkaline phosphatase level 06/28/2021   Cluster headache 03/28/2021   Acute midline low back pain 03/28/2021   Encounter for support and coordination of transition of care 03/28/2021   DOE (dyspnea on exertion) 01/11/2021   Chest pain of  unknown etiology 01/11/2021   Encounter for general adult medical examination with abnormal findings 10/04/2020   Screening due 10/04/2020   S/P aortic valve replacement 10/04/2020   Sinusitis, chronic 10/04/2020   Marijuana user 01/28/2017    Conditions to be addressed/monitored per PCP order:  Anxiety and Depression  Care Plan : LCSW plan of care  Updates made by Gustavus Bryant, LCSW since 08/02/2021 12:00 AM     Problem: Anxiety Identification (Anxiety)      Long-Range Goal: Anxiety Symptoms Identified   Start Date: 07/06/2021  Priority: High  Note:   Priority: High  Timeframe:  Long-Range Goal Priority:  High Start Date:   07/05/21              Expected End Date:  ongoing                     Follow Up Date--09/01/21   - check out counseling and psychiatry - keep 90 percent of counseling and psychiatry appointments  Why is this important?             Beating depression may take some time.            If you don't feel better right away, don't give up on your treatment plan.    Current barriers:             Chronic Mental Health needs related to anxiety, depression and issues with adjusting to his multiple health concerns           Mental Health Concerns            Needs Support, Education, and Care Coordination in order to meet unmet mental health needs. Clinical Goal(s): demonstrate a reduction in symptoms related to : anxiety, connect with provider for ongoing mental health treatment.  , and increase coping skills, healthy habits, self-management skills, and stress reduction     Clinical Interventions:            Assessed patient's previous and current treatment, coping skills, support system and barriers to care  ?         Depression screen reviewed  ?         Solution-Focused Strategies ?         Mindfulness or Relaxation Training ?         Active listening / Reflection utilized  ?         Emotional Supportive Provided ?         Behavioral Activation ?          Participation in counseling encouraged  ?         Verbalization of feelings encouraged  ?         Crisis Resource Education / information provided  ?         Suicidal Ideation/Homicidal Ideation assessed: No SI/HI ?         Discussed Health Care Power of Attorney  ?  Discussed referral for counseling and psychiatry           Reviewed various resources, discussed options on available treatment options. Patient is agreeable to referral to [facility] for counseling and psychiatry. He has stable transportation to go to [facility] for visits. Referral made on 07/06/21. Referrals made to [facility] RNCM and Care Guides as well. Patient wishes to increase his overall support network. He has several financial and health related issues that affect his depression and anxiety. [facility] LCSW educated patient on the power of thinking positive. Update 08/02/21- Patient's referral for [facility] was declined due to him not being a [facility] resident. [facility] LCSW encouraged patient to go in as a walk in patient at [facility] to sign up for psychiatry and counseling services. Patient is agreeable to this plan. He was sent an email on 08/02/21 with [facility] contact information. [facility] LCSW rescheduled his no show appointment with [facility] RNCM as well per Schedule Care Guide's request. Patient reports that his only concern recently was that he had a painful kidney stone that he had to pass. He reports that he is has been doing well and implementing appropriate coping skills to prevent stress.  ?         Options for mental health treatment based on need and insurance           Inter-disciplinary care team collaboration (see longitudinal plan of care)           LCSW discussed coping skills for anxiety and depression. SW used empathetic and active and reflective listening, validated feelings/concerns, and provided emotional support. LCSW provided self-care education to help manage his mental health conditions and improve his mood.            Patient  denies any current crises or urgent needs  Patient Goals/Self-Care Activities: Over the next 120 days           Call your insurance provider for more information about your Enhanced Benefits        07/06/2021   12:38 PM 06/28/2021   11:22 AM 05/17/2021   11:55 AM 05/04/2021   11:36 AM 03/28/2021    1:12 PM  Depression screen PHQ 2/9  Decreased Interest 3 2 0 0 0  Down, Depressed, Hopeless 2 2 0 0 0  PHQ - 2 Score 5 4 0 0 0  Altered sleeping 2 2     Tired, decreased energy 2 2     Change in appetite 2 2     Feeling bad or failure about yourself  3 3     Trouble concentrating 3 3     Moving slowly or fidgety/restless 0 0     Suicidal thoughts 1 2     PHQ-9 Score 18 18     Difficult doing work/chores Extremely dIfficult Extremely dIfficult         24- Hour Availability:    [facility]  [address] [place], [place] [phone] Crisis [phone]   [facility] Service of the [facility] [phone]   [place] Crisis [facility]  [phone]    [facility]  [phone] (after hours)   Therapeutic Alternative/Mobile Crisis   [phone]   [place] National Suicide Hotline  (937) 218-0927 (TALK) OR 988   Call 911 or go to emergency room   [facility]  [phone]);  [place] and [employer]  [phone]); [place], [place], [place], [place], [place], [place], [place]  Follow up:  Patient agrees to Care Plan and Follow-up.  Plan: The Managed Medicaid care management team will reach out to the patient again over the next 30 days.  Date/time of next scheduled Social Work care management/care coordination outreach:  09/01/21 at 1:00 pm.   [name], BSW, MSW, LCSW Managed Medicaid LCSW [facility]  [facility] [email or web address] Phone: [phone]

## 2021-08-02 NOTE — Patient Instructions (Signed)
Visit Information ? ?Mr. Halpin was given information about Medicaid Managed Care team care coordination services as a part of their Healthy [facility] Medicaid benefit. Ike Maragh verbally consented to engagement with the [facility] Managed Care team.  ? ?If you are experiencing a medical emergency, please call 911 or report to your local emergency department or urgent care.  ? ?If you have a non-emergency medical problem during routine business hours, please contact your provider's office and ask to speak with a nurse.  ? ?For questions related to your Healthy [facility] health plan, please call: [phone] or visit the homepage here: [email or web address] ? ?If you would like to schedule transportation through your Healthy [facility] plan, please call the following number at least 2 days in advance of your appointment: [phone] ? For information about your ride after you set it up, call Ride Assist at [phone]. Use this number to activate a Will Call pickup, or if your transportation is late for a scheduled pickup. Use this number, too, if you need to make a change or cancel a previously scheduled reservation. ? If you need transportation services right away, call [phone]. The after-hours call center is staffed 24 hours to handle ride assistance and urgent reservation requests (including discharges) 365 days a year. Urgent trips include sick visits, hospital discharge requests and life-sustaining treatment. ? ?Call the [facility] Line at [phone], at any time, 24 hours a day, 7 days a week. If you are in danger or need immediate medical attention call 911. ? ?If you would like help to quit smoking, call 1-800-QUIT-NOW (302-408-3760) OR Espa?ol: 1-855-D?jelo-Ya 9304042124) o para m?s informaci?n haga clic aqu? or Text READY to 200-400 to register via text ? ?Following is a copy of your plan of care:  ?Care Plan : LCSW plan of care   ?Updates made by Gustavus Bryant, LCSW since 08/02/2021 12:00 AM  ?  ? ?Problem: Anxiety Identification (Anxiety)   ?  ? ?Long-Range Goal: Anxiety Symptoms Identified   ?Start Date: 07/06/2021  ?Priority: High  ?Note:   ?Priority: High ? ?Timeframe:  Long-Range Goal ?Priority:  High ?Start Date:   07/05/21              ?Expected End Date:  ongoing                   ?  ?Follow Up Date--09/01/21  ? ?- check out counseling and psychiatry ?- keep 90 percent of counseling and psychiatry appointments ? ?Why is this important?   ?          Beating depression may take some time.  ?          If you don't feel better right away, don't give up on your treatment plan.  ?  ?Current barriers:   ?          Chronic Mental Health needs related to anxiety, depression and issues with adjusting to his multiple health concerns ?          Mental Health Concerns  ?          Needs Support, Education, and Care Coordination in order to meet unmet mental health needs. ?Clinical Goal(s): demonstrate a reduction in symptoms related to : anxiety, connect with provider for ongoing mental health treatment.  , and increase coping skills, healthy habits, self-management skills, and stress reduction     ? ?Patient Goals/Self-Care Activities: Over the next 120 days ?  Call your insurance provider for more information about your Enhanced Benefits  ?   ?[name], BSW, MSW, LCSW ?Managed Medicaid LCSW ?[place]  [facility] ?[email or web address] ?Phone: [phone] ? ? ? ? ?  ? ?  ?

## 2021-08-16 ENCOUNTER — Other Ambulatory Visit: Payer: Self-pay | Admitting: *Deleted

## 2021-08-16 NOTE — Patient Outreach (Signed)
Care Coordination ? ?08/16/2021 ? ?Scott [name] ?06/24/1975 ?[id number] ? ? ?Medicaid Managed Care  ? ?Unsuccessful Outreach Note ? ?08/16/2021 ?Name: Scott [name] MRN: [id number] DOB: September 28, 1975 ? ?Referred by: Donell Beers, FNP ?Reason for referral : High Risk Managed Medicaid (Unsuccessful RNCM initial telephone outreach, 2nd attempt) ? ? ?A second unsuccessful telephone outreach was attempted today. The patient was referred to the case management team for assistance with care management and care coordination.  ? ?Follow Up Plan: A HIPAA compliant phone message was left for the patient providing contact information and requesting a return call.  ?The care management team will reach out to the patient again over the next 14 days.  ? ?Estanislado Emms RN, BSN ?[place]  [facility] ?RN Care Coordinator ? ? ?

## 2021-08-16 NOTE — Patient Instructions (Signed)
Visit Information ? ?Mr. Scott [name]  - as a part of your Medicaid benefit, you are eligible for care management and care coordination services at no cost or copay. I was unable to reach you by phone today but would be happy to help you with your health related needs. Please feel free to call me @ [phone].  ? ?A member of the Managed Medicaid care management team will reach out to you again over the next 14 days.  ? ?Lurena Joiner RN, BSN ?[place] ?RN Care Coordinator ?  ?

## 2021-08-18 ENCOUNTER — Telehealth: Payer: Self-pay | Admitting: Nurse Practitioner

## 2021-08-18 NOTE — Telephone Encounter (Signed)
.. ?  Medicaid Managed Care  ? ?Unsuccessful Outreach Note ? ?08/18/2021 ?Name: Scott [name] MRN: [id number] DOB: 1975-11-07 ? ?Referred by: Donell Beers, FNP ?Reason for referral : High Risk Managed Medicaid (I called the patient today to get him rescheduled with the MM RNCM. I left my name and number on his VM.) ? ? ?A second unsuccessful telephone outreach was attempted today. The patient was referred to the case management team for assistance with care management and care coordination.  ? ?Follow Up Plan: The care management team will reach out to the patient again over the next 14 days.  ? ?Weston Settle ?Care Guide, High Risk Medicaid Managed Care ?Embedded Care Coordination ?[place]  [facility]  ? ? ?SIGNATURE  ?

## 2021-09-01 ENCOUNTER — Other Ambulatory Visit: Payer: Self-pay | Admitting: Licensed Clinical Social Worker

## 2021-09-01 ENCOUNTER — Other Ambulatory Visit: Payer: Self-pay | Admitting: Internal Medicine

## 2021-09-01 NOTE — Telephone Encounter (Signed)
Called pt and made him aware he is overdue for anticoagulation appt and will not be able to continue to receive Warfarin refills until appt is schuled and INR is checked. Appt scheduled in [place] on 09/04/21. Pt verbalized understanding and stated he had 2 weeks left of Warfarin and did not need refill until after upcoming appt.

## 2021-09-01 NOTE — Patient Instructions (Signed)
Visit Information  Scott [name] was given information about Medicaid Managed Care team care coordination services as a part of their [place] Medicaid benefit. Scott [name] verbally consented to engagement with the [facility] Managed Care team.   If you are experiencing a medical emergency, please call 911 or report to your local emergency department or urgent care.   If you have a non-emergency medical problem during routine business hours, please contact your provider's office and ask to speak with a nurse.   For questions related to your [facility], please call: [phone] or visit the homepage here: [email or web address]  If you would like to schedule transportation through your [facility], please call the following number at least 2 days in advance of your appointment: [phone].  Rides for urgent appointments can also be made after hours by calling Member Services.  Call the [place] at [phone], at any time, 24 hours a day, 7 days a week. If you are in danger or need immediate medical attention call 911.  If you would like help to quit smoking, call 1-800-QUIT-NOW 708 712 8076) OR Espaol: 1-855-Djelo-Ya (0-263-785-8850) o para ms informacin haga clic aqu or Text READY to 200-400 to register via text  Following is a copy of your plan of care:  Care Plan : LCSW plan of care  Updates made by [name], LCSW since 09/01/2021 12:00 AM     Problem: Anxiety Identification (Anxiety)      Long-Range Goal: Anxiety Symptoms Identified   Start Date: 07/06/2021  Priority: High  Note:   Priority: High  Timeframe:  Long-Range Goal Priority:  High Start Date:   07/05/21              Expected End Date:  09/01/21. GOAL COMPLETED ON 09/01/21  - check out counseling and psychiatry - keep 90 percent of counseling and psychiatry  appointments  Why is this important?             Beating depression may take some time.            If you don't feel better right away, don't give up on your treatment plan.    Current barriers:             Chronic Mental Health needs related to anxiety, depression and issues with adjusting to his multiple health concerns           Mental Health Concerns            Needs Support, Education, and Care Coordination in order to meet unmet mental health needs. Clinical Goal(s): demonstrate a reduction in symptoms related to : anxiety, connect with provider for ongoing mental health treatment.  , and increase coping skills, healthy habits, self-management skills, and stress reduction      Patient Goals/Self-Care Activities: Over the next 120 days           Call your insurance provider for more information about your Enhanced Benefits        07/06/2021   12:38 PM 06/28/2021   11:22 AM 05/17/2021   11:55 AM 05/04/2021   11:36 AM 03/28/2021    1:12 PM  Depression screen PHQ 2/9  Decreased Interest 3 2 0 0 0  Down, Depressed, Hopeless 2 2 0 0 0  PHQ - 2 Score 5 4 0 0 0  Altered sleeping 2 2     Tired, decreased energy 2 2  Change in appetite 2 2     Feeling bad or failure about yourself  3 3     Trouble concentrating 3 3     Moving slowly or fidgety/restless 0 0     Suicidal thoughts 1 2     PHQ-9 Score 18 18     Difficult doing work/chores Extremely dIfficult Extremely dIfficult         24- Hour Availability:    [facility]  [address] [place], [place] Crisis [phone]   Family Service of the [place]   [place]  [phone]    [place]  [phone] (after hours)   Therapeutic Alternative/Mobile Crisis   [phone]   [place] National Suicide Hotline  949-117-8874 (TALK) OR 988   Call 911 or go to emergency room   [facility]  [phone]);   [place] and [employer]  [phone]); [place], [place], [place], [place], [place], [place], [place]  Eula Fried, BSW, MSW, [employer] Managed Medicaid LCSW [email or web address] Phone: [phone]

## 2021-09-01 NOTE — Telephone Encounter (Signed)
Prescription refill request received for warfarin [place]: 07/05/21 Scott [name])  Next INR check: OVERDUE  Warfarin tablet strength: 10mg   Pt overdue for anticoagulation appt.

## 2021-09-01 NOTE — Patient Outreach (Signed)
Medicaid Managed Care Social Work Note  09/01/2021 Name:  Scott [name] MRN:  [id number] DOB:  1976-03-10  Scott [name] is an 46 y.o. year old male who is a primary patient of Renee Rival, FNP.  The Medicaid Managed Care Coordination team was consulted for assistance with:  Mental Health Counseling and Resources  Scott [name] was given information about Medicaid Managed Care Coordination team services today. Scott [name] Patient agreed to services and verbal consent obtained.  Engaged with patient  for by telephone forfollow up visit in response to referral for case management and/or care coordination services.   Assessments/Interventions:  Review of past medical history, allergies, medications, health status, including review of consultants reports, laboratory and other test data, was performed as part of comprehensive evaluation and provision of chronic care management services.  SDOH: (Social Determinant of Health) assessments and interventions performed: SDOH Interventions    Flowsheet Row Most Recent Value  SDOH Interventions   Stress Interventions Offered [employer], Provide Counseling       Advanced Directives Status:  See Care Plan for related entries.  Care Plan                 Allergies  Allergen Reactions   Cocoa Hives    Pt sts he is allergic to [facility] Puffs    Medications Reviewed Today     Reviewed by Renee Rival, FNP (Nurse Practitioner) on 07/25/21 at 1208  Med List Status: <None>   Medication Order Taking? Sig Documenting Provider Last Dose Status Informant  acetaminophen (TYLENOL) 325 MG tablet [id number] Yes Take 650 mg by mouth every 6 (six) hours as needed. [provider] Taking Active   albuterol (VENTOLIN HFA) 108 (90 Base) MCG/ACT inhaler [id number] Yes Inhale into the lungs. [provider] Taking Active            Med Note [name]   Wed Jun 28, 2021 11:17 AM) As needed  cetirizine  (ZYRTEC) 10 MG tablet [id number] Yes Take 10 mg by mouth daily. [provider] Taking Active   cyclobenzaprine (FLEXERIL) 10 MG tablet [id number] No Take 1 tablet (10 mg total) by mouth 3 (three) times daily as needed for muscle spasms.  Patient not taking: Reported on 8/88/9169   [name], MD Not Taking Active   hydrochlorothiazide (MICROZIDE) 12.5 MG capsule [id number] Yes Take 1 capsule (12.5 mg total) by mouth daily. [name], MD Taking Active   HYDROcodone-acetaminophen (NORCO/VICODIN) 5-325 MG tablet [id number] Yes hydrocodone 5 mg-acetaminophen 325 mg tablet [provider] Taking Active   methocarbamol (ROBAXIN) 750 MG tablet [id number]  Take 1 tablet (750 mg total) by mouth 3 (three) times daily as needed (muscle spasm/pain). [name], MD  Active            Med Note [name], [name]   Wed Jun 28, 2021 11:17 AM) As needed  metoprolol succinate (TOPROL-XL) 25 MG 24 hr tablet [id number] Yes Take 25 mg by mouth daily. [provider] Taking Active   ondansetron (ZOFRAN-ODT) 4 MG disintegrating tablet [id number]  Take by mouth. [provider]  Active   rosuvastatin (CRESTOR) 10 MG tablet [id number] Yes Take 1 tablet (10 mg total) by mouth daily. [name], MD Taking Active   tamsulosin [facility]) 0.4 MG CAPS capsule [id number] Yes Take by mouth. [provider] Taking Active   warfarin (COUMADIN) 10 MG tablet [id number] Yes Take 1 1/2 tablets by mouth daily or as directed by the coumadin clinic  [name], MD Taking Active             Patient Active Problem List   Diagnosis Date Noted   Ureterolithiasis 07/25/2021   Nausea & vomiting 07/25/2021   Anxiety and depression 06/28/2021   Elevated alkaline phosphatase level 06/28/2021   Cluster headache 03/28/2021   Acute midline low back pain 03/28/2021   Encounter for support and coordination of transition of care 03/28/2021   DOE (dyspnea on exertion) 01/11/2021   Chest pain of  unknown etiology 01/11/2021   Encounter for general adult medical examination with abnormal findings 10/04/2020   Screening due 10/04/2020   S/P aortic valve replacement 10/04/2020   Sinusitis, chronic 10/04/2020   Marijuana user 01/28/2017    Conditions to be addressed/monitored per PCP order:  Anxiety  Care Plan : LCSW plan of care  Updates made by [name], LCSW since 09/01/2021 12:00 AM     Problem: Anxiety Identification (Anxiety)      Long-Range Goal: Anxiety Symptoms Identified   Start Date: 07/06/2021  Priority: High  Note:   Priority: High  Timeframe:  Long-Range Goal Priority:  High Start Date:   07/05/21              Expected End Date:  09/01/21. GOAL COMPLETED ON 09/01/21  - check out counseling and psychiatry - keep 90 percent of counseling and psychiatry appointments  Why is this important?             Beating depression may take some time.            If you don't feel better right away, don't give up on your treatment plan.    Current barriers:             Chronic Mental Health needs related to anxiety, depression and issues with adjusting to his multiple health concerns           Mental Health Concerns            Needs Support, Education, and Care Coordination in order to meet unmet mental health needs. Clinical Goal(s): demonstrate a reduction in symptoms related to : anxiety, connect with provider for ongoing mental health treatment.  , and increase coping skills, healthy habits, self-management skills, and stress reduction     Clinical Interventions:            Assessed patient's previous and current treatment, coping skills, support system and barriers to care  ?         Depression screen reviewed  ?         Solution-Focused Strategies ?         Mindfulness or Relaxation Training ?         Active listening / Reflection utilized  ?         Emotional Supportive Provided ?         Behavioral Activation ?         Participation in counseling encouraged   ?         Verbalization of feelings encouraged  ?         Crisis Resource Education / information provided  ?         Suicidal Ideation/Homicidal Ideation assessed: No SI/HI ?         Discussed [place]  ?         Discussed referral for counseling and psychiatry  Reviewed various resources, discussed options on available treatment options. Patient is agreeable to referral to [facility] for counseling and psychiatry. He has stable transportation to go to [facility] for visits. Referral made on 07/06/21. Referrals made to [place] and [facility] as well. Patient wishes to increase his overall support network. He has several financial and health related issues that affect his depression and anxiety. [facility] LCSW educated patient on the power of thinking positive. Update 08/02/21- Patient's referral for [facility] was declined due to him not being a [facility] resident. [facility] LCSW encouraged patient to go in as a walk in patient at [facility] to sign up for psychiatry and counseling services. Patient is agreeable to this plan. He was sent an email on 08/02/21 with [facility] contact information. [facility] LCSW rescheduled his no show appointment with [place] [facility] as well per [facility]'s request. Patient reports that his only concern recently was that he had a painful kidney stone that he had to pass. He reports that he is has been doing well and implementing appropriate coping skills to prevent stress.  ?         Options for mental health treatment based on need and insurance           Inter-disciplinary care team collaboration (see longitudinal plan of care)           LCSW discussed coping skills for anxiety and depression. SW used empathetic and active and reflective listening, validated feelings/concerns, and provided emotional support. LCSW provided self-care education to help manage his mental health conditions and improve his mood.            Patient denies any current crises or urgent needs.  UPDATE 09/01/21- [place] LCSW completed follow up outreach and provided additional education on available mental health resources within his area. Patient was denied psychiatry services with [facility] due to not residing in [facility]. Patient does NOT wish to go to [facility] to gain psychiatry services and this is the only agency in [facility] that accepts Medicaid that provides this service currently. Patient reports that he has heard negative things about this agency and therefore has decided to not pursue mental health treatment. [facility] LCSW ask if patient would like to pursue counseling as there are only options for this service within his area but he declined. Patient reports that he was mainly interested in psychiatry. [facility] LCSW encouraged patient to let [facility] team know if he wishes to gain any other mental health resource education or connection. Patient is agreeable to case closure at this time due to receiving the education he needed on mental health resources. [place] LCSW will update [facility] team.  Patient Goals/Self-Care Activities: Over the next 120 days           Call your insurance provider for more information about your Enhanced Benefits        07/06/2021   12:38 PM 06/28/2021   11:22 AM 05/17/2021   11:55 AM 05/04/2021   11:36 AM 03/28/2021    1:12 PM  Depression screen PHQ 2/9  Decreased Interest 3 2 0 0 0  Down, Depressed, Hopeless 2 2 0 0 0  PHQ - 2 Score 5 4 0 0 0  Altered sleeping 2 2     Tired, decreased energy 2 2     Change in appetite 2 2     Feeling bad or failure about yourself  3 3     Trouble concentrating 3 3  Moving slowly or fidgety/restless 0 0     Suicidal thoughts 1 2     PHQ-9 Score 18 18     Difficult doing work/chores Extremely dIfficult Extremely dIfficult         24- Hour Availability:    [facility]  [address] [place], [place] Crisis [phone]   [facility] [employer]  [place]  [phone]    [place]  [phone] (after hours)   Therapeutic Alternative/Mobile Crisis   [phone]   [place] National Suicide Hotline  463-808-7165 (TALK) OR 988   Call 911 or go to emergency room   [facility]  [phone]);  [place] and [employer]  [phone]); [place], [place], [place], [place], [place], [place], [place]         GOAL MET     Follow up:  Patient requests no follow-up at this time.  Eula Fried, BSW, MSW, [employer] Managed Medicaid LCSW [email or web address] Phone: [phone]

## 2021-09-04 ENCOUNTER — Ambulatory Visit (INDEPENDENT_AMBULATORY_CARE_PROVIDER_SITE_OTHER): Payer: Medicaid Other | Admitting: *Deleted

## 2021-09-04 DIAGNOSIS — Z5181 Encounter for therapeutic drug level monitoring: Secondary | ICD-10-CM | POA: Diagnosis not present

## 2021-09-04 DIAGNOSIS — Z952 Presence of prosthetic heart valve: Secondary | ICD-10-CM | POA: Diagnosis not present

## 2021-09-04 LAB — POCT INR: INR: 1.9 — AB (ref 2.0–3.0)

## 2021-09-04 MED ORDER — WARFARIN SODIUM 10 MG PO TABS
ORAL_TABLET | ORAL | 2 refills | Status: DC
Start: 2021-09-04 — End: 2021-09-22

## 2021-09-04 NOTE — Patient Instructions (Signed)
Take warfarin 2 tablets today then resume 1 1/2 tablets daily except 1 tablet on Tuesdays and Fridays..  Recheck INR in 4 weeks

## 2021-09-22 ENCOUNTER — Telehealth: Payer: Self-pay | Admitting: Internal Medicine

## 2021-09-22 DIAGNOSIS — Z952 Presence of prosthetic heart valve: Secondary | ICD-10-CM

## 2021-09-22 MED ORDER — WARFARIN SODIUM 10 MG PO TABS: ORAL_TABLET | ORAL | 1 refills | Status: DC

## 2021-09-22 NOTE — Telephone Encounter (Signed)
Refill with updated directions sent to pharmacy

## 2021-09-22 NOTE — Telephone Encounter (Signed)
*  STAT* If patient is at the pharmacy, call can be transferred to refill team.   1. Which medications need to be refilled? (please list name of each medication and dose if known)  warfarin (COUMADIN) 10 MG tablet  2. Which pharmacy/location (including street and city if local pharmacy) is medication to be sent to? [id number] [place] [place]  3. Do they need a 30 day or 90 day supply?   90 day supply  Patient is completely out of medication.  Patient's wife states the pharmacy needs approval prior to refilling medication because it is too soon for the patient to pick up his prescription. However, he has been advised to modify his prescription and take more medication depending on his INR. Patient's wife states the pharmacy needs to speak with our office directly or updated specific dose instructions so that when the patient is completely out of medication early than anticipated he can still have it filled.

## 2021-09-28 ENCOUNTER — Ambulatory Visit (INDEPENDENT_AMBULATORY_CARE_PROVIDER_SITE_OTHER): Payer: Medicaid Other | Admitting: *Deleted

## 2021-09-28 DIAGNOSIS — Z952 Presence of prosthetic heart valve: Secondary | ICD-10-CM | POA: Diagnosis not present

## 2021-09-28 DIAGNOSIS — Z5181 Encounter for therapeutic drug level monitoring: Secondary | ICD-10-CM | POA: Diagnosis not present

## 2021-09-28 LAB — POCT INR: INR: 1.6 — AB (ref 2.0–3.0)

## 2021-09-28 NOTE — Patient Instructions (Signed)
Take warfarin 2 tablets today then increase dose to 1 1/2 tablets daily.  Recheck INR in 2 weeks

## 2021-10-09 ENCOUNTER — Telehealth: Payer: Self-pay | Admitting: Internal Medicine

## 2021-10-10 MED ORDER — METOPROLOL SUCCINATE ER 25 MG PO TB24: 25.0000 mg | ORAL_TABLET | Freq: Every day | ORAL | 3 refills | Status: DC

## 2021-10-15 ENCOUNTER — Other Ambulatory Visit: Payer: Self-pay | Admitting: Internal Medicine

## 2021-10-15 DIAGNOSIS — Z952 Presence of prosthetic heart valve: Secondary | ICD-10-CM

## 2021-10-19 ENCOUNTER — Ambulatory Visit (INDEPENDENT_AMBULATORY_CARE_PROVIDER_SITE_OTHER): Payer: Medicaid Other | Admitting: *Deleted

## 2021-10-19 DIAGNOSIS — Z5181 Encounter for therapeutic drug level monitoring: Secondary | ICD-10-CM

## 2021-10-19 DIAGNOSIS — Z952 Presence of prosthetic heart valve: Secondary | ICD-10-CM

## 2021-10-19 LAB — POCT INR: INR: 3.1 — AB (ref 2.0–3.0)

## 2021-10-19 NOTE — Patient Instructions (Signed)
Decrease dose to 1 1/2 tablets daily except 1 tablet on Thursdays Recheck INR in 3 weeks

## 2021-11-13 ENCOUNTER — Ambulatory Visit (INDEPENDENT_AMBULATORY_CARE_PROVIDER_SITE_OTHER): Payer: Medicaid Other | Admitting: Internal Medicine

## 2021-11-13 ENCOUNTER — Encounter: Payer: Self-pay | Admitting: Internal Medicine

## 2021-11-13 ENCOUNTER — Telehealth: Payer: Self-pay

## 2021-11-13 DIAGNOSIS — F419 Anxiety disorder, unspecified: Secondary | ICD-10-CM

## 2021-11-13 DIAGNOSIS — F32A Depression, unspecified: Secondary | ICD-10-CM | POA: Diagnosis not present

## 2021-11-13 NOTE — Progress Notes (Signed)
Virtual Visit via Telephone Note   This visit type was conducted due to national recommendations for restrictions regarding the COVID-19 Pandemic (e.g. social distancing) in an effort to limit this patient's exposure and mitigate transmission in our community.  Due to his co-morbid illnesses, this patient is at least at moderate risk for complications without adequate follow up.  This format is felt to be most appropriate for this patient at this time.  The patient did not have access to video technology/had technical difficulties with video requiring transitioning to audio format only (telephone).  All issues noted in this document were discussed and addressed.  No physical exam could be performed with this format.  Evaluation Performed:  Follow-up visit  Date:  11/13/2021   ID:  Scott [name], DOB [birth year], MRN [id number]  Patient Location: Home Provider Location: Office/Clinic  Participants: Patient Location of Patient: Home Location of Provider: Telehealth Consent was obtain for visit to be over via telehealth. I verified that I am speaking with the correct person using two identifiers.  PCP:  Donell Beers, FNP   Chief Complaint: Anxiety  History of Present Illness:    Scott [name] is a 46 y.o. male who has a daily visit for complaint of spells of anxiety/panic.  He was on Klonopin in the past.  He has tried Wellbutrin in the past as well.  He currently denies any SI or HI.  He requests referral to psychiatry.  Of note, he takes Coumadin for history of mechanical aortic valve.  He also reports having a mole like lesion, which is bumped up over the foot area.  He states that it started as a freckle, and later progressed to a crack, and has had bump over the area.  The patient does not have symptoms concerning for COVID-19 infection (fever, chills, cough, or new shortness of breath).   Past Medical, Surgical, Social History, Allergies, and Medications have been  Reviewed.  Past Medical History:  Diagnosis Date   Anxiety    Aortic stenosis    Murmur, cardiac    Pancreatitis, acute 01/28/2017   Past Surgical History:  Procedure Laterality Date   AORTIC VALVE REPAIR     when pt was 46 years old   CARDIAC SURGERY     FINGER SURGERY     MECHANICAL AORTIC VALVE REPLACEMENT     pt has card with mechanical valve replacemnt details   RIGHT/LEFT HEART CATH AND CORONARY ANGIOGRAPHY Bilateral 09/17/2018   Procedure: RIGHT/LEFT HEART CATH AND CORONARY ANGIOGRAPHY;  Surgeon: Marcina Millard, MD;  Location: [facility] INVASIVE CV LAB;  Service: Cardiovascular;  Laterality: Bilateral;     Current Meds  Medication Sig   acetaminophen (TYLENOL) 325 MG tablet Take 650 mg by mouth every 6 (six) hours as needed.   albuterol (VENTOLIN HFA) 108 (90 Base) MCG/ACT inhaler Inhale into the lungs.   cetirizine (ZYRTEC) 10 MG tablet Take 10 mg by mouth daily.   cyclobenzaprine (FLEXERIL) 10 MG tablet Take 1 tablet (10 mg total) by mouth 3 (three) times daily as needed for muscle spasms.   metoprolol succinate (TOPROL-XL) 25 MG 24 hr tablet Take 1 tablet (25 mg total) by mouth daily.   ondansetron (ZOFRAN-ODT) 4 MG disintegrating tablet Take by mouth.   rosuvastatin (CRESTOR) 10 MG tablet Take 1 tablet (10 mg total) by mouth daily.   warfarin (COUMADIN) 10 MG tablet TAKE 1 TO 2 TABLETS BY MOUTH DAILY OR AS DIRECTED BY COUMADIN CLINIC     Allergies:  Cocoa   ROS:   Please see the history of present illness.     All other systems reviewed and are negative.   Labs/Other Tests and Data Reviewed:    Recent Labs: 12/30/2020: B Natriuretic Peptide 27.0 01/11/2021: TSH 1.10 06/23/2021: ALT 23 07/25/2021: BUN 17; Creatinine, Ser 1.10; Hemoglobin 13.7; Platelets 270; Potassium 3.7; Sodium 137   Recent Lipid Panel Lab Results  Component Value Date/Time   CHOL 132 06/23/2021 10:55 AM   TRIG 119 06/23/2021 10:55 AM   HDL 30 (L) 06/23/2021 10:55 AM   CHOLHDL 4.4  06/23/2021 10:55 AM   CHOLHDL 4.8 12/30/2020 03:37 PM   LDLCALC 80 06/23/2021 10:55 AM    Wt Readings from Last 3 Encounters:  07/25/21 165 lb (74.8 kg)  07/25/21 165 lb (74.8 kg)  07/05/21 165 lb 3.2 oz (74.9 kg)     ASSESSMENT & PLAN:    Anxiety and depression Chronic condition previously taking clonazepam Patient denies SI, HI Patient referred for counseling and referral to psych Would be cautious with SSRI as he is on Coumadin  Foot skin lesion Needs office visit to evaluate as it can be limited to skin vs cyst  Time:   Today, I have spent 9 minutes reviewing the chart, including problem list, medications, and with the patient with telehealth technology discussing the above problems.   Medication Adjustments/Labs and Tests Ordered: Current medicines are reviewed at length with the patient today.  Concerns regarding medicines are outlined above.   Tests Ordered: No orders of the defined types were placed in this encounter.   Medication Changes: No orders of the defined types were placed in this encounter.    Note: This dictation was prepared with Dragon dictation along with smaller phrase technology. Similar sounding words can be transcribed inadequately or may not be corrected upon review. Any transcriptional errors that result from this process are unintentional.      Disposition:  Follow up  Signed, Anabel Halon, MD  11/13/2021 1:53 PM     [facility]

## 2021-11-13 NOTE — Telephone Encounter (Signed)
Patient will need a visit with provider can be virtual to discuss referral

## 2021-11-13 NOTE — Telephone Encounter (Signed)
scheduled

## 2021-11-13 NOTE — Telephone Encounter (Signed)
Patient wife Shiquan Mathieu called needs to know can he get an referral to [facility] in [place].

## 2021-11-13 NOTE — Patient Instructions (Signed)
You are being referred to Psychiatry.  Please schedule in-office visit with your PCP for evaluation of foot lesion.

## 2021-11-13 NOTE — Assessment & Plan Note (Signed)
Chronic condition previously taking clonazepam Patient denies SI, HI Patient referred for counseling and referral to psych

## 2021-11-14 ENCOUNTER — Telehealth: Payer: Medicaid Other | Admitting: Nurse Practitioner

## 2021-11-15 ENCOUNTER — Other Ambulatory Visit: Payer: Self-pay | Admitting: Internal Medicine

## 2021-11-15 DIAGNOSIS — Z952 Presence of prosthetic heart valve: Secondary | ICD-10-CM

## 2021-11-15 MED ORDER — WARFARIN SODIUM 10 MG PO TABS: ORAL_TABLET | ORAL | 3 refills | Status: DC

## 2021-11-15 NOTE — Telephone Encounter (Signed)
Refill request for warfarin:  Last INR was 3.1 on 10/19/21 Next INR is on 11/16/21 LOV was 07/05/21  Refill approved.

## 2021-11-15 NOTE — Telephone Encounter (Signed)
*  STAT* If patient is at the pharmacy, call can be transferred to refill team.   1. Which medications need to be refilled? (please list name of each medication and dose if known) warfarin (COUMADIN) 10 MG tablet  2. Which pharmacy/location (including street and city if local pharmacy) is medication to be sent to? [id number] [place] [place] [facility] [address] [facility] [address]  3. Do they need a 30 day or 90 day supply? 30 day   Patient has not been taking medication because pharmacy kept denying his refill.

## 2021-11-16 ENCOUNTER — Telehealth: Payer: Self-pay | Admitting: Internal Medicine

## 2021-11-16 ENCOUNTER — Other Ambulatory Visit: Payer: Self-pay | Admitting: *Deleted

## 2021-11-16 DIAGNOSIS — Z952 Presence of prosthetic heart valve: Secondary | ICD-10-CM

## 2021-11-16 MED ORDER — WARFARIN SODIUM 10 MG PO TABS: ORAL_TABLET | ORAL | 1 refills | Status: DC

## 2021-11-16 NOTE — Telephone Encounter (Signed)
Spoke with Spouse.  [facility]t rescheduled to 11/28/21.

## 2021-11-16 NOTE — Telephone Encounter (Signed)
Pt c/o medication issue:  1. Name of Medication: warfarin (COUMADIN) 10 MG tablet  2. How are you currently taking this medication (dosage and times per day)? Has not taken in over a week  3. Are you having a reaction (difficulty breathing--STAT)? no  4. What is your medication issue? Patient's wife says the patient has not had any medication in over a week. She says he requested a refill, but could not pick it up because they only have one car. She says he request a refill again and it was denied the second time. Patient has an appointment with the coumadin clinic today at 9:30 am.

## 2021-11-22 ENCOUNTER — Encounter: Payer: Self-pay | Admitting: Nurse Practitioner

## 2021-11-22 ENCOUNTER — Ambulatory Visit (INDEPENDENT_AMBULATORY_CARE_PROVIDER_SITE_OTHER): Payer: Medicaid Other | Admitting: Nurse Practitioner

## 2021-11-22 VITALS — BP 118/68 | HR 63 | Ht 70.0 in | Wt 167.0 lb

## 2021-11-22 DIAGNOSIS — L918 Other hypertrophic disorders of the skin: Secondary | ICD-10-CM | POA: Diagnosis not present

## 2021-11-22 DIAGNOSIS — Z720 Tobacco use: Secondary | ICD-10-CM | POA: Diagnosis not present

## 2021-11-22 DIAGNOSIS — F32A Depression, unspecified: Secondary | ICD-10-CM | POA: Diagnosis not present

## 2021-11-22 DIAGNOSIS — I5032 Chronic diastolic (congestive) heart failure: Secondary | ICD-10-CM

## 2021-11-22 DIAGNOSIS — F419 Anxiety disorder, unspecified: Secondary | ICD-10-CM

## 2021-11-22 DIAGNOSIS — I209 Angina pectoris, unspecified: Secondary | ICD-10-CM | POA: Diagnosis not present

## 2021-11-22 DIAGNOSIS — I480 Paroxysmal atrial fibrillation: Secondary | ICD-10-CM

## 2021-11-22 MED ORDER — ROSUVASTATIN CALCIUM 10 MG PO TABS: 10.0000 mg | ORAL_TABLET | Freq: Every day | ORAL | 1 refills | Status: DC

## 2021-11-22 NOTE — Assessment & Plan Note (Signed)
Denies chest pain today On metoprolol 25 mg daily Patient encouraged to maintain close follow-up with cardiology

## 2021-11-22 NOTE — Assessment & Plan Note (Signed)
PHQ-9 score 15 Denies SI, HI Has upcoming appointment with psychologist

## 2021-11-22 NOTE — Assessment & Plan Note (Signed)
Condition well-controlled on metoprolol 25 mg daily On Coumadin

## 2021-11-22 NOTE — Assessment & Plan Note (Signed)
No complaints of shortness of breath,, no edema noted Patient encouraged to maintain close follow-up with cardiology On metoprolol 25 mg daily, hydrochlorothiazide 12.5 mg daily

## 2021-11-22 NOTE — Assessment & Plan Note (Addendum)
Smokes about 1 pack/day  Asked about quitting: confirms that he/she currently smokes cigarettes Advise to quit smoking: Educated about QUITTING to reduce the risk of cancer, cardio and cerebrovascular disease. Assess willingness: Unwilling to quit at this time, but is working on cutting back. Assist with counseling and pharmacotherapy: Counseled for 5 minutes and literature provided. Arrange for follow up: follow up in6 months and continue to offer help. 

## 2021-11-22 NOTE — Patient Instructions (Addendum)
Please continue to work on smoking cessation as dicussed     it is important that you exercise regularly at least 30 minutes 5 times a week.  Think about what you will eat, plan ahead. Choose " clean, green, fresh or frozen" over canned, processed or packaged foods which are more sugary, salty and fatty. 70 to 75% of food eaten should be vegetables and fruit. Three meals at set times with snacks allowed between meals, but they must be fruit or vegetables. Aim to eat over a 12 hour period , example 7 am to 7 pm, and STOP after  your last meal of the day. Drink water,generally about 64 ounces per day, no other drink is as healthy. Fruit juice is best enjoyed in a healthy way, by EATING the fruit.  Thanks for choosing [facility], we consider it a privelige to serve you.

## 2021-11-22 NOTE — Assessment & Plan Note (Signed)
skin tag noted on the right thigh, skin warm and dry no drainage or redness noted.  Patient told to monitor site. No concern for infection.

## 2021-11-22 NOTE — Progress Notes (Signed)
   Scott [name]     MRN: [id number]      DOB: Aug 26, 1975   HPI Scott [name] with past medical history of cluster headache, paroxysmal A-fib, anxiety and depression, current smoker is here for complaints of a spot on the right thigh that started like a freckle, states that the spot is now raised and bled at the time.  Patient denies itching, fever, chills    Patient continues to smoke 1 pack of cigarettes daily, he has tried Wellbutrin in the past but it did not help. has an upcoming appointment with the psychologist and eating for his anxiety and depression.  Feels that once his anxiety and depression is treated he will be able to come off cigarette smoking.      ROS Denies recent fever or chills. Denies sinus pressure, nasal congestion, ear pain or sore throat. Denies chest congestion, productive cough or wheezing. Denies chest pains, palpitations and leg swelling Denies abdominal pain, nausea, vomiting,diarrhea or constipation.   Denies dysuria, frequency, hesitancy or incontinence. Denies joint pain, swelling and limitation in mobility. Denies headaches, seizures, numbness, or tingling.    PE  BP 118/68 (BP Location: Right Arm, Patient Position: Sitting, Cuff Size: Normal)   Pulse 63   Ht 5\' 10"  (1.778 m)   Wt 167 lb (75.8 kg)   SpO2 96%   BMI 23.96 kg/m   Patient alert and oriented and in no cardiopulmonary distress.   Chest: Clear to auscultation bilaterally.  CVS: S1, S2 no murmurs, no S3.Regular rate.  ABD: Soft non tender.   Ext: No edema  MS: Adequate ROM spine, shoulders, hips and knees.  Skin: skin tag noted on the right thigh, skin warm and dry no drainage or redness noted  Psych: Good eye contact, normal affect. Memory intact not anxious or depressed appearing.  CNS: CN 2-12 intact, power,  normal throughout.no focal deficits noted.   Assessment & Plan Tobacco user Smokes about 1 pack/day  Asked about quitting: confirms that he/she currently  smokes cigarettes Advise to quit smoking: Educated about QUITTING to reduce the risk of cancer, cardio and cerebrovascular disease. Assess willingness: Unwilling to quit at this time, but is working on cutting back. Assist with counseling and pharmacotherapy: Counseled for 5 minutes and literature provided. Arrange for follow up: follow up in 6 months and continue to offer help.  Skin tag skin tag noted on the right thigh, skin warm and dry no drainage or redness noted.  Patient told to monitor site. No concern for infection.  Angina, class II (HCC) Denies chest pain today On metoprolol 25 mg daily Patient encouraged to maintain close follow-up with cardiology  CHF (congestive heart failure), NYHA class II, chronic, diastolic (HCC) No complaints of shortness of breath,, no edema noted Patient encouraged to maintain close follow-up with cardiology On metoprolol 25 mg daily, hydrochlorothiazide 12.5 mg daily  PAF (paroxysmal atrial fibrillation) (HCC) Condition well-controlled on metoprolol 25 mg daily On Coumadin  Anxiety and depression PHQ-9 score 15 Denies SI, HI Has upcoming appointment with psychologist

## 2021-11-28 DIAGNOSIS — F321 Major depressive disorder, single episode, moderate: Secondary | ICD-10-CM | POA: Diagnosis not present

## 2021-11-28 DIAGNOSIS — F410 Panic disorder [episodic paroxysmal anxiety] without agoraphobia: Secondary | ICD-10-CM | POA: Diagnosis not present

## 2021-11-28 DIAGNOSIS — F411 Generalized anxiety disorder: Secondary | ICD-10-CM | POA: Diagnosis not present

## 2021-11-28 DIAGNOSIS — Z79899 Other long term (current) drug therapy: Secondary | ICD-10-CM | POA: Diagnosis not present

## 2021-11-28 DIAGNOSIS — F4312 Post-traumatic stress disorder, chronic: Secondary | ICD-10-CM | POA: Diagnosis not present

## 2021-11-30 ENCOUNTER — Ambulatory Visit (INDEPENDENT_AMBULATORY_CARE_PROVIDER_SITE_OTHER): Payer: Medicaid Other | Admitting: *Deleted

## 2021-11-30 DIAGNOSIS — Z5181 Encounter for therapeutic drug level monitoring: Secondary | ICD-10-CM | POA: Diagnosis not present

## 2021-11-30 DIAGNOSIS — Z952 Presence of prosthetic heart valve: Secondary | ICD-10-CM | POA: Diagnosis not present

## 2021-11-30 LAB — POCT INR: INR: 6.6 — AB (ref 2.0–3.0)

## 2021-11-30 NOTE — Patient Instructions (Signed)
Description   Hold warfarin 8/17, 8/18 and 8/19. Then START taking warfarin 1.5 tablets daily except for 1 tablet on Sunday and Thursday.   Please seek medical attention if you notice any signs of bleeding.

## 2021-12-05 ENCOUNTER — Ambulatory Visit
Admission: EM | Admit: 2021-12-05 | Discharge: 2021-12-05 | Disposition: A | Discharge status: Home / Self Care | Payer: Medicaid Other

## 2021-12-05 DIAGNOSIS — R220 Localized swelling, mass and lump, head: Secondary | ICD-10-CM

## 2021-12-05 DIAGNOSIS — R519 Headache, unspecified: Secondary | ICD-10-CM

## 2021-12-05 MED ORDER — TRAMADOL HCL 50 MG PO TABS
50.0000 mg | ORAL_TABLET | Freq: Three times a day (TID) | ORAL | 0 refills | Status: DC | PRN
Start: 2021-12-05 — End: 2022-07-20

## 2021-12-05 MED ORDER — AMOXICILLIN-POT CLAVULANATE 875-125 MG PO TABS: 1.0000 | ORAL_TABLET | Freq: Two times a day (BID) | ORAL | 0 refills | Status: DC

## 2021-12-05 NOTE — ED Triage Notes (Signed)
Pt reports right ear pain x 1 month; right sided sore throat, hurts to swallow, right sided jaw pain, right eye pain x 1 1/2 week. Tylenol gave relief couple weeks ago.

## 2021-12-05 NOTE — ED Provider Notes (Signed)
[facility]    CSN: [id number] Arrival date & time: 12/05/21  0944      History   Chief Complaint Chief Complaint  Patient presents with   Otalgia   Jaw Pain   Sore Throat         HPI Scott [name] is a 46 y.o. male.   Presenting today with about a month of progressively worsening right-sided ear, throat, jaw pain and swelling much worse the last week to week and a half.  He states he is noticed a localized area of swelling to his right jawline worse with chewing and movement.  Denies fever, chills, injury to the areas, drainage from his ear, known dental issues, nausea, vomiting.  Has been taking significant amounts Tylenol with minimal relief of the pain.  Unable to take NSAIDs due to concurrent Coumadin use   Past Medical History:  Diagnosis Date   Anxiety    Aortic stenosis    Murmur, cardiac    Pancreatitis, acute 01/28/2017    Patient Active Problem List   Diagnosis Date Noted   Skin tag 11/22/2021   Ureterolithiasis 07/25/2021   Nausea & vomiting 07/25/2021   Anxiety and depression 06/28/2021   Elevated alkaline phosphatase level 06/28/2021   Cluster headache 03/28/2021   Acute midline low back pain 03/28/2021   Encounter for support and coordination of transition of care 03/28/2021   DOE (dyspnea on exertion) 01/11/2021   Chest pain of unknown etiology 01/11/2021   Encounter for general adult medical examination with abnormal findings 10/04/2020   Screening due 10/04/2020   S/P aortic valve replacement 10/04/2020   Sinusitis, chronic 10/04/2020   Chronic anticoagulation 11/20/2018   Subtherapeutic international normalized ratio (INR) 11/20/2018   Decreased activities of daily living (ADL) 10/24/2018   Decreased strength, endurance, and mobility 10/24/2018   PAF (paroxysmal atrial fibrillation) (HCC) 10/24/2018   RBBB (right bundle branch block) 10/22/2018   Angina, class II (HCC) 10/20/2018   Bullous emphysema (HCC) 10/20/2018   CHF  (congestive heart failure), NYHA class II, chronic, diastolic (HCC) 10/20/2018   S/P repair of coarctation of aorta 10/20/2018   Subclavian artery stenosis, left (HCC) 10/20/2018   Tobacco user 10/19/2018   Anxiety 06/12/2018   Cervical radiculopathy at C6 06/12/2018   Panic attacks 06/12/2018   Marijuana user 01/28/2017    Past Surgical History:  Procedure Laterality Date   AORTIC VALVE REPAIR     when pt was 46 years old   CARDIAC SURGERY     FINGER SURGERY     MECHANICAL AORTIC VALVE REPLACEMENT     pt has card with mechanical valve replacemnt details   RIGHT/LEFT HEART CATH AND CORONARY ANGIOGRAPHY Bilateral 09/17/2018   Procedure: RIGHT/LEFT HEART CATH AND CORONARY ANGIOGRAPHY;  Surgeon: Marcina Millard, MD;  Location: [facility];  Service: Cardiovascular;  Laterality: Bilateral;       Home Medications    Prior to Admission medications   Medication Sig Start Date End Date Taking? Authorizing Provider  amoxicillin-clavulanate (AUGMENTIN) 875-125 MG tablet Take 1 tablet by mouth every 12 (twelve) hours. 12/05/21  Yes Particia Nearing, PA-C  traMADol (ULTRAM) 50 MG tablet Take 1 tablet (50 mg total) by mouth 3 (three) times daily as needed. 12/05/21  Yes Particia Nearing, PA-C  acetaminophen (TYLENOL) 325 MG tablet Take 650 mg by mouth every 6 (six) hours as needed.    [provider]  albuterol (VENTOLIN HFA) 108 (90 Base) MCG/ACT inhaler Inhale into the lungs.  12/31/19   [provider]  buPROPion (WELLBUTRIN XL) 150 MG 24 hr tablet Take 150 mg by mouth every morning. 11/28/21   [provider]  cetirizine (ZYRTEC) 10 MG tablet Take 10 mg by mouth daily.    [provider]  clonazePAM (KLONOPIN) 0.5 MG tablet Take 0.25-0.5 mg by mouth daily as needed. 11/28/21   [provider]  cyclobenzaprine (FLEXERIL) 10 MG tablet Take 1 tablet (10 mg total) by mouth 3 (three) times daily as needed for muscle spasms. 03/27/21    [name], MD  hydrochlorothiazide (MICROZIDE) 12.5 MG capsule Take 1 capsule (12.5 mg total) by mouth daily. 07/05/21 10/03/21  [name], MD  metoprolol succinate (TOPROL-XL) 25 MG 24 hr tablet Take 1 tablet (25 mg total) by mouth daily. 10/10/21   [name], MD  ondansetron (ZOFRAN-ODT) 4 MG disintegrating tablet Take by mouth. Patient not taking: Reported on 11/22/2021 07/23/21   [provider]  rosuvastatin (CRESTOR) 10 MG tablet Take 1 tablet (10 mg total) by mouth daily. 11/22/21   Donell Beers, FNP  warfarin (COUMADIN) 10 MG tablet Take 1 1/2 tablets daily as directed 11/16/21   [name], MD    Family History History reviewed. No pertinent family history.  Social History Social History   Tobacco Use   Smoking status: Every Day    Packs/day: 1.50    Years: 30.00    Total pack years: 45.00    Types: Cigarettes   Smokeless tobacco: Never  Vaping Use   Vaping Use: Never used  Substance Use Topics   Alcohol use: Not Currently    Comment: occ   Drug use: Yes    Types: Marijuana    Comment: daily use     Allergies   Cocoa   Review of Systems Review of Systems PER HPI  Physical Exam Triage Vital Signs ED Triage Vitals  Enc Vitals Group     BP 12/05/21 1032 125/77     Pulse Rate 12/05/21 1032 68     Resp 12/05/21 1032 18     Temp 12/05/21 1032 98.2 F (36.8 C)     Temp Source 12/05/21 1032 Oral     SpO2 12/05/21 1032 99 %     Weight --      Height --      Head Circumference --      Peak Flow --      Pain Score 12/05/21 1030 8     Pain Loc --      Pain Edu? --      Excl. in GC? --    No data found.  Updated Vital Signs BP 125/77 (BP Location: Right Arm)   Pulse 68   Temp 98.2 F (36.8 C) (Oral)   Resp 18   SpO2 99%   Visual Acuity Right Eye Distance:   Left Eye Distance:   Bilateral Distance:    Right Eye Near:   Left Eye Near:    Bilateral Near:     Physical Exam Vitals and nursing note reviewed.  Constitutional:       Appearance: Normal appearance.  HENT:     Head: Atraumatic.     Right Ear: Tympanic membrane normal.     Left Ear: Tympanic membrane normal.     Nose: Nose normal.     Mouth/Throat:     Mouth: Mucous membranes are moist.     Pharynx: Oropharynx is clear. No oropharyngeal exudate or posterior oropharyngeal erythema.  Comments: Localized area of firm swelling to right jawline tender to palpation.  No discoloration, warmth, fluctuance Eyes:     Extraocular Movements: Extraocular movements intact.     Conjunctiva/sclera: Conjunctivae normal.  Cardiovascular:     Rate and Rhythm: Normal rate.     Pulses: Normal pulses.  Pulmonary:     Effort: Pulmonary effort is normal.  Musculoskeletal:        General: Normal range of motion.     Cervical back: Normal range of motion and neck supple.  Lymphadenopathy:     Cervical: No cervical adenopathy.  Skin:    General: Skin is warm and dry.  Neurological:     General: No focal deficit present.     Mental Status: He is oriented to person, place, and time.  Psychiatric:        Mood and Affect: Mood normal.        Thought Content: Thought content normal.        Judgment: Judgment normal.    UC Treatments / Results  Labs (all labs ordered are listed, but only abnormal results are displayed) Labs Reviewed - No data to display  EKG   Radiology No results found.  Procedures Procedures (including critical care time)  Medications Ordered in UC Medications - No data to display  Initial Impression / Assessment and Plan / UC Course  I have reviewed the triage vital signs and the nursing notes.  Pertinent labs & imaging results that were available during my care of the patient were reviewed by me and considered in my medical decision making (see chart for details).     Possibly infectious etiology, however unclear source at this time.  Treat with Augmentin, very small supply of tramadol given for as needed severe pain as he is  unable to take anti-inflammatory medications due to his other health issues.  He plans to call the Coumadin clinic today prior to taking the antibiotics to make sure his Coumadin is stable enough to tolerate this.  Many close follow-up with primary care provider for recheck and further evaluation if needed.  Final Clinical Impressions(s) / UC Diagnoses   Final diagnoses:  Facial swelling  Facial pain   Discharge Instructions   None    ED Prescriptions     Medication Sig Dispense Auth. Provider   amoxicillin-clavulanate (AUGMENTIN) 875-125 MG tablet Take 1 tablet by mouth every 12 (twelve) hours. 20 tablet Particia Nearing, [place]   traMADol (ULTRAM) 50 MG tablet Take 1 tablet (50 mg total) by mouth 3 (three) times daily as needed. 15 tablet Particia Nearing, [place]      I have reviewed the PDMP during this encounter.   Particia Nearing, [place] 12/05/21 1330

## 2021-12-26 DIAGNOSIS — F410 Panic disorder [episodic paroxysmal anxiety] without agoraphobia: Secondary | ICD-10-CM | POA: Diagnosis not present

## 2021-12-26 DIAGNOSIS — F321 Major depressive disorder, single episode, moderate: Secondary | ICD-10-CM | POA: Diagnosis not present

## 2021-12-26 DIAGNOSIS — F4312 Post-traumatic stress disorder, chronic: Secondary | ICD-10-CM | POA: Diagnosis not present

## 2021-12-26 DIAGNOSIS — F411 Generalized anxiety disorder: Secondary | ICD-10-CM | POA: Diagnosis not present

## 2022-01-23 ENCOUNTER — Telehealth: Payer: Self-pay | Admitting: Internal Medicine

## 2022-01-23 ENCOUNTER — Ambulatory Visit: Payer: Medicaid Other | Attending: Internal Medicine | Admitting: *Deleted

## 2022-01-23 DIAGNOSIS — Z952 Presence of prosthetic heart valve: Secondary | ICD-10-CM | POA: Diagnosis not present

## 2022-01-23 DIAGNOSIS — Z5181 Encounter for therapeutic drug level monitoring: Secondary | ICD-10-CM

## 2022-01-23 DIAGNOSIS — F411 Generalized anxiety disorder: Secondary | ICD-10-CM | POA: Diagnosis not present

## 2022-01-23 DIAGNOSIS — F410 Panic disorder [episodic paroxysmal anxiety] without agoraphobia: Secondary | ICD-10-CM | POA: Diagnosis not present

## 2022-01-23 DIAGNOSIS — F321 Major depressive disorder, single episode, moderate: Secondary | ICD-10-CM | POA: Diagnosis not present

## 2022-01-23 DIAGNOSIS — F4312 Post-traumatic stress disorder, chronic: Secondary | ICD-10-CM | POA: Diagnosis not present

## 2022-01-23 LAB — POCT INR: INR: 4 — AB (ref 2.0–3.0)

## 2022-01-23 NOTE — Telephone Encounter (Signed)
Patient's wife called stating he is going to be having dental surgery tomorrow.  What his INR is be today, and if he needs to be on antibiotics prior to surgery and after surgery.

## 2022-01-23 NOTE — Patient Instructions (Addendum)
Hold warfarin tonight then decrease dose to 1 tablet daily except 1 1/2 tablets on Mondays and Thursdays.  Pending dental procedure tomorrow at [facility].  Per pt they do not need surgical clearance and have given pt RX for Amoxicillin for SBE and infection.  Copy of report printed for surgeon with INR result today.

## 2022-01-23 NOTE — Telephone Encounter (Addendum)
Called and spoke with wife.  Pt went to dentist today for pain in wisdom tooth area.  Was told be has a bad infection including the jaw and sinuses.  Needs emergent surgery per wife at [facility] in [place].  Also needs RX for SBE antibiotic.  Told wife we need a surgical consent request from dental office saying what the exact procedure will be and if pt needs to be off warfarin.  She will call dental office and have surgical consent faxed to our office at [phone].   Pt came in office for INR check.  See coumadin notes.

## 2022-01-30 DIAGNOSIS — R231 Pallor: Secondary | ICD-10-CM | POA: Diagnosis not present

## 2022-01-30 DIAGNOSIS — R404 Transient alteration of awareness: Secondary | ICD-10-CM | POA: Diagnosis not present

## 2022-01-30 DIAGNOSIS — R55 Syncope and collapse: Secondary | ICD-10-CM | POA: Diagnosis not present

## 2022-01-30 DIAGNOSIS — I10 Essential (primary) hypertension: Secondary | ICD-10-CM | POA: Diagnosis not present

## 2022-01-30 DIAGNOSIS — I4510 Unspecified right bundle-branch block: Secondary | ICD-10-CM | POA: Diagnosis not present

## 2022-01-31 DIAGNOSIS — F99 Mental disorder, not otherwise specified: Secondary | ICD-10-CM | POA: Diagnosis not present

## 2022-02-08 ENCOUNTER — Ambulatory Visit: Payer: Medicaid Other | Attending: Internal Medicine | Admitting: *Deleted

## 2022-02-08 DIAGNOSIS — Z5181 Encounter for therapeutic drug level monitoring: Secondary | ICD-10-CM

## 2022-02-08 DIAGNOSIS — Z952 Presence of prosthetic heart valve: Secondary | ICD-10-CM | POA: Diagnosis not present

## 2022-02-08 LAB — POCT INR: POC INR: 4.2

## 2022-02-08 NOTE — Patient Instructions (Signed)
Description    Hold warfarin today then START taking warfarin 1 tablet daily. Recheck INR in 2 weeks. Eat and extra serving of greens today.

## 2022-02-14 ENCOUNTER — Telehealth: Payer: Self-pay | Admitting: Internal Medicine

## 2022-02-14 DIAGNOSIS — R079 Chest pain, unspecified: Secondary | ICD-10-CM

## 2022-02-14 NOTE — Telephone Encounter (Signed)
Pt reports having an abscess tooth 3 wks ago. Pt had the tooth removed and was placed on antibiotics. Pt reports that 4 days ago he developed chest pain and arm numbness on the left side. He is able to push on his ribs to reproduce the pain. He states that it's hard to get a deep breath. Pt states that SOB is worse when laying on his left side or laying flat. He denies nausea and vomiting at this time. Does report that he did have a fever prior to having the tooth pulled, but does not have a fever now. Please advise.

## 2022-02-14 NOTE — Telephone Encounter (Signed)
Patient's wife states a few weeks ago patient had tooth removed due to abscess (see 10/10 encounter). Since then patient has taken antibiotics and overall feels really bad. Over the past few days he has had SOB and left sided chest pain. Patient's wife states they are concerned and hoping the initial tooth infection didn't spread and develop into endocarditis.  Pt c/o of Chest Pain: STAT if CP now or developed within 24 hours  1. Are you having CP right now?  Yes   2. Are you experiencing any other symptoms (ex. SOB, nausea, vomiting, sweating)?  Left arm/hand numbness, SOB  3. How long have you been experiencing CP?  Past few days   4. Is your CP continuous or coming and going?  Coming and going   5. Have you taken Nitroglycerin?  Patient's wife states the patient doesn't have nitro, but he has been taking Tylenol ?

## 2022-02-16 NOTE — Telephone Encounter (Signed)
[name], MD  Karolyn Messing, Marikay Alar, LPN Should get a CBC is not done   Is he still having fevers   Chest pain atypoical for heart pain   Worse pressing He had an echo in March 2023   Limited echo to evluate valve Should be seen in next few wks if possible  Pt notified and agrees with plan of care. All orders placed.

## 2022-02-22 ENCOUNTER — Ambulatory Visit: Payer: Medicaid Other

## 2022-02-22 ENCOUNTER — Encounter: Payer: Self-pay | Admitting: Emergency Medicine

## 2022-02-22 ENCOUNTER — Ambulatory Visit (INDEPENDENT_AMBULATORY_CARE_PROVIDER_SITE_OTHER): Payer: Medicaid Other

## 2022-02-22 ENCOUNTER — Ambulatory Visit
Admission: EM | Admit: 2022-02-22 | Discharge: 2022-02-22 | Disposition: A | Discharge status: Home / Self Care | Payer: Medicaid Other | Attending: Family Medicine | Admitting: Family Medicine

## 2022-02-22 ENCOUNTER — Other Ambulatory Visit: Payer: Self-pay | Admitting: Family Medicine

## 2022-02-22 ENCOUNTER — Other Ambulatory Visit: Payer: Self-pay

## 2022-02-22 DIAGNOSIS — R0781 Pleurodynia: Secondary | ICD-10-CM

## 2022-02-22 DIAGNOSIS — R0789 Other chest pain: Secondary | ICD-10-CM | POA: Diagnosis not present

## 2022-02-22 DIAGNOSIS — R079 Chest pain, unspecified: Secondary | ICD-10-CM | POA: Diagnosis not present

## 2022-02-22 MED ORDER — LIDOCAINE 5 % EX PTCH: 1.0000 | MEDICATED_PATCH | CUTANEOUS | 0 refills | Status: DC

## 2022-02-22 MED ORDER — TIZANIDINE HCL 4 MG PO CAPS: 4.0000 mg | ORAL_CAPSULE | Freq: Three times a day (TID) | ORAL | 0 refills | Status: DC | PRN

## 2022-02-22 NOTE — ED Triage Notes (Addendum)
Pt reports left side rib pain that is worse with movement and hurts to take deep breath x2 weeks.   Pt reports fell on left side after trying to corral dog away from chicken and reports additional possible injury during accidental overdose when exposed to fentanyl powder a few weeks ago.   Denies any known fevers, chest expansion symmetrical. Reports intermittent productive cough in am.

## 2022-02-22 NOTE — ED Provider Notes (Signed)
[facility]    CSN: [id number] Arrival date & time: 02/22/22  0847      History   Chief Complaint Chief Complaint  Patient presents with   Rib Injury    HPI [place] [name] is a 46 y.o. male.   Presenting today with 2-week very of left anterior rib pain after a mechanical fall trying to corral his dog away from some chickens.  He thinks the area is a bit swollen but no bruising, difficulty breathing or chest pain.  States the pain is worse with deep breaths and movement.  Trying Tylenol and heat with minimal relief.    Past Medical History:  Diagnosis Date   Anxiety    Aortic stenosis    Murmur, cardiac    Pancreatitis, acute 01/28/2017    Patient Active Problem List   Diagnosis Date Noted   Skin tag 11/22/2021   Ureterolithiasis 07/25/2021   Nausea & vomiting 07/25/2021   Anxiety and depression 06/28/2021   Elevated alkaline phosphatase level 06/28/2021   Cluster headache 03/28/2021   Acute midline low back pain 03/28/2021   Encounter for support and coordination of transition of care 03/28/2021   DOE (dyspnea on exertion) 01/11/2021   Chest pain of unknown etiology 01/11/2021   Encounter for general adult medical examination with abnormal findings 10/04/2020   Screening due 10/04/2020   S/P aortic valve replacement 10/04/2020   Sinusitis, chronic 10/04/2020   Chronic anticoagulation 11/20/2018   Subtherapeutic international normalized ratio (INR) 11/20/2018   Decreased activities of daily living (ADL) 10/24/2018   Decreased strength, endurance, and mobility 10/24/2018   PAF (paroxysmal atrial fibrillation) (HCC) 10/24/2018   RBBB (right bundle branch block) 10/22/2018   Angina, class II (HCC) 10/20/2018   Bullous emphysema (HCC) 10/20/2018   CHF (congestive heart failure), NYHA class II, chronic, diastolic (HCC) 10/20/2018   S/P repair of coarctation of aorta 10/20/2018   Subclavian artery stenosis, left (HCC) 10/20/2018   Tobacco user  10/19/2018   Anxiety 06/12/2018   Cervical radiculopathy at C6 06/12/2018   Panic attacks 06/12/2018   Marijuana user 01/28/2017    Past Surgical History:  Procedure Laterality Date   AORTIC VALVE REPAIR     when pt was 46 years old   CARDIAC SURGERY     FINGER SURGERY     MECHANICAL AORTIC VALVE REPLACEMENT     pt has card with mechanical valve replacemnt details   RIGHT/LEFT HEART CATH AND CORONARY ANGIOGRAPHY Bilateral 09/17/2018   Procedure: RIGHT/LEFT HEART CATH AND CORONARY ANGIOGRAPHY;  Surgeon: Marcina Millard, MD;  Location: [facility];  Service: Cardiovascular;  Laterality: Bilateral;       Home Medications    Prior to Admission medications   Medication Sig Start Date End Date Taking? Authorizing Provider  lidocaine (LIDODERM) 5 % Place 1 patch onto the skin daily. Remove & Discard patch within 12 hours or as directed by MD 02/22/22  Yes Particia Nearing, PA-C  tiZANidine (ZANAFLEX) 4 MG capsule Take 1 capsule (4 mg total) by mouth 3 (three) times daily as needed for muscle spasms. 02/22/22  Yes Particia Nearing, PA-C  acetaminophen (TYLENOL) 325 MG tablet Take 650 mg by mouth every 6 (six) hours as needed.    [provider]  albuterol (VENTOLIN HFA) 108 (90 Base) MCG/ACT inhaler Inhale into the lungs. 12/31/19   [provider]  amoxicillin-clavulanate (AUGMENTIN) 875-125 MG tablet Take 1 tablet by mouth every 12 (twelve) hours. 12/05/21   Roosvelt Maser  Lanora Manis, PA-C  buPROPion (WELLBUTRIN XL) 150 MG 24 hr tablet Take 150 mg by mouth every morning. 11/28/21   [provider]  cetirizine (ZYRTEC) 10 MG tablet Take 10 mg by mouth daily.    [provider]  clonazePAM (KLONOPIN) 0.5 MG tablet Take 0.25-0.5 mg by mouth daily as needed. 11/28/21   [provider]  cyclobenzaprine (FLEXERIL) 10 MG tablet Take 1 tablet (10 mg total) by mouth 3 (three) times daily as needed for muscle spasms. 03/27/21   [name], MD  hydrochlorothiazide (MICROZIDE) 12.5 MG capsule Take 1 capsule (12.5 mg total) by mouth daily. 07/05/21 10/03/21  [name], MD  metoprolol succinate (TOPROL-XL) 25 MG 24 hr tablet Take 1 tablet (25 mg total) by mouth daily. 10/10/21   [name], MD  ondansetron (ZOFRAN-ODT) 4 MG disintegrating tablet Take by mouth. Patient not taking: Reported on 11/22/2021 07/23/21   [provider]  rosuvastatin (CRESTOR) 10 MG tablet Take 1 tablet (10 mg total) by mouth daily. 11/22/21   Paseda, Baird Kay, FNP  traMADol (ULTRAM) 50 MG tablet Take 1 tablet (50 mg total) by mouth 3 (three) times daily as needed. 12/05/21   Particia Nearing, PA-C  warfarin (COUMADIN) 10 MG tablet Take 1 1/2 tablets daily as directed 11/16/21   [name], MD    Family History History reviewed. No pertinent family history.  Social History Social History   Tobacco Use   Smoking status: Every Day    Packs/day: 1.50    Years: 30.00    Total pack years: 45.00    Types: Cigarettes   Smokeless tobacco: Never  Vaping Use   Vaping Use: Never used  Substance Use Topics   Alcohol use: Not Currently    Comment: occ   Drug use: Yes    Types: Marijuana    Comment: daily use     Allergies   Cocoa   Review of Systems Review of Systems Per HPI  Physical Exam Triage Vital Signs ED Triage Vitals [02/22/22 0921]  Enc Vitals Group     BP 119/79     Pulse Rate 62     Resp 20     Temp 98.2 F (36.8 C)     Temp Source Oral     SpO2 98 %     Weight      Height      Head Circumference      Peak Flow      Pain Score 6     Pain Loc      Pain Edu?      Excl. in GC?    No data found.  Updated Vital Signs BP 119/79 (BP Location: Right Arm)   Pulse 62   Temp 98.2 F (36.8 C) (Oral)   Resp 20   SpO2 98%   Visual Acuity Right Eye Distance:   Left Eye Distance:   Bilateral Distance:    Right Eye Near:   Left Eye Near:    Bilateral Near:     Physical Exam Vitals and nursing  note reviewed.  Constitutional:      Appearance: Normal appearance.  HENT:     Head: Atraumatic.  Eyes:     Extraocular Movements: Extraocular movements intact.     Conjunctiva/sclera: Conjunctivae normal.  Cardiovascular:     Rate and Rhythm: Normal rate and regular rhythm.  Pulmonary:     Effort: Pulmonary effort is normal. No respiratory distress.  Breath sounds: Normal breath sounds. No wheezing or rales.     Comments: Chest rise symmetric bilaterally Musculoskeletal:        General: Tenderness and signs of injury present. No swelling or deformity. Normal range of motion.     Cervical back: Normal range of motion and neck supple.     Comments: Localized left anterior rib tenderness to palpation without bony deformity palpable  Skin:    General: Skin is warm and dry.     Findings: No bruising or erythema.  Neurological:     General: No focal deficit present.     Mental Status: He is oriented to person, place, and time.  Psychiatric:        Mood and Affect: Mood normal.        Thought Content: Thought content normal.        Judgment: Judgment normal.      UC Treatments / Results  Labs (all labs ordered are listed, but only abnormal results are displayed) Labs Reviewed - No data to display  EKG   Radiology DG Ribs Unilateral W/Chest Left  Result Date: 02/22/2022 CLINICAL DATA:  Left anterior chest pain for 2 weeks. Mechanical fall. Left rib pain. EXAM: LEFT RIBS AND CHEST - 3+ VIEW COMPARISON:  None Available. FINDINGS: No fracture or other bone lesions are seen involving the ribs. There is no evidence of pneumothorax or pleural effusion. Hyperinflated lungs concerning for COPD. Heart size and mediastinal contours are within normal limits. IMPRESSION: 1. No rib fractures identified. 2. Hyperinflated lungs concerning for COPD. Electronically Signed   By: Larose Hires D.O.   On: 02/22/2022 09:44    Procedures Procedures (including critical care time)  Medications  Ordered in UC Medications - No data to display  Initial Impression / Assessment and Plan / UC Course  I have reviewed the triage vital signs and the nursing notes.  Pertinent labs & imaging results that were available during my care of the patient were reviewed by me and considered in my medical decision making (see chart for details).     Exam and vitals reassuring today and x-ray of the chest and left ribs negative for acute bony abnormality.  Treat with Zanaflex, lidocaine patches, heat, massage, Tylenol as needed.  Return for worsening symptoms.  Final Clinical Impressions(s) / UC Diagnoses   Final diagnoses:  Rib pain on left side   Discharge Instructions   None    ED Prescriptions     Medication Sig Dispense Auth. Provider   tiZANidine (ZANAFLEX) 4 MG capsule Take 1 capsule (4 mg total) by mouth 3 (three) times daily as needed for muscle spasms. 15 capsule Particia Nearing, PA-C   lidocaine (LIDODERM) 5 % Place 1 patch onto the skin daily. Remove & Discard patch within 12 hours or as directed by MD 30 patch Particia Nearing, PA-C      PDMP not reviewed this encounter.   Particia Nearing, [place] 02/22/22 1136

## 2022-02-26 ENCOUNTER — Ambulatory Visit: Payer: Medicaid Other | Attending: Internal Medicine

## 2022-03-01 ENCOUNTER — Ambulatory Visit (HOSPITAL_COMMUNITY): Payer: Medicaid Other

## 2022-03-12 NOTE — Progress Notes (Deleted)
Cardiology Office Note:    Date:  03/12/2022   ID:  [name], DOB 1975-12-05, MRN [id number]  PCP:  Donell Beers, FNP  [facility] Providers Cardiologist:  Dietrich Pates, MD { Click to update primary MD,subspecialty MD or APP then REFRESH:1}  *** Referring MD: Donell Beers, FNP   Chief Complaint:  No chief complaint on file. {Click here for Visit Info    :1}    History of Present Illness:   Abdalla Naramore is a 46 y.o. male with a history of coarctation of the aorta (s/p repair), L subclavian stenosis, congenital AS(s/p Merrilee Jansky prcedure at age 34 and [employer] AVR (10/2018), chest pain, PAF(post AVR surgery; on amiodarone), hydrothorax (postop),  COPD   Previously followed by Drs Fransico Michael ([facility]) and [facility] Note cath in 2020 presurgery showed no sigificant CAD     Seen in Jan 2022 Fransico Michael) with some L sided CP   Complained of some dyspnea   Metoprlol was cut back  HIgh Rec CT ordered   No signficant abnormalities     Pulmonary appt     Patient saw Dr. Tenny Craw in Sept 2022   He had run out of coumadin  Complained of intermitt CP        Patient called in 02/14/22 reporting abscessed tooth and took antibiotics. He developed chest pain reproduced by pushing on his ribs and sob with laying down. Dr. Tenny Craw ordered a CBC and limited echo-neither have been done.    Past Medical History:  Diagnosis Date   Anxiety    Aortic stenosis    Murmur, cardiac    Pancreatitis, acute 01/28/2017   Current Medications: No outpatient medications have been marked as taking for the 03/26/22 encounter (Appointment) with Dyann Kief, PA-C.    Allergies:   Cocoa   Social History   Tobacco Use   Smoking status: Every Day    Packs/day: 1.50    Years: 30.00    Total pack years: 45.00    Types: Cigarettes   Smokeless tobacco: Never  Vaping Use   Vaping Use: Never used  Substance Use Topics   Alcohol use: Not Currently    Comment: occ   Drug use: Yes    Types: Marijuana     Comment: daily use    Family Hx: The patient's family history is not on file.  ROS     Physical Exam:    VS:  There were no vitals taken for this visit.    Wt Readings from Last 3 Encounters:  11/22/21 167 lb (75.8 kg)  07/25/21 165 lb (74.8 kg)  07/25/21 165 lb (74.8 kg)    Physical Exam  GEN: Well nourished, well developed, in no acute distress  HEENT: normal  Neck: no JVD, carotid bruits, or masses Cardiac:RRR; no murmurs, rubs, or gallops  Respiratory:  clear to auscultation bilaterally, normal work of breathing GI: soft, nontender, nondistended, + BS Ext: without cyanosis, clubbing, or edema, Good distal pulses bilaterally MS: no deformity or atrophy  Skin: warm and dry, no rash Neuro:  Alert and Oriented x 3, Strength and sensation are intact Psych: euthymic mood, full affect        EKGs/Labs/Other Test Reviewed:    EKG:  EKG is *** ordered today.  The ekg ordered today demonstrates ***  Recent Labs: 06/23/2021: ALT 23 07/25/2021: BUN 17; Creatinine, Ser 1.10; Hemoglobin 13.7; Platelets 270; Potassium 3.7; Sodium 137   Recent Lipid Panel Recent Labs    06/23/21  1055  CHOL 132  TRIG 119  HDL 30*  LDLCALC 80     Prior CV Studies: {Select studies to display:[id number]}  Echo   06/28/21   eft ventricular ejection fraction, by estimation, is 65 to 70%. The left ventricle has normal function. The left ventricle has no regional wall motion abnormalities. There is mild left ventricular hypertrophy. Left ventricular diastolic parameters were normal. 1. Right ventricular systolic function is normal. The right ventricular size is normal. There is normal pulmonary artery systolic pressure. The estimated right ventricular systolic pressure is 21.3 mmHg. 2. 3. The mitral valve is grossly normal. Trivial mitral valve regurgitation. The aortic valve has been repaired/replaced. Aortic valve regurgitation is trivial. There is a 21 mm St. Jude mechanical valve present in  the aortic position. Procedure Date: 11/03/18. Aortic valve mean gradient measures 17.2 mmHg. 4. 5. Aortic arch not well visualized. The inferior vena cava is normal in size with greater than 50% respiratory variability, suggesting right atrial pressure of 3 mmHg. 6. Comparison(s): Prior images unable to be directly viewed.   Risk Assessment/Calculations/Metrics:   {Does this patient have ATRIAL FIBRILLATION?[phone]     No BP recorded.  {Refresh Note OR Click here to enter BP  :1}***    ASSESSMENT & PLAN:   No problem-specific Assessment & Plan notes found for this encounter.   history of coarctation of the aorta (s/p repair),   L subclavian stenosis, congenital AS(s/p Merrilee Jansky prcedure at age 53 and [employer] Jude AVR (10/2018)  PAF on amiodarone  Hydrothorax post op        {Are you ordering a CV Procedure (e.g. stress test, cath, DCCV, TEE, etc)?   Press F2        :[id number]}   Dispo:  No follow-ups on file.   Medication Adjustments/Labs and Tests Ordered: Current medicines are reviewed at length with the patient today.  Concerns regarding medicines are outlined above.  Tests Ordered: No orders of the defined types were placed in this encounter.  Medication Changes: No orders of the defined types were placed in this encounter.  Signed, Jacolyn Reedy, PA-C  03/12/2022 3:36 PM    [facility] [address], [place], [place]  [address] Phone: [phone]; Fax: [phone]

## 2022-03-13 ENCOUNTER — Ambulatory Visit: Payer: Medicaid Other | Attending: Cardiology | Admitting: *Deleted

## 2022-03-13 DIAGNOSIS — Z952 Presence of prosthetic heart valve: Secondary | ICD-10-CM

## 2022-03-13 DIAGNOSIS — Z5181 Encounter for therapeutic drug level monitoring: Secondary | ICD-10-CM | POA: Diagnosis not present

## 2022-03-13 LAB — POCT INR: INR: 1.9 — AB (ref 2.0–3.0)

## 2022-03-13 NOTE — Patient Instructions (Signed)
Take warfarin 1 1/2 tablets tonight then resume 1 tablet daily. Recheck INR in 3 weeks.  Gave pt lab order to have INR drawn at [facility] due to transportation issues.

## 2022-03-22 DIAGNOSIS — F411 Generalized anxiety disorder: Secondary | ICD-10-CM | POA: Diagnosis not present

## 2022-03-22 DIAGNOSIS — F410 Panic disorder [episodic paroxysmal anxiety] without agoraphobia: Secondary | ICD-10-CM | POA: Diagnosis not present

## 2022-03-22 DIAGNOSIS — F4312 Post-traumatic stress disorder, chronic: Secondary | ICD-10-CM | POA: Diagnosis not present

## 2022-03-22 DIAGNOSIS — F321 Major depressive disorder, single episode, moderate: Secondary | ICD-10-CM | POA: Diagnosis not present

## 2022-03-26 ENCOUNTER — Ambulatory Visit: Payer: Medicaid Other | Admitting: Physician Assistant

## 2022-04-05 ENCOUNTER — Telehealth: Payer: Self-pay | Admitting: *Deleted

## 2022-04-05 NOTE — Telephone Encounter (Signed)
Pt was due to have INR checked on 04/03/2022. Called pt- no answer. Called pt's wife who stated that pt will have INR checked on 04/10/2022 because that it when pt has off work. Will update coumadin clinic follow up book.

## 2022-04-05 NOTE — Telephone Encounter (Signed)
Called pt since he is overdue for INR monitoring and was due to go to the lab on 04/03/2022. There was no answer therefore left a message for him to call back either the [place] office or [place] office for an update. Will await.

## 2022-04-10 ENCOUNTER — Other Ambulatory Visit (HOSPITAL_COMMUNITY)
Admission: RE | Admit: 2022-04-10 | Discharge: 2022-04-10 | Disposition: A | Discharge status: Home / Self Care | Payer: Medicaid Other | Source: Ambulatory Visit | Attending: Internal Medicine | Admitting: Internal Medicine

## 2022-04-10 DIAGNOSIS — Z5181 Encounter for therapeutic drug level monitoring: Secondary | ICD-10-CM | POA: Insufficient documentation

## 2022-04-10 DIAGNOSIS — Z952 Presence of prosthetic heart valve: Secondary | ICD-10-CM | POA: Diagnosis present

## 2022-04-10 DIAGNOSIS — R079 Chest pain, unspecified: Secondary | ICD-10-CM | POA: Insufficient documentation

## 2022-04-10 LAB — CBC
HCT: 40.1 % (ref 39.0–52.0)
Hemoglobin: 13.4 g/dL (ref 13.0–17.0)
MCH: 31.7 pg (ref 26.0–34.0)
MCHC: 33.4 g/dL (ref 30.0–36.0)
MCV: 94.8 fL (ref 80.0–100.0)
Platelets: 239 10*3/uL (ref 150–400)
RBC: 4.23 MIL/uL (ref 4.22–5.81)
RDW: 13.5 % (ref 11.5–15.5)
WBC: 7.9 10*3/uL (ref 4.0–10.5)
nRBC: 0 % (ref 0.0–0.2)

## 2022-04-10 LAB — PROTIME-INR
INR: 1.2 (ref 0.8–1.2)
Prothrombin Time: 15.1 seconds (ref 11.4–15.2)

## 2022-04-11 ENCOUNTER — Ambulatory Visit (INDEPENDENT_AMBULATORY_CARE_PROVIDER_SITE_OTHER): Payer: Medicaid Other | Admitting: *Deleted

## 2022-04-11 DIAGNOSIS — Z952 Presence of prosthetic heart valve: Secondary | ICD-10-CM

## 2022-04-11 DIAGNOSIS — Z5181 Encounter for therapeutic drug level monitoring: Secondary | ICD-10-CM

## 2022-04-11 NOTE — Patient Instructions (Signed)
Denies missing doses. Take warfarin 2 tablets tonight, 1 1/2 tablets tomorrow night then resume 1 tablet daily. Recheck INR in 2 weeks.  Mailed pt lab order to have INR drawn at [facility] due to transportation issues.

## 2022-04-24 ENCOUNTER — Encounter: Payer: Medicaid Other | Admitting: Nurse Practitioner

## 2022-05-21 ENCOUNTER — Other Ambulatory Visit: Payer: Self-pay | Admitting: Nurse Practitioner

## 2022-06-07 ENCOUNTER — Ambulatory Visit (INDEPENDENT_AMBULATORY_CARE_PROVIDER_SITE_OTHER): Payer: Medicaid Other

## 2022-06-07 ENCOUNTER — Encounter: Payer: Self-pay | Admitting: Physician Assistant

## 2022-06-07 ENCOUNTER — Ambulatory Visit: Payer: Medicaid Other | Attending: Physician Assistant | Admitting: Physician Assistant

## 2022-06-07 ENCOUNTER — Encounter: Payer: Self-pay | Admitting: *Deleted

## 2022-06-07 VITALS — BP 126/60 | HR 67 | Ht 70.0 in | Wt 155.4 lb

## 2022-06-07 DIAGNOSIS — R002 Palpitations: Secondary | ICD-10-CM

## 2022-06-07 DIAGNOSIS — Z720 Tobacco use: Secondary | ICD-10-CM | POA: Diagnosis not present

## 2022-06-07 DIAGNOSIS — Q249 Congenital malformation of heart, unspecified: Secondary | ICD-10-CM

## 2022-06-07 DIAGNOSIS — Z952 Presence of prosthetic heart valve: Secondary | ICD-10-CM | POA: Diagnosis not present

## 2022-06-07 DIAGNOSIS — Z79899 Other long term (current) drug therapy: Secondary | ICD-10-CM

## 2022-06-07 DIAGNOSIS — E785 Hyperlipidemia, unspecified: Secondary | ICD-10-CM

## 2022-06-07 DIAGNOSIS — I10 Essential (primary) hypertension: Secondary | ICD-10-CM

## 2022-06-07 DIAGNOSIS — R079 Chest pain, unspecified: Secondary | ICD-10-CM | POA: Diagnosis not present

## 2022-06-07 LAB — CBC

## 2022-06-07 MED ORDER — HYDROCHLOROTHIAZIDE 12.5 MG PO CAPS: 12.5000 mg | ORAL_CAPSULE | Freq: Every day | ORAL | 3 refills | Status: DC

## 2022-06-07 NOTE — Progress Notes (Unsigned)
Enrolled patient for a 14 day Zio XT monitor to be mailed to patients home  Dr Harrington Challenger to read

## 2022-06-07 NOTE — Progress Notes (Signed)
Office Visit    Patient Name: [place] [name] Date of Encounter: 06/07/2022  PCP:  Kathyrn Lass   [place]  Cardiologist:  Dorris Carnes, MD  Advanced Practice Provider:  No care team member to display Electrophysiologist:  None   HPI    [place] [name] is a 47 y.o. male with a past medical history of coarctation of the aorta (status post repair), left subclavian stenosis, congenital AAS (status post Beatrice Lecher procedure at age 31 and [place] AVR 10/2018), chest pain, PAF (post AVR surgery, on amiodarone), hydrothorax (postop), COPD previously followed by Drs. Tobe Sos ([facility]) and [facility], cardiac catheterization presurgery in 2020 showed no significant CAD presents today for follow-up appointment.  Seen January 2022 Tobe Sos) with some left-sided chest pain at that time.  Complained of some dyspnea.  Metoprolol was cut back.  High-resolution CT ordered without any significant abnormalities.  Pulmonary appointment was arranged.  He was seen by Dr. Harrington Challenger September 2022 and had run out of Coumadin at that time.  Complained of intermittent chest pain.    Last seen 07/05/2021 by Dr. Harrington Challenger and patient states that he gets "gassed" whenever he tries to do things.  Endorsed some shortness of breath.  Denied chest pain, dizziness, palpitations.  He was back on his Coumadin.  He needed to have dental work done at that time.  Today, he tells me that he went back to work 3 months ago and when he started working he does not take a lunch.  He has palpitations and shortness of breath.  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  He has not had his INR checked since November.  He has 5 kids that keep him really busy.  He also ran out of his HCTZ and has noticed some swelling in his hands.  Reports no chest pain, pressure, or tightness. No edema, orthopnea, PND.   Past Medical History    Past Medical History:  Diagnosis Date   Anxiety    Aortic stenosis    Murmur,  cardiac    Pancreatitis, acute 01/28/2017   Past Surgical History:  Procedure Laterality Date   AORTIC VALVE REPAIR     when pt was 47 years old   CARDIAC SURGERY     FINGER SURGERY     MECHANICAL AORTIC VALVE REPLACEMENT     pt has card with mechanical valve replacemnt details   RIGHT/LEFT HEART CATH AND CORONARY ANGIOGRAPHY Bilateral 09/17/2018   Procedure: RIGHT/LEFT HEART CATH AND CORONARY ANGIOGRAPHY;  Surgeon: Isaias Cowman, MD;  Location: [facility];  Service: Cardiovascular;  Laterality: Bilateral;    Allergies  Allergies  Allergen Reactions   Cocoa Hives    Pt sts he is allergic to [facility]     EKGs/Labs/Other Studies Reviewed:   The following studies were reviewed today: Echo   06/28/21   Left ventricular ejection fraction, by estimation, is 65 to 70%. The left ventricle has normal function. The left ventricle has no regional wall motion abnormalities. There is mild left ventricular hypertrophy. Left ventricular diastolic parameters were normal. 1. Right ventricular systolic function is normal. The right ventricular size is normal. There is normal pulmonary artery systolic pressure. The estimated right ventricular systolic pressure is [id number] mmHg. 2. 3. The mitral valve is grossly normal. Trivial mitral valve regurgitation. The aortic valve has been repaired/replaced. Aortic valve regurgitation is trivial. There is a 21 mm St. Jude mechanical valve present in the aortic position.  Procedure Date: 11/03/18. Aortic valve mean gradient measures 17.2 mmHg. 4. 5. Aortic arch not well visualized. The inferior vena cava is normal in size with greater than 50% respiratory variability, suggesting right atrial pressure of 3 mmHg. 6. Comparison(s): Prior images unable to be directly viewed.  EKG:  EKG is  ordered today.  The ekg ordered today demonstrates old right bundle branch block, normal sinus rhythm rate 66 bpm  Recent Labs: 06/23/2021: ALT  23 06/07/2022: BUN 21; Creatinine, Ser 0.99; Hemoglobin WILL FOLLOW; Magnesium 2.3; Platelets WILL FOLLOW; Potassium 4.7; Sodium 140  Recent Lipid Panel    Component Value Date/Time   CHOL 132 06/23/2021 1055   TRIG 119 06/23/2021 1055   HDL 30 (L) 06/23/2021 1055   CHOLHDL 4.4 06/23/2021 1055   CHOLHDL 4.8 12/30/2020 1537   VLDL 14 12/30/2020 1537   LDLCALC 80 06/23/2021 1055     Home Medications   Current Meds  Medication Sig   acetaminophen (TYLENOL) 325 MG tablet Take 650 mg by mouth every 6 (six) hours as needed.   albuterol (VENTOLIN HFA) 108 (90 Base) MCG/ACT inhaler Inhale into the lungs.   amoxicillin-clavulanate (AUGMENTIN) 875-125 MG tablet Take 1 tablet by mouth every 12 (twelve) hours.   buPROPion (WELLBUTRIN XL) 150 MG 24 hr tablet Take 150 mg by mouth every morning.   cetirizine (ZYRTEC) 10 MG tablet Take 10 mg by mouth daily.   clonazePAM (KLONOPIN) 0.5 MG tablet Take 0.25-0.5 mg by mouth daily as needed.   cyclobenzaprine (FLEXERIL) 10 MG tablet Take 1 tablet (10 mg total) by mouth 3 (three) times daily as needed for muscle spasms.   lidocaine (LIDODERM) 5 % Place 1 patch onto the skin daily. Remove & Discard patch within 12 hours or as directed by MD   metoprolol succinate (TOPROL-XL) 25 MG 24 hr tablet Take 1 tablet (25 mg total) by mouth daily.   ondansetron (ZOFRAN-ODT) 4 MG disintegrating tablet Take by mouth.   rosuvastatin (CRESTOR) 10 MG tablet Take 1 tablet (10 mg total) by mouth daily.   tiZANidine (ZANAFLEX) 4 MG capsule Take 1 capsule (4 mg total) by mouth 3 (three) times daily as needed for muscle spasms.   warfarin (COUMADIN) 10 MG tablet Take 1 1/2 tablets daily as directed     Review of Systems      All other systems reviewed and are otherwise negative except as noted above.  Physical Exam    VS:  BP 126/60   Pulse 67   Ht 5' 10"$  (1.778 m)   Wt 155 lb 6.4 oz (70.5 kg)   SpO2 98%   BMI 22.30 kg/m  , BMI Body mass index is 22.3 kg/m.  Wt  Readings from Last 3 Encounters:  06/07/22 155 lb 6.4 oz (70.5 kg)  11/22/21 167 lb (75.8 kg)  07/25/21 165 lb (74.8 kg)     GEN: Well nourished, well developed, in no acute distress. HEENT: normal. Neck: Supple, no JVD, carotid bruits, or masses. Cardiac: RRR, no murmurs, rubs, or gallops. No clubbing, cyanosis, edema.  Radials/PT 2+ and equal bilaterally.  Respiratory:  Respirations regular and unlabored, clear to auscultation bilaterally. GI: Soft, nontender, nondistended. MS: No deformity or atrophy. Skin: Warm and dry, no rash. Neuro:  Strength and sensation are intact. Psych: Normal affect.  Assessment & Plan    Congenital heart disease -congenital AS and coarctation of aorta, last surgery AVR 2020 -on coumadin, check INR -update echo  Dyspnea/fatigue/lightheaded -update echo -zio monitor -labs ordered including  an INR -refill medications -discussed proper hydration  Hyperlipidemia -LDL  80, triglycerides 119 -due for repeat labs the next time he comes in -continue current medications  Tobacco abuse -cessation advised       Disposition: Follow up 6 weeks with Dorris Carnes, MD or APP.  Signed, Elgie Collard, PA-C 06/07/2022, 10:42 PM [facility]

## 2022-06-07 NOTE — Patient Instructions (Signed)
Medication Instructions:  Your physician recommends that you continue on your current medications as directed. Please refer to the Current Medication list given to you today.  *If you need a refill on your cardiac medications before your next appointment, please call your pharmacy*   Lab Work: BMP, Mag, CBC today If you have labs (blood work) drawn today and your tests are completely normal, you will receive your results only by: [place] (if you have MyChart) OR A paper copy in the mail If you have any lab test that is abnormal or we need to change your treatment, we will call you to review the results.   Testing/Procedures: Your physician has requested that you have an echocardiogram. Echocardiography is a painless test that uses sound waves to create images of your heart. It provides your doctor with information about the size and shape of your heart and how well your heart's chambers and valves are working. This procedure takes approximately one hour. There are no restrictions for this procedure. Please do NOT wear cologne, perfume, aftershave, or lotions (deodorant is allowed). Please arrive 15 minutes prior to your appointment time.   ZIO XT- Long Term Monitor Instructions  Your physician has requested you wear a ZIO patch monitor for 14 days.  This is a single patch monitor. [employer] supplies one patch monitor per enrollment. Additional stickers are not available. Please do not apply patch if you will be having a Nuclear Stress Test,  Echocardiogram, Cardiac CT, MRI, or Chest Xray during the period you would be wearing the  monitor. The patch cannot be worn during these tests. You cannot remove and re-apply the  ZIO XT patch monitor.  Your ZIO patch monitor will be mailed 3 day USPS to your address on file. It may take 3-5 days  to receive your monitor after you have been enrolled.  Once you have received your monitor, please review the enclosed instructions. Your monitor   has already been registered assigning a specific monitor serial # to you.  Billing and Patient Assistance Program Information  We have supplied [employer] with any of your insurance information on file for billing purposes. [employer] offers a sliding scale Patient Assistance Program for patients that do not have  insurance, or whose insurance does not completely cover the cost of the ZIO monitor.  You must apply for the Patient Assistance Program to qualify for this discounted rate.  To apply, please call [employer] at [phone], select option 4, select option 2, ask to apply for  Patient Assistance Program. Theodore Demark will ask your household income, and how many people  are in your household. They will quote your out-of-pocket cost based on that information.  [employer] will also be able to set up a 8-month interest-free payment plan if needed.  Applying the monitor   Shave hair from upper left chest.  Hold abrader disc by orange tab. Rub abrader in 40 strokes over the upper left chest as  indicated in your monitor instructions.  Clean area with 4 enclosed alcohol pads. Let dry.  Apply patch as indicated in monitor instructions. Patch will be placed under collarbone on left  side of chest with arrow pointing upward.  Rub patch adhesive wings for 2 minutes. Remove white label marked "1". Remove the white  label marked "2". Rub patch adhesive wings for 2 additional minutes.  While looking in a mirror, press and release button in center of patch. A small green light will  flash 3-4 times. This will be  your only indicator that the monitor has been turned on.  Do not shower for the first 24 hours. You may shower after the first 24 hours.  Press the button if you feel a symptom. You will hear a small click. Record Date, Time and  Symptom in the Patient Logbook.  When you are ready to remove the patch, follow instructions on the last 2 pages of Patient  Logbook. Stick patch monitor onto the last page  of Patient Logbook.  Place Patient Logbook in the blue and white box. Use locking tab on box and tape box closed  securely. The blue and white box has prepaid postage on it. Please place it in the mailbox as  soon as possible. Your physician should have your test results approximately 7 days after the  monitor has been mailed back to [facility].  Call Lily at [phone] if you have questions regarding  your ZIO XT patch monitor. Call them immediately if you see an orange light blinking on your  monitor.  If your monitor falls off in less than 4 days, contact our Monitor department at [phone].  If your monitor becomes loose or falls off after 4 days call [employer] at [phone] for  suggestions on securing your monitor    Follow-Up: At [facility], you and your health needs are our priority.  As part of our continuing mission to provide you with exceptional heart care, we have created designated Provider Care Teams.  These Care Teams include your primary Cardiologist (physician) and Advanced Practice Providers (APPs -  Physician Assistants and Nurse Practitioners) who all work together to provide you with the care you need, when you need it.   Your next appointment:   6 week(s)  Provider:   Dorris Carnes, MD  or Nicholes Rough, PA-C      Other Instructions Aim for 64 oz of water daily  Low-Sodium Eating Plan Sodium, which is an element that makes up salt, helps you maintain a healthy balance of fluids in your body. Too much sodium can increase your blood pressure and cause fluid and waste to be held in your body. Your health care provider or dietitian may recommend following this plan if you have high blood pressure (hypertension), kidney disease, liver disease, or heart failure. Eating less sodium can help lower your blood pressure, reduce swelling, and protect your heart, liver, and kidneys. What are tips for following this plan? Reading food  labels The Nutrition Facts label lists the amount of sodium in one serving of the food. If you eat more than one serving, you must multiply the listed amount of sodium by the number of servings. Choose foods with less than 140 mg of sodium per serving. Avoid foods with 300 mg of sodium or more per serving. Shopping  Look for lower-sodium products, often labeled as "low-sodium" or "no salt added." Always check the sodium content, even if foods are labeled as "unsalted" or "no salt added." Buy fresh foods. Avoid canned foods and pre-made or frozen meals. Avoid canned, cured, or processed meats. Buy breads that have less than 80 mg of sodium per slice. Cooking  Eat more home-cooked food and less restaurant, buffet, and fast food. Avoid adding salt when cooking. Use salt-free seasonings or herbs instead of table salt or sea salt. Check with your health care provider or pharmacist before using salt substitutes. Cook with plant-based oils, such as canola, sunflower, or olive oil. Meal planning When eating at a restaurant,  ask that your food be prepared with less salt or no salt, if possible. Avoid dishes labeled as brined, pickled, cured, smoked, or made with soy sauce, miso, or teriyaki sauce. Avoid foods that contain MSG (monosodium glutamate). MSG is sometimes added to [place] food, bouillon, and some canned foods. Make meals that can be grilled, baked, poached, roasted, or steamed. These are generally made with less sodium. General information Most people on this plan should limit their sodium intake to 1,500-2,000 mg (milligrams) of sodium each day. What foods should I eat? Fruits Fresh, frozen, or canned fruit. Fruit juice. Vegetables Fresh or frozen vegetables. "No salt added" canned vegetables. "No salt added" tomato sauce and paste. Low-sodium or reduced-sodium tomato and vegetable juice. Grains Low-sodium cereals, including oats, puffed wheat and rice, and shredded wheat. Low-sodium  crackers. Unsalted rice. Unsalted pasta. Low-sodium bread. Whole-grain breads and whole-grain pasta. Meats and other proteins Fresh or frozen (no salt added) meat, poultry, seafood, and fish. Low-sodium canned tuna and salmon. Unsalted nuts. Dried peas, beans, and lentils without added salt. Unsalted canned beans. Eggs. Unsalted nut butters. Dairy Milk. Soy milk. Cheese that is naturally low in sodium, such as ricotta cheese, fresh mozzarella, or Swiss cheese. Low-sodium or reduced-sodium cheese. Cream cheese. Yogurt. Seasonings and condiments Fresh and dried herbs and spices. Salt-free seasonings. Low-sodium mustard and ketchup. Sodium-free salad dressing. Sodium-free light mayonnaise. Fresh or refrigerated horseradish. Lemon juice. Vinegar. Other foods Homemade, reduced-sodium, or low-sodium soups. Unsalted popcorn and pretzels. Low-salt or salt-free chips. The items listed above may not be a complete list of foods and beverages you can eat. Contact a dietitian for more information. What foods should I avoid? Vegetables Sauerkraut, pickled vegetables, and relishes. Olives. [place] fries. Onion rings. Regular canned vegetables (not low-sodium or reduced-sodium). Regular canned tomato sauce and paste (not low-sodium or reduced-sodium). Regular tomato and vegetable juice (not low-sodium or reduced-sodium). Frozen vegetables in sauces. Grains Instant hot cereals. Bread stuffing, pancake, and biscuit mixes. Croutons. Seasoned rice or pasta mixes. Noodle soup cups. Boxed or frozen macaroni and cheese. Regular salted crackers. Self-rising flour. Meats and other proteins Meat or fish that is salted, canned, smoked, spiced, or pickled. Precooked or cured meat, such as sausages or meat loaves. Berniece Salines. Ham. Pepperoni. Hot dogs. Corned beef. Chipped beef. Salt pork. Jerky. Pickled herring. Anchovies and sardines. Regular canned tuna. Salted nuts. Dairy Processed cheese and cheese spreads. Hard cheeses. Cheese  curds. Blue cheese. Feta cheese. String cheese. Regular cottage cheese. Buttermilk. Canned milk. Fats and oils Salted butter. Regular margarine. Ghee. Bacon fat. Seasonings and condiments Onion salt, garlic salt, seasoned salt, table salt, and sea salt. Canned and packaged gravies. Worcestershire sauce. Tartar sauce. Barbecue sauce. Teriyaki sauce. Soy sauce, including reduced-sodium. Steak sauce. Fish sauce. Oyster sauce. Cocktail sauce. Horseradish that you find on the shelf. Regular ketchup and mustard. Meat flavorings and tenderizers. Bouillon cubes. Hot sauce. Pre-made or packaged marinades. Pre-made or packaged taco seasonings. Relishes. Regular salad dressings. Salsa. Other foods Salted popcorn and pretzels. Corn chips and puffs. Potato and tortilla chips. Canned or dried soups. Pizza. Frozen entrees and pot pies. The items listed above may not be a complete list of foods and beverages you should avoid. Contact a dietitian for more information. Summary Eating less sodium can help lower your blood pressure, reduce swelling, and protect your heart, liver, and kidneys. Most people on this plan should limit their sodium intake to 1,500-2,000 mg (milligrams) of sodium each day. Canned, boxed, and frozen foods are high in  sodium. Restaurant foods, fast foods, and pizza are also very high in sodium. You also get sodium by adding salt to food. Try to cook at home, eat more fresh fruits and vegetables, and eat less fast food and canned, processed, or prepared foods. This information is not intended to replace advice given to you by your health care provider. Make sure you discuss any questions you have with your health care provider. Document Revised: 05/08/2019 Document Reviewed: 03/04/2019 Elsevier Patient Education  [place].

## 2022-06-08 LAB — BASIC METABOLIC PANEL
BUN/Creatinine Ratio: 21 — ABNORMAL HIGH (ref 9–20)
BUN: 21 mg/dL (ref 6–24)
CO2: 23 mmol/L (ref 20–29)
Calcium: 9.9 mg/dL (ref 8.7–10.2)
Chloride: 104 mmol/L (ref 96–106)
Creatinine, Ser: 0.99 mg/dL (ref 0.76–1.27)
Glucose: 86 mg/dL (ref 70–99)
Potassium: 4.7 mmol/L (ref 3.5–5.2)
Sodium: 140 mmol/L (ref 134–144)
eGFR: 96 mL/min/{1.73_m2} (ref >59)

## 2022-06-08 LAB — CBC
Hematocrit: 48.3 % (ref 37.5–51.0)
Hemoglobin: 15.6 g/dL (ref 13.0–17.7)
MCH: 30.7 pg (ref 26.6–33.0)
MCHC: 32.3 g/dL (ref 31.5–35.7)
MCV: 95 fL (ref 79–97)
Platelets: 326 10*3/uL (ref 150–450)
RBC: 5.08 x10E6/uL (ref 4.14–5.80)
RDW: 12.7 % (ref 11.6–15.4)
WBC: 7 10*3/uL (ref 3.4–10.8)

## 2022-06-08 LAB — MAGNESIUM: Magnesium: 2.3 mg/dL (ref 1.6–2.3)

## 2022-06-11 DIAGNOSIS — R002 Palpitations: Secondary | ICD-10-CM | POA: Diagnosis not present

## 2022-06-19 DIAGNOSIS — F411 Generalized anxiety disorder: Secondary | ICD-10-CM | POA: Diagnosis not present

## 2022-06-19 DIAGNOSIS — F4312 Post-traumatic stress disorder, chronic: Secondary | ICD-10-CM | POA: Diagnosis not present

## 2022-06-19 DIAGNOSIS — F410 Panic disorder [episodic paroxysmal anxiety] without agoraphobia: Secondary | ICD-10-CM | POA: Diagnosis not present

## 2022-06-19 DIAGNOSIS — F321 Major depressive disorder, single episode, moderate: Secondary | ICD-10-CM | POA: Diagnosis not present

## 2022-07-09 ENCOUNTER — Ambulatory Visit (HOSPITAL_COMMUNITY)
Admission: RE | Admit: 2022-07-09 | Discharge: 2022-07-09 | Disposition: A | Discharge status: Home / Self Care | Payer: Medicaid Other | Source: Ambulatory Visit | Attending: Physician Assistant | Admitting: Physician Assistant

## 2022-07-09 DIAGNOSIS — Q248 Other specified congenital malformations of heart: Secondary | ICD-10-CM

## 2022-07-09 DIAGNOSIS — Q249 Congenital malformation of heart, unspecified: Secondary | ICD-10-CM | POA: Diagnosis present

## 2022-07-09 LAB — ECHOCARDIOGRAM COMPLETE
AR max vel: 1.14 cm2
AV Area VTI: 1.12 cm2
AV Area mean vel: 1.18 cm2
AV Mean grad: 22 mmHg
AV Peak grad: 43.6 mmHg
Ao pk vel: 3.3 m/s
Area-P 1/2: 1.98 cm2
S' Lateral: 2.8 cm

## 2022-07-09 NOTE — Progress Notes (Signed)
*  PRELIMINARY RESULTS* Echocardiogram 2D Echocardiogram has been performed.  Scott [name] 07/09/2022, 1:49 PM

## 2022-07-17 ENCOUNTER — Telehealth: Payer: Self-pay

## 2022-07-17 DIAGNOSIS — Q249 Congenital malformation of heart, unspecified: Secondary | ICD-10-CM

## 2022-07-17 NOTE — Telephone Encounter (Signed)
The patient has been notified of the result and verbalized understanding.  All questions (if any) were answered. Gershon Crane, LPN [id number] AM

## 2022-07-19 NOTE — Progress Notes (Addendum)
Office Visit    Patient Name: Scott [name] Date of Encounter: 08/28/2022  PCP:  Donell Beers, FNP   [facility]  Cardiologist:  Dietrich Pates, MD  Advanced Practice Provider:  No care team member to display Electrophysiologist:  None   HPI    Scott [name] is a 47 y.o. 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 Fransico Michael ([facility]) and [facility], cardiac catheterization presurgery in 2020 showed no significant CAD presents today for follow-up appointment.  Seen January 2022 Fransico Michael) with some left-sided chest pain at that time.  Complained of some dyspnea.  Metoprolol was cut back.  High-resolution CT ordered without any significant abnormalities.  Pulmonary appointment was arranged.  He was seen by Dr. Tenny Craw September 2022 and had run out of Coumadin at that time.  Complained of intermittent chest pain.    Last seen 07/05/2021 by Dr. Tenny Craw and patient states that he gets "gassed" whenever he tries to do things.  Endorsed some shortness of breath.  Denied chest pain, dizziness, palpitations.  He was back on his Coumadin.  He needed to have dental work done at that time.  He was seen by me 06/07/22, he tells me that he went back to work 3 months ago and when he started working he does not take a lunch.  He has palpitations and shortness of breath.  He runs a [employer] and his job requires lots of heavy lifting.  He has not had his INR checked since November.  He has 5 kids that keep him really busy.  He also ran out of his HCTZ and has noticed some swelling in his hands.  Today, he stil is having a lot of symptoms.  He states he is back to work but is exhausted at the end of the workday.  He does a lot of manual labor. He  has 5 kids and does not have the energy at night to play with them or do much other than go to bed. He has lost a lot of weight (around 30 lbs) and cannot gain it back. He has not done much physical activity. He states he will get lightheaded and dizzy at times. His left arm goes numb and he has had some intermittent chest pain. There is a big difference between his blood pressures in his arms, but he says this is usual for him due to his coarctation of his aorta. Unclear if this was repaired back in 2020. Per the records it was but family states it was not. Since his surgery was done at [facility], I cannot pull the op note.   No orthopnea, PND. Reports no palpitations.    Past Medical History    Past Medical History:  Diagnosis Date   Anxiety    Aortic stenosis    Murmur, cardiac    Pancreatitis, acute 01/28/2017   Past Surgical History:  Procedure Laterality Date   AORTIC VALVE REPAIR     when pt was 47 years old   CARDIAC SURGERY     FINGER SURGERY     MECHANICAL AORTIC VALVE REPLACEMENT     pt has card with mechanical valve replacemnt details   RIGHT/LEFT HEART CATH AND CORONARY ANGIOGRAPHY Bilateral 09/17/2018   Procedure: RIGHT/LEFT HEART CATH AND  CORONARY ANGIOGRAPHY;  Surgeon: Marcina Millard, MD;  Location: [facility] INVASIVE CV LAB;  Service: Cardiovascular;  Laterality: Bilateral;    Allergies  Allergies  Allergen Reactions   [place] Hives    Pt sts he is allergic to [facility]     EKGs/Labs/Other Studies Reviewed:   The following studies were reviewed today: Echo   06/28/21   Left ventricular ejection fraction, by estimation, is 65 to 70%. The left ventricle has normal function. The left ventricle has no regional wall motion abnormalities. There is mild left ventricular hypertrophy. Left ventricular diastolic parameters were normal. 1. Right ventricular systolic function is normal. The right ventricular size is normal. There is normal pulmonary artery systolic pressure.  The estimated right ventricular systolic pressure is 21.3 mmHg. 2. 3. The mitral valve is grossly normal. Trivial mitral valve regurgitation. The aortic valve has been repaired/replaced. Aortic valve regurgitation is trivial. There is a 21 mm [facility] Jude mechanical valve present in the aortic position. Procedure Date: 11/03/18. Aortic valve mean gradient measures 17.2 mmHg. 4. 5. Aortic arch not well visualized. The inferior vena cava is normal in size with greater than 50% respiratory variability, suggesting right atrial pressure of 3 mmHg. 6. Comparison(s): Prior images unable to be directly viewed.  EKG:  EKG is  ordered today.  The ekg ordered today demonstrates old right bundle branch block, normal sinus rhythm rate 66 bpm  Recent Labs: 06/07/2022: Magnesium 2.3 08/26/2022: BUN 14; Creatinine, Ser 0.93; Hemoglobin 13.8; Platelets 248; Potassium 4.0; Sodium 138  Recent Lipid Panel    Component Value Date/Time   CHOL 132 06/23/2021 1055   TRIG 119 06/23/2021 1055   HDL 30 (L) 06/23/2021 1055   CHOLHDL 4.4 06/23/2021 1055   CHOLHDL 4.8 12/30/2020 1537   VLDL 14 12/30/2020 1537   LDLCALC 80 06/23/2021 1055     Home Medications   Current Meds  Medication Sig   acetaminophen (TYLENOL) 325 MG tablet Take 650 mg by mouth every 6 (six) hours as needed.   buPROPion (WELLBUTRIN XL) 150 MG 24 hr tablet Take 150 mg by mouth every morning.   cetirizine (ZYRTEC) 10 MG tablet Take 10 mg by mouth daily.   clonazePAM (KLONOPIN) 0.5 MG tablet Take 0.25-0.5 mg by mouth daily as needed.   hydrochlorothiazide (MICROZIDE) 12.5 MG capsule Take 1 capsule (12.5 mg total) by mouth daily.   lidocaine (LIDODERM) 5 % Place 1 patch onto the skin daily. Remove & Discard patch within 12 hours or as directed by MD   ondansetron (ZOFRAN-ODT) 4 MG disintegrating tablet Take by mouth.   warfarin (COUMADIN) 10 MG tablet Take 1 1/2 tablets daily as directed   [DISCONTINUED] albuterol (VENTOLIN HFA) 108 (90  Base) MCG/ACT inhaler Inhale into the lungs.   [DISCONTINUED] amoxicillin-clavulanate (AUGMENTIN) 875-125 MG tablet Take 1 tablet by mouth every 12 (twelve) hours.   [DISCONTINUED] cyclobenzaprine (FLEXERIL) 10 MG tablet Take 1 tablet (10 mg total) by mouth 3 (three) times daily as needed for muscle spasms.   [DISCONTINUED] metoprolol succinate (TOPROL-XL) 25 MG 24 hr tablet Take 1 tablet (25 mg total) by mouth daily.   [DISCONTINUED] rosuvastatin (CRESTOR) 10 MG tablet Take 1 tablet (10 mg total) by mouth daily.   [DISCONTINUED] tiZANidine (ZANAFLEX) 4 MG capsule Take 1 capsule (4 mg total) by mouth 3 (three) times daily as needed for muscle spasms.     Review of Systems      All other systems reviewed and are otherwise negative except as noted above.  Physical Exam    VS:  BP 120/60 Comment: Left arm 90/70  Pulse 64   Ht 5\' 11"  (1.803 m)   Wt 159 lb 9.6 oz (72.4 kg)   SpO2 98%   BMI 22.26 kg/m  , BMI Body mass index is 22.26 kg/m.  Wt Readings from Last 3 Encounters:  08/26/22 159 lb 9.8 oz (72.4 kg)  07/20/22 159 lb 9.6 oz (72.4 kg)  06/07/22 155 lb 6.4 oz (70.5 kg)     GEN: Well nourished, well developed, in no acute distress. HEENT: normal. Neck: Supple, no JVD, carotid bruits, or masses. Cardiac: RRR, no murmurs, rubs, or gallops. No clubbing, cyanosis, edema.  Radials/PT 2+ and equal bilaterally.  Respiratory:  Respirations regular and unlabored, clear to auscultation bilaterally. GI: Soft, nontender, nondistended. MS: No deformity or atrophy. Skin: Warm and dry, no rash. Neuro:  Strength and sensation are intact. Psych: Normal affect.  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 Tenny Craw aware  -blood pressure difference in his arms: 120/60 (r) and 90/70 (l) this has been the case since surgery in  2020  Dyspnea/fatigue/lightheaded/chest pain -decrease metoprolol for more BP room, HR 64 -monitor negative (07/10/22) -will order carotid US -labs ordered including an INR -refill medications -discussed proper hydration -encourage follow-up with [facility]  Hyperlipidemia -LDL  80, triglycerides 119 -due for repeat labs the next time he comes in -continue current medications  Tobacco abuse -cessation advised  ADDENDUM: Discussed case with Dr. Sherryll Burger, peer-to-peer cardiologist. Patient is SOB with activity and is ambulatory. We have canceled the [facility] and would like to assess the patient's hemodynamics with exercise and stress echo would be the most appropriate test for that evaluation. Mean gradient has increased on recent echo from March 2024  ( to ). Peak gradient was measured at 43.6 mmHg. We will await appeal. Our office can provide documentation that the Lexiscan Myoview was never preformed if needed.     Disposition: Follow up 6 weeks with Dietrich Pates, MD or APP.  Signed, Sharlene Dory, PA-C 08/28/2022, 4:22 PM [facility]

## 2022-07-20 ENCOUNTER — Ambulatory Visit: Payer: Medicaid Other | Attending: Physician Assistant | Admitting: Physician Assistant

## 2022-07-20 ENCOUNTER — Encounter: Payer: Self-pay | Admitting: Physician Assistant

## 2022-07-20 ENCOUNTER — Telehealth (HOSPITAL_COMMUNITY): Payer: Self-pay | Admitting: *Deleted

## 2022-07-20 VITALS — BP 120/60 | HR 64 | Ht 71.0 in | Wt 159.6 lb

## 2022-07-20 DIAGNOSIS — Z952 Presence of prosthetic heart valve: Secondary | ICD-10-CM | POA: Diagnosis not present

## 2022-07-20 DIAGNOSIS — E785 Hyperlipidemia, unspecified: Secondary | ICD-10-CM | POA: Diagnosis not present

## 2022-07-20 DIAGNOSIS — Z720 Tobacco use: Secondary | ICD-10-CM | POA: Diagnosis not present

## 2022-07-20 DIAGNOSIS — R42 Dizziness and giddiness: Secondary | ICD-10-CM

## 2022-07-20 DIAGNOSIS — R079 Chest pain, unspecified: Secondary | ICD-10-CM | POA: Diagnosis not present

## 2022-07-20 DIAGNOSIS — R0609 Other forms of dyspnea: Secondary | ICD-10-CM | POA: Diagnosis not present

## 2022-07-20 DIAGNOSIS — R55 Syncope and collapse: Secondary | ICD-10-CM

## 2022-07-20 MED ORDER — ROSUVASTATIN CALCIUM 10 MG PO TABS: 10.0000 mg | ORAL_TABLET | Freq: Every day | ORAL | 3 refills | Status: DC

## 2022-07-20 MED ORDER — METOPROLOL SUCCINATE ER 25 MG PO TB24: 12.5000 mg | ORAL_TABLET | Freq: Every day | ORAL | 3 refills | Status: DC

## 2022-07-20 NOTE — Telephone Encounter (Signed)
Left message on voicemail per DPR in reference to upcoming appointment scheduled on 07/25/22 at 10:45 with detailed instructions given per Myocardial Perfusion Study Information Sheet for the test. LM to arrive 15 minutes early, and that it is imperative to arrive on time for appointment to keep from having the test rescheduled. If you need to cancel or reschedule your appointment, please call the office within 24 hours of your appointment. Failure to do so may result in a cancellation of your appointment, and a $50 no show fee. Phone number given for call back for any questions.

## 2022-07-20 NOTE — Patient Instructions (Addendum)
Medication Instructions:  Decrease Metoprolol to 12.5 mg daily   *If you need a refill on your cardiac medications before your next appointment, please call your pharmacy*   Lab Work: None ordered   If you have labs (blood work) drawn today and your tests are completely normal, you will receive your results only by: MyChart Message (if you have MyChart) OR A paper copy in the mail If you have any lab test that is abnormal or we need to change your treatment, we will call you to review the results.   Testing/Procedures: Your physician has requested that you have a lexiscan myoview. For further information please visit [email or web address] Please follow instruction sheet, as given.   Your physician has requested that you have a carotid duplex. This test is an ultrasound of the carotid arteries in your neck. It looks at blood flow through these arteries that supply the brain with blood. Allow one hour for this exam. There are no restrictions or special instructions.    Follow-Up: At [facility], you and your health needs are our priority.  As part of our continuing mission to provide you with exceptional heart care, we have created designated Provider Care Teams.  These Care Teams include your primary Cardiologist (physician) and Advanced Practice Providers (APPs -  Physician Assistants and Nurse Practitioners) who all work together to provide you with the care you need, when you need it.  We recommend signing up for the patient portal called "MyChart".  Sign up information is provided on this After Visit Summary.  MyChart is used to connect with patients for Virtual Visits (Telemedicine).  Patients are able to view lab/test results, encounter notes, upcoming appointments, etc.  Non-urgent messages can be sent to your provider as well.   To learn more about what you can do with MyChart, go to [email or web address].    Your next appointment:   6 week(s)  Provider:   Dietrich Pates, MD  or Jari Favre PA-C   Other Instructions Check your blood pressure daily for 2 weeks and call our office or send [place] a Mychart message with your readings   Follow up with [facility] in regards to questions about your procedure    Low-Sodium Eating Plan Sodium, which is an element that makes up salt, helps you maintain a healthy balance of fluids in your body. Too much sodium can increase your blood pressure and cause fluid and waste to be held in your body. Your health care provider or dietitian may recommend following this plan if you have high blood pressure (hypertension), kidney disease, liver disease, or heart failure. Eating less sodium can help lower your blood pressure, reduce swelling, and protect your heart, liver, and kidneys. What are tips for following this plan? Reading food labels The Nutrition Facts label lists the amount of sodium in one serving of the food. If you eat more than one serving, you must multiply the listed amount of sodium by the number of servings. Choose foods with less than 140 mg of sodium per serving. Avoid foods with 300 mg of sodium or more per serving. Shopping  Look for lower-sodium products, often labeled as "low-sodium" or "no salt added." Always check the sodium content, even if foods are labeled as "unsalted" or "no salt added." Buy fresh foods. Avoid canned foods and pre-made or frozen meals. Avoid canned, cured, or processed meats. Buy breads that have less than 80 mg of sodium per slice. Cooking  Eat more home-cooked food and less  restaurant, buffet, and fast food. Avoid adding salt when cooking. Use salt-free seasonings or herbs instead of table salt or sea salt. Check with your health care provider or pharmacist before using salt substitutes. Cook with plant-based oils, such as canola, sunflower, or olive oil. Meal planning When eating at a restaurant, ask that your food be prepared with less salt or no salt, if possible. Avoid dishes  labeled as brined, pickled, cured, smoked, or made with soy sauce, miso, or teriyaki sauce. Avoid foods that contain MSG (monosodium glutamate). MSG is sometimes added to [place] food, bouillon, and some canned foods. Make meals that can be grilled, baked, poached, roasted, or steamed. These are generally made with less sodium. General information Most people on this plan should limit their sodium intake to 1,500-2,000 mg (milligrams) of sodium each day. What foods should I eat? Fruits Fresh, frozen, or canned fruit. Fruit juice. Vegetables Fresh or frozen vegetables. "No salt added" canned vegetables. "No salt added" tomato sauce and paste. Low-sodium or reduced-sodium tomato and vegetable juice. Grains Low-sodium cereals, including oats, puffed wheat and rice, and shredded wheat. Low-sodium crackers. Unsalted rice. Unsalted pasta. Low-sodium bread. Whole-grain breads and whole-grain pasta. Meats and other proteins Fresh or frozen (no salt added) meat, poultry, seafood, and fish. Low-sodium canned tuna and salmon. Unsalted nuts. Dried peas, beans, and lentils without added salt. Unsalted canned beans. Eggs. Unsalted nut butters. Dairy Milk. Soy milk. Cheese that is naturally low in sodium, such as ricotta cheese, fresh mozzarella, or Swiss cheese. Low-sodium or reduced-sodium cheese. Cream cheese. Yogurt. Seasonings and condiments Fresh and dried herbs and spices. Salt-free seasonings. Low-sodium mustard and ketchup. Sodium-free salad dressing. Sodium-free light mayonnaise. Fresh or refrigerated horseradish. Lemon juice. Vinegar. Other foods Homemade, reduced-sodium, or low-sodium soups. Unsalted popcorn and pretzels. Low-salt or salt-free chips. The items listed above may not be a complete list of foods and beverages you can eat. Contact a dietitian for more information. What foods should I avoid? Vegetables Sauerkraut, pickled vegetables, and relishes. Olives. [place] fries. Onion rings.  Regular canned vegetables (not low-sodium or reduced-sodium). Regular canned tomato sauce and paste (not low-sodium or reduced-sodium). Regular tomato and vegetable juice (not low-sodium or reduced-sodium). Frozen vegetables in sauces. Grains Instant hot cereals. Bread stuffing, pancake, and biscuit mixes. Croutons. Seasoned rice or pasta mixes. Noodle soup cups. Boxed or frozen macaroni and cheese. Regular salted crackers. Self-rising flour. Meats and other proteins Meat or fish that is salted, canned, smoked, spiced, or pickled. Precooked or cured meat, such as sausages or meat loaves. Tomasa Blase. Ham. Pepperoni. Hot dogs. Corned beef. Chipped beef. Salt pork. Jerky. Pickled herring. Anchovies and sardines. Regular canned tuna. Salted nuts. Dairy Processed cheese and cheese spreads. Hard cheeses. Cheese curds. Blue cheese. Feta cheese. String cheese. Regular cottage cheese. Buttermilk. Canned milk. Fats and oils Salted butter. Regular margarine. Ghee. Bacon fat. Seasonings and condiments Onion salt, garlic salt, seasoned salt, table salt, and sea salt. Canned and packaged gravies. Worcestershire sauce. Tartar sauce. Barbecue sauce. Teriyaki sauce. Soy sauce, including reduced-sodium. Steak sauce. Fish sauce. Oyster sauce. Cocktail sauce. Horseradish that you find on the shelf. Regular ketchup and mustard. Meat flavorings and tenderizers. Bouillon cubes. Hot sauce. Pre-made or packaged marinades. Pre-made or packaged taco seasonings. Relishes. Regular salad dressings. Salsa. Other foods Salted popcorn and pretzels. Corn chips and puffs. Potato and tortilla chips. Canned or dried soups. Pizza. Frozen entrees and pot pies. The items listed above may not be a complete list of foods and beverages you should  avoid. Contact a dietitian for more information. Summary Eating less sodium can help lower your blood pressure, reduce swelling, and protect your heart, liver, and kidneys. Most people on this plan should  limit their sodium intake to 1,500-2,000 mg (milligrams) of sodium each day. Canned, boxed, and frozen foods are high in sodium. Restaurant foods, fast foods, and pizza are also very high in sodium. You also get sodium by adding salt to food. Try to cook at home, eat more fresh fruits and vegetables, and eat less fast food and canned, processed, or prepared foods. This information is not intended to replace advice given to you by your health care provider. Make sure you discuss any questions you have with your health care provider. Document Revised: 03/09/2019 Document Reviewed: 03/04/2019 Elsevier Patient Education  2023 Elsevier Inc.  Heart-Healthy Eating Plan Eating a healthy diet is important for the health of your heart. A heart-healthy eating plan includes: Eating less unhealthy fats. Eating more healthy fats. Eating less salt in your food. Salt is also called sodium. Making other changes in your diet. Talk with your doctor or a diet specialist (dietitian) to create an eating plan that is right for you. What is my plan? Your doctor may recommend an eating plan that includes: Total fat: ______% or less of total calories a day. Saturated fat: ______% or less of total calories a day. Cholesterol: less than _________mg a day. Sodium: less than _________mg a day. What are tips for following this plan? Cooking Avoid frying your food. Try to bake, boil, grill, or broil it instead. You can also reduce fat by: Removing the skin from poultry. Removing all visible fats from meats. Steaming vegetables in water or broth. Meal planning  At meals, divide your plate into four equal parts: Fill one-half of your plate with vegetables and green salads. Fill one-fourth of your plate with whole grains. Fill one-fourth of your plate with lean protein foods. Eat 2-4 cups of vegetables per day. One cup of vegetables is: 1 cup (91 g) broccoli or cauliflower florets. 2 medium carrots. 1 large bell  pepper. 1 large sweet potato. 1 large tomato. 1 medium white potato. 2 cups (150 g) raw leafy greens. Eat 1-2 cups of fruit per day. One cup of fruit is: 1 small apple 1 large banana 1 cup (237 g) mixed fruit, 1 large orange,  cup (82 g) dried fruit, 1 cup (240 mL) 100% fruit juice. Eat more foods that have soluble fiber. These are apples, broccoli, carrots, beans, peas, and barley. Try to get 20-30 g of fiber per day. Eat 4-5 servings of nuts, legumes, and seeds per week: 1 serving of dried beans or legumes equals  cup (90 g) cooked. 1 serving of nuts is  oz (12 almonds, 24 pistachios, or 7 walnut halves). 1 serving of seeds equals  oz (8 g). General information Eat more home-cooked food. Eat less restaurant, buffet, and fast food. Limit or avoid alcohol. Limit foods that are high in starch and sugar. Avoid fried foods. Lose weight if you are overweight. Keep track of how much salt (sodium) you eat. This is important if you have high blood pressure. Ask your doctor to tell you more about this. Try to add vegetarian meals each week. Fats Choose healthy fats. These include olive oil and canola oil, flaxseeds, walnuts, almonds, and seeds. Eat more omega-3 fats. These include salmon, mackerel, sardines, tuna, flaxseed oil, and ground flaxseeds. Try to eat fish at least 2 times each week.  Check food labels. Avoid foods with trans fats or high amounts of saturated fat. Limit saturated fats. These are often found in animal products, such as meats, butter, and cream. These are also found in plant foods, such as palm oil, palm kernel oil, and coconut oil. Avoid foods with partially hydrogenated oils in them. These have trans fats. Examples are stick margarine, some tub margarines, cookies, crackers, and other baked goods. What foods should I eat? Fruits All fresh, canned (in natural juice), or frozen fruits. Vegetables Fresh or frozen vegetables (raw, steamed, roasted, or grilled).  Green salads. Grains Most grains. Choose whole wheat and whole grains most of the time. Rice and pasta, including brown rice and pastas made with whole wheat. Meats and other proteins Lean, well-trimmed beef, veal, pork, and lamb. Chicken and [place] without skin. All fish and shellfish. Wild duck, rabbit, pheasant, and venison. Egg whites or low-cholesterol egg substitutes. Dried beans, peas, lentils, and tofu. Seeds and most nuts. Dairy Low-fat or nonfat cheeses, including ricotta and mozzarella. Skim or 1% milk that is liquid, powdered, or evaporated. Buttermilk that is made with low-fat milk. Nonfat or low-fat yogurt. Fats and oils Non-hydrogenated (trans-free) margarines. Vegetable oils, including soybean, sesame, sunflower, olive, peanut, safflower, corn, canola, and cottonseed. Salad dressings or mayonnaise made with a vegetable oil. Beverages Mineral water. Coffee and tea. Diet carbonated beverages. Sweets and desserts Sherbet, gelatin, and fruit ice. Small amounts of dark chocolate. Limit all sweets and desserts. Seasonings and condiments All seasonings and condiments. The items listed above may not be a complete list of foods and drinks you can eat. Contact a dietitian for more options. What foods should I avoid? Fruits Canned fruit in heavy syrup. Fruit in cream or butter sauce. Fried fruit. Limit coconut. Vegetables Vegetables cooked in cheese, cream, or butter sauce. Fried vegetables. Grains Breads that are made with saturated or trans fats, oils, or whole milk. Croissants. Sweet rolls. Donuts. High-fat crackers, such as cheese crackers. Meats and other proteins Fatty meats, such as hot dogs, ribs, sausage, bacon, rib-eye roast or steak. High-fat deli meats, such as salami and bologna. Caviar. Domestic duck and goose. Organ meats, such as liver. Dairy Cream, sour cream, cream cheese, and creamed cottage cheese. Whole-milk cheeses. Whole or 2% milk that is liquid, evaporated, or  condensed. Whole buttermilk. Cream sauce or high-fat cheese sauce. Yogurt that is made from whole milk. Fats and oils Meat fat, or shortening. Cocoa butter, hydrogenated oils, palm oil, coconut oil, palm kernel oil. Solid fats and shortenings, including bacon fat, salt pork, lard, and butter. Nondairy cream substitutes. Salad dressings with cheese or sour cream. Beverages Regular sodas and juice drinks with added sugar. Sweets and desserts Frosting. Pudding. Cookies. Cakes. Pies. Milk chocolate or white chocolate. Buttered syrups. Full-fat ice cream or ice cream drinks. The items listed above may not be a complete list of foods and drinks to avoid. Contact a dietitian for more information. Summary Heart-healthy meal planning includes eating less unhealthy fats, eating more healthy fats, and making other changes in your diet. Eat a balanced diet. This includes fruits and vegetables, low-fat or nonfat dairy, lean protein, nuts and legumes, whole grains, and heart-healthy oils and fats. This information is not intended to replace advice given to you by your health care provider. Make sure you discuss any questions you have with your health care provider. Document Revised: 05/08/2021 Document Reviewed: 05/08/2021 Elsevier Patient Education  2023 [employer]

## 2022-07-25 ENCOUNTER — Encounter (HOSPITAL_COMMUNITY): Payer: Medicaid Other

## 2022-07-28 IMAGING — DX DG CERVICAL SPINE 2 OR 3 VIEWS
4 series · 4 of 4 positions shown · non-contrast
Comparison: None.

CLINICAL DATA: Neck pain

EXAM:
CERVICAL SPINE - 2-3 VIEW

[c-spine lat]
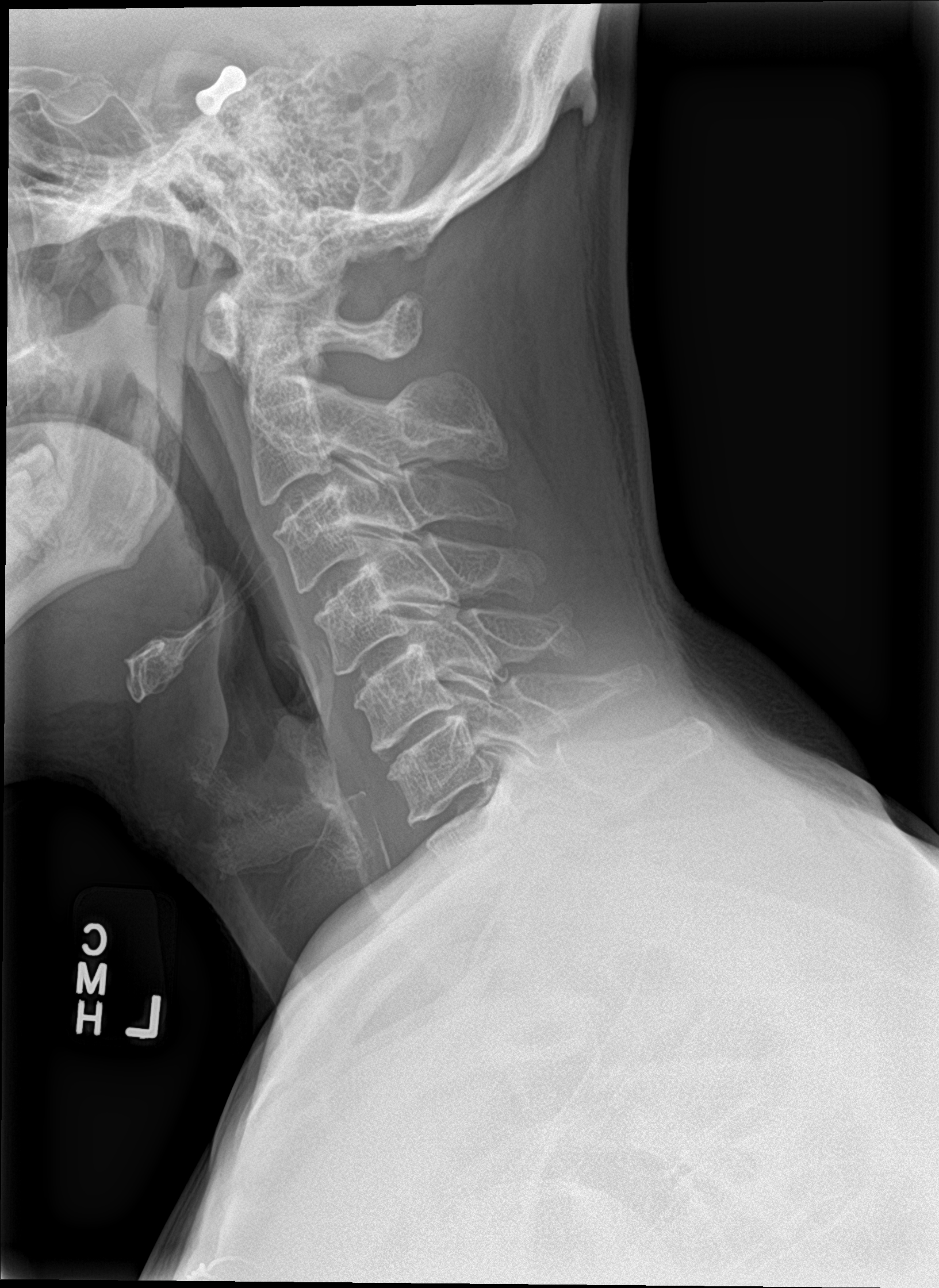

[c-spine ap]
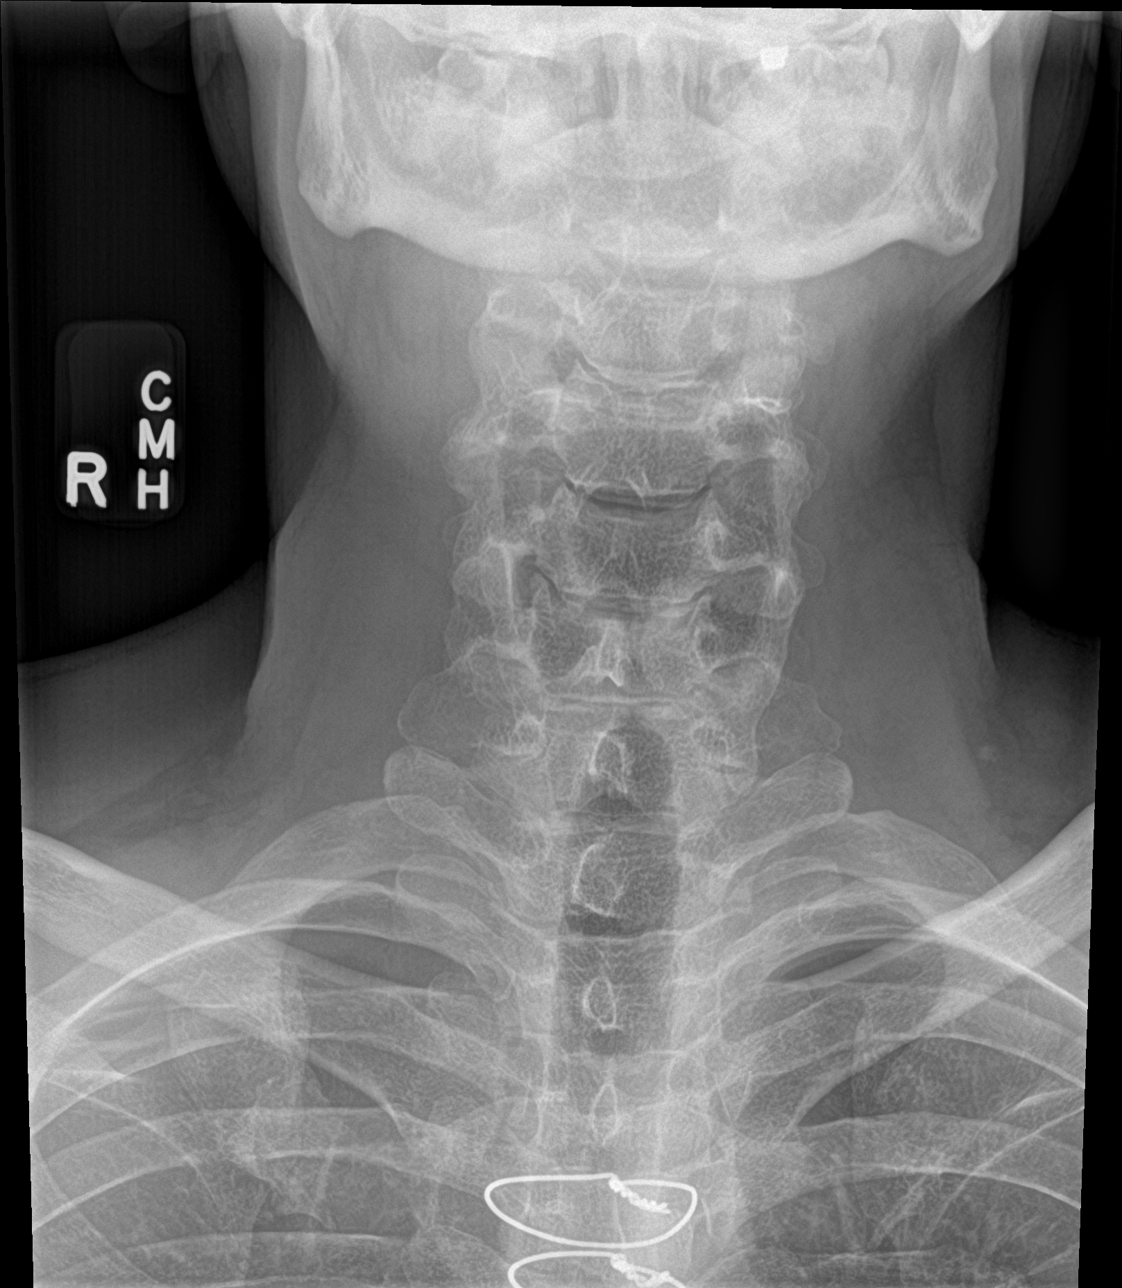

[c-spine open mouth]
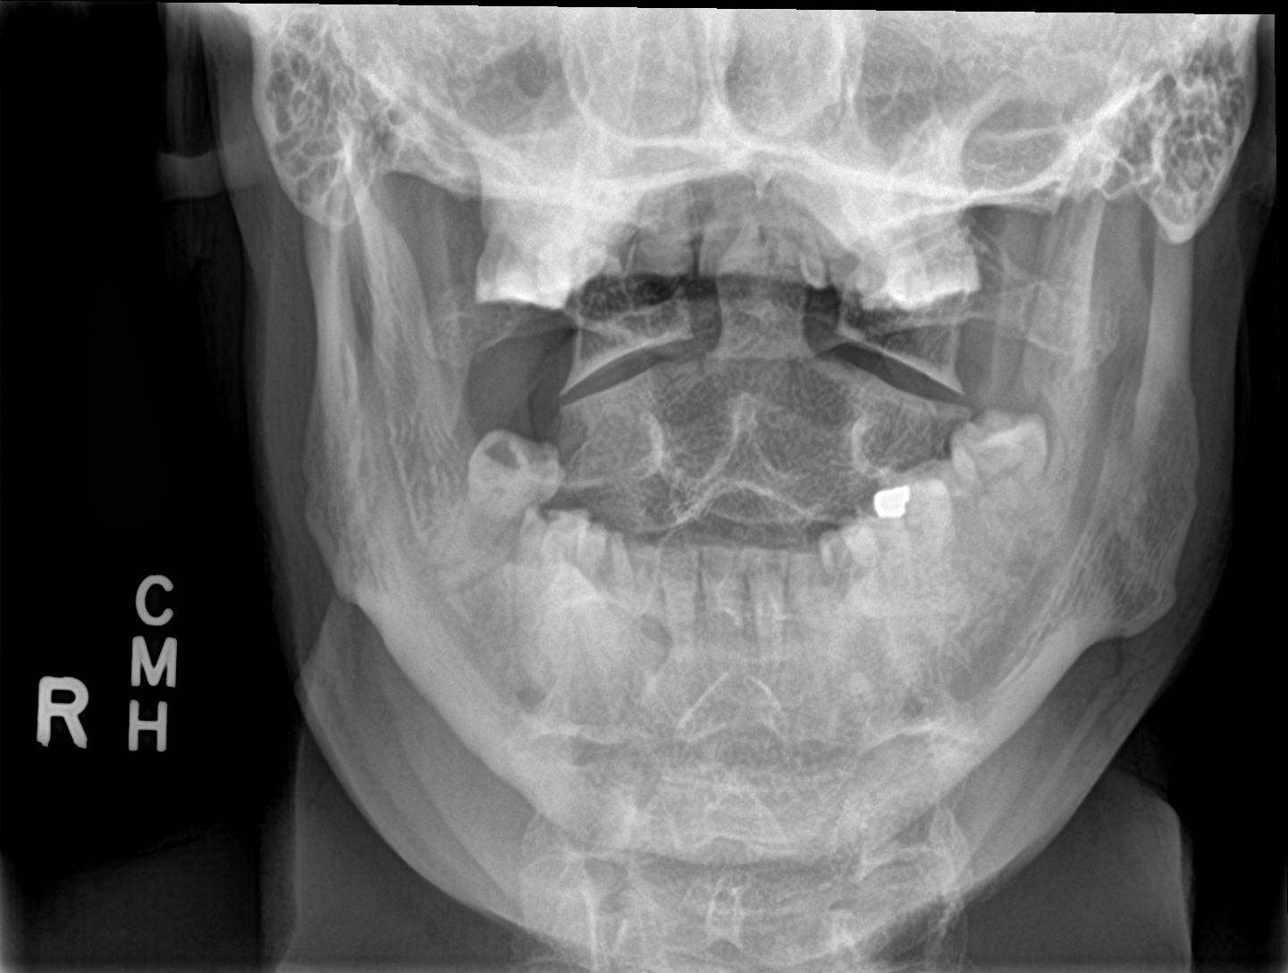

[c-spine swimmers trauma]
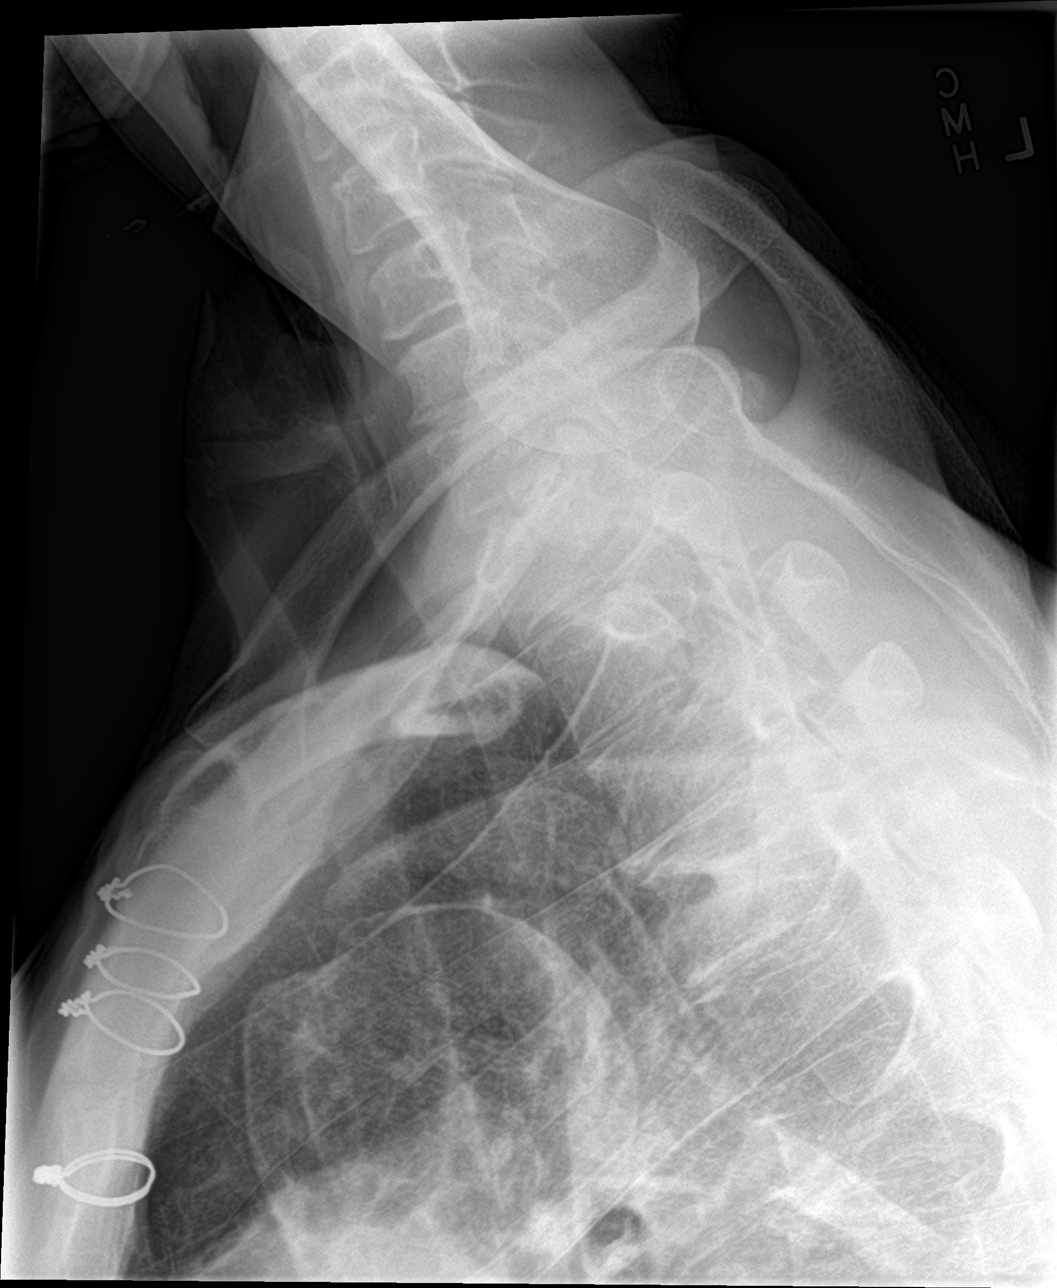

[4 of 4 positions shown; findings below may reference images not displayed]

FINDINGS: There is no evidence of cervical spine fracture or prevertebral soft
tissue swelling. Mild degenerative disc disease. Alignment is
normal. No other significant bone abnormalities are identified.
IMPRESSION: Mild degenerative disc disease.

## 2022-07-28 NOTE — Progress Notes (Signed)
I wouldn't schedule a myoview He had normal coronary arteries in 2020  I would consider a stress echo to see his exercise capacity and to see what his gradients do with walking ALso, keep follow up with [facility]

## 2022-07-30 ENCOUNTER — Other Ambulatory Visit: Payer: Self-pay | Admitting: *Deleted

## 2022-08-01 ENCOUNTER — Other Ambulatory Visit: Payer: Self-pay | Admitting: *Deleted

## 2022-08-01 NOTE — Progress Notes (Signed)
Stress with pharmacologic or treadmill with exercise?

## 2022-08-01 NOTE — Progress Notes (Signed)
So nuclear treadmill instead of stress echo? When I put in the order for stress echo, it asked stress with pharmacologic or treadmill with exercise and I did not know which to click on.

## 2022-08-03 ENCOUNTER — Other Ambulatory Visit: Payer: Self-pay | Admitting: *Deleted

## 2022-08-03 ENCOUNTER — Other Ambulatory Visit: Payer: Self-pay | Admitting: Physician Assistant

## 2022-08-03 DIAGNOSIS — R5383 Other fatigue: Secondary | ICD-10-CM

## 2022-08-03 DIAGNOSIS — R42 Dizziness and giddiness: Secondary | ICD-10-CM

## 2022-08-03 NOTE — Progress Notes (Signed)
Spoke with patient and discussed ordering a stress echo per Julian Hy and he agreed. Order placed and he is aware that he will receive a call to schedule.

## 2022-08-16 ENCOUNTER — Encounter (HOSPITAL_BASED_OUTPATIENT_CLINIC_OR_DEPARTMENT_OTHER): Payer: Medicaid Other

## 2022-08-17 ENCOUNTER — Ambulatory Visit (HOSPITAL_COMMUNITY)
Admission: RE | Admit: 2022-08-17 | Discharge: 2022-08-17 | Disposition: A | Discharge status: Home / Self Care | Payer: Medicaid Other | Source: Ambulatory Visit | Attending: Cardiovascular Disease | Admitting: Cardiovascular Disease

## 2022-08-17 DIAGNOSIS — R55 Syncope and collapse: Secondary | ICD-10-CM | POA: Diagnosis not present

## 2022-08-20 ENCOUNTER — Other Ambulatory Visit: Payer: Self-pay | Admitting: *Deleted

## 2022-08-21 ENCOUNTER — Other Ambulatory Visit: Payer: Self-pay | Admitting: *Deleted

## 2022-08-21 DIAGNOSIS — I779 Disorder of arteries and arterioles, unspecified: Secondary | ICD-10-CM

## 2022-08-21 DIAGNOSIS — I726 Aneurysm of vertebral artery: Secondary | ICD-10-CM

## 2022-08-22 ENCOUNTER — Telehealth (HOSPITAL_COMMUNITY): Payer: Self-pay | Admitting: *Deleted

## 2022-08-22 ENCOUNTER — Other Ambulatory Visit: Payer: Self-pay | Admitting: *Deleted

## 2022-08-22 NOTE — Telephone Encounter (Signed)
Left message on voicemail per DPR in reference to upcoming appointment scheduled on 08/27/2022 at 2:30 with detailed instructions given per Myocardial Perfusion Study Information Sheet for the test. LM to arrive 15 minutes early, and that it is imperative to arrive on time for appointment to keep from having the test rescheduled. If you need to cancel or reschedule your appointment, please call the office within 24 hours of your appointment. Failure to do so may result in a cancellation of your appointment, and a $50 no show fee. Phone number given for call back for any questions.

## 2022-08-24 DIAGNOSIS — J019 Acute sinusitis, unspecified: Secondary | ICD-10-CM | POA: Diagnosis not present

## 2022-08-26 ENCOUNTER — Encounter (HOSPITAL_COMMUNITY): Payer: Self-pay | Admitting: Emergency Medicine

## 2022-08-26 ENCOUNTER — Emergency Department (HOSPITAL_COMMUNITY)
Admission: EM | Admit: 2022-08-26 | Discharge: 2022-08-27 | Disposition: A | Discharge status: Home / Self Care | Payer: Medicaid Other | Attending: Emergency Medicine | Admitting: Emergency Medicine

## 2022-08-26 ENCOUNTER — Emergency Department (HOSPITAL_COMMUNITY): Payer: Medicaid Other

## 2022-08-26 ENCOUNTER — Other Ambulatory Visit: Payer: Self-pay

## 2022-08-26 DIAGNOSIS — Z7901 Long term (current) use of anticoagulants: Secondary | ICD-10-CM | POA: Insufficient documentation

## 2022-08-26 DIAGNOSIS — R791 Abnormal coagulation profile: Secondary | ICD-10-CM | POA: Diagnosis not present

## 2022-08-26 DIAGNOSIS — J439 Emphysema, unspecified: Secondary | ICD-10-CM | POA: Diagnosis not present

## 2022-08-26 DIAGNOSIS — M542 Cervicalgia: Secondary | ICD-10-CM | POA: Diagnosis not present

## 2022-08-26 DIAGNOSIS — R519 Headache, unspecified: Secondary | ICD-10-CM | POA: Insufficient documentation

## 2022-08-26 LAB — CBC WITH DIFFERENTIAL/PLATELET
Abs Immature Granulocytes: 0.03 10*3/uL (ref 0.00–0.07)
Basophils Absolute: 0.1 10*3/uL (ref 0.0–0.1)
Basophils Relative: 1 %
Eosinophils Absolute: 0.4 10*3/uL (ref 0.0–0.5)
Eosinophils Relative: 4 %
HCT: 41.4 % (ref 39.0–52.0)
Hemoglobin: 13.8 g/dL (ref 13.0–17.0)
Immature Granulocytes: 0 %
Lymphocytes Relative: 29 %
Lymphs Abs: 3 10*3/uL (ref 0.7–4.0)
MCH: 31.4 pg (ref 26.0–34.0)
MCHC: 33.3 g/dL (ref 30.0–36.0)
MCV: 94.3 fL (ref 80.0–100.0)
Monocytes Absolute: 1 10*3/uL (ref 0.1–1.0)
Monocytes Relative: 10 %
Neutro Abs: 5.8 10*3/uL (ref 1.7–7.7)
Neutrophils Relative %: 56 %
Platelets: 248 10*3/uL (ref 150–400)
RBC: 4.39 MIL/uL (ref 4.22–5.81)
RDW: 13.2 % (ref 11.5–15.5)
WBC: 10.4 10*3/uL (ref 4.0–10.5)
nRBC: 0 % (ref 0.0–0.2)

## 2022-08-26 LAB — PROTIME-INR
INR: 1.1 (ref 0.8–1.2)
Prothrombin Time: 14 seconds (ref 11.4–15.2)

## 2022-08-26 LAB — BASIC METABOLIC PANEL
Anion gap: 7 (ref 5–15)
BUN: 14 mg/dL (ref 6–20)
CO2: 27 mmol/L (ref 22–32)
Calcium: 9.3 mg/dL (ref 8.9–10.3)
Chloride: 104 mmol/L (ref 98–111)
Creatinine, Ser: 0.93 mg/dL (ref 0.61–1.24)
GFR, Estimated: >60 mL/min (ref >60)
Glucose, Bld: 99 mg/dL (ref 70–99)
Potassium: 4 mmol/L (ref 3.5–5.1)
Sodium: 138 mmol/L (ref 135–145)

## 2022-08-26 MED ORDER — IOHEXOL 350 MG/ML SOLN
125.0000 mL | Freq: Once | INTRAVENOUS | Status: AC | PRN
  Administered 2022-08-26: 125 mL via INTRAVENOUS

## 2022-08-26 NOTE — ED Notes (Signed)
Patient transported to CT 

## 2022-08-26 NOTE — ED Triage Notes (Signed)
Pt c/o facial pain from sinus infection that he was dx with at [facility] on Friday. Pt states he has been unable to sleep for the past 3 days.   Pt also concerned due to taking ibuprofen he hasn't been taking his blood thinners. Pt currently has mechanical heart value.

## 2022-08-26 NOTE — ED Notes (Signed)
ED Provider at bedside. 

## 2022-08-26 NOTE — ED Provider Notes (Signed)
[place] EMERGENCY DEPARTMENT AT [facility] Provider Note   CSN: [id number] Arrival date & time: 08/26/22  2056     History  Chief Complaint  Patient presents with   Facial Pain    Scott [name] is a 47 y.o. male.  HPI Patient presents with left-sided facial pain.  Has had for around 3 days now.  Seen 2 days ago at urgent care and diagnosed with sinusitis and started on Augmentin.  Has been taking Tylenol.  Pain is on the left face inserted in the tooth area now goes up the cheek and down the lower jaw.  Reported not sleep.  Is on Coumadin for mechanical heart valve.  States he has been changing his Coumadin dose because he been taking the Tylenol.  Patient had some previous syncope episodes.  Has been seen by cardiology and had Doppler done of carotids and vertebrals.  Needs CT angiography to evaluate flow.   Past Medical History:  Diagnosis Date   Anxiety    Aortic stenosis    Murmur, cardiac    Pancreatitis, acute 01/28/2017    Home Medications Prior to Admission medications   Medication Sig Start Date End Date Taking? Authorizing Provider  acetaminophen (TYLENOL) 325 MG tablet Take 650 mg by mouth every 6 (six) hours as needed.    [provider]  buPROPion (WELLBUTRIN XL) 150 MG 24 hr tablet Take 150 mg by mouth every morning. 11/28/21   [provider]  cetirizine (ZYRTEC) 10 MG tablet Take 10 mg by mouth daily.    [provider]  clonazePAM (KLONOPIN) 0.5 MG tablet Take 0.25-0.5 mg by mouth daily as needed. 11/28/21   [provider]  hydrochlorothiazide (MICROZIDE) 12.5 MG capsule Take 1 capsule (12.5 mg total) by mouth daily. 06/07/22   Sharlene Dory, PA-C  lidocaine (LIDODERM) 5 % Place 1 patch onto the skin daily. Remove & Discard patch within 12 hours or as directed by MD 02/22/22   Particia Nearing, PA-C  metoprolol succinate (TOPROL-XL) 25 MG 24 hr tablet Take 0.5 tablets (12.5 mg total) by mouth daily. 07/20/22    Sharlene Dory, PA-C  ondansetron (ZOFRAN-ODT) 4 MG disintegrating tablet Take by mouth. 07/23/21   [provider]  rosuvastatin (CRESTOR) 10 MG tablet Take 1 tablet (10 mg total) by mouth daily. 07/20/22   Sharlene Dory, PA-C  warfarin (COUMADIN) 10 MG tablet Take 1 1/2 tablets daily as directed 11/16/21   [name], MD      Allergies    Cocoa    Review of Systems   Review of Systems  Physical Exam Updated Vital Signs BP (!) 159/84 (BP Location: Right Arm)   Pulse (!) 58   Temp (!) 96.6 F (35.9 C) (Temporal)   Resp 17   Ht 5\' 11"  (1.803 m)   Wt 72.4 kg   SpO2 100%   BMI 22.26 kg/m  Physical Exam Vitals and nursing note reviewed.  HENT:     Head: Normocephalic.     Mouth/Throat:     Comments: Tenderness of left cheek.  No swelling.  Tenderness over dentition on the left maxilla.  Eye movements intact without proptosis. Eyes:     Extraocular Movements: Extraocular movements intact.  Cardiovascular:     Rate and Rhythm: Regular rhythm.  Abdominal:     Tenderness: There is no abdominal tenderness.  Musculoskeletal:        General: No tenderness.  Skin:    General: Skin  is warm.     Capillary Refill: Capillary refill takes less than 2 seconds.  Neurological:     Mental Status: He is alert and oriented to person, place, and time.     ED Results / Procedures / Treatments   Labs (all labs ordered are listed, but only abnormal results are displayed) Labs Reviewed  PROTIME-INR  CBC WITH DIFFERENTIAL/PLATELET  BASIC METABOLIC PANEL    EKG None  Radiology No results found.  Procedures Procedures    Medications Ordered in ED Medications - No data to display  ED Course/ Medical Decision Making/ A&P                             Medical Decision Making Amount and/or Complexity of Data Reviewed Labs: ordered. Radiology: ordered.   Patient with facial pain.  On Augmentin.  Differential diagnosis includes dental pain, abscess, sinusitis.  With  anticoagulation and pain will get CT scan to evaluate.  Will get INR particularly since he has been adjusting his medicines.  Needs treatment because of his mechanical valve.  Also was due to have CT angiography of his neck and chest to evaluate some syncope and fluid abnormalities on Dopplers.  Will get these also since he is getting the contrast bolus.  Care turned over to Dr. Bernette Mayers.        Final Clinical Impression(s) / ED Diagnoses Final diagnoses:  None    Rx / DC Orders ED Discharge Orders     None         Benjiman Core, MD 08/26/22 2315

## 2022-08-27 ENCOUNTER — Encounter (HOSPITAL_COMMUNITY): Payer: Self-pay

## 2022-08-27 ENCOUNTER — Ambulatory Visit (HOSPITAL_COMMUNITY): Payer: Medicaid Other

## 2022-08-27 MED ORDER — ENOXAPARIN SODIUM 40 MG/0.4ML IJ SOSY: 75.0000 mg | PREFILLED_SYRINGE | Freq: Two times a day (BID) | INTRAMUSCULAR | 0 refills | Status: DC

## 2022-08-27 MED ORDER — ENOXAPARIN SODIUM 80 MG/0.8ML IJ SOSY
75.0000 mg | PREFILLED_SYRINGE | Freq: Once | INTRAMUSCULAR | Status: AC
  Administered 2022-08-27: 75 mg via SUBCUTANEOUS
  Filled 2022-08-27: qty 0.8

## 2022-08-27 MED ORDER — HYDROCODONE-ACETAMINOPHEN 5-325 MG PO TABS: 1.0000 | ORAL_TABLET | Freq: Four times a day (QID) | ORAL | 0 refills | Status: DC | PRN

## 2022-08-27 MED ORDER — HYDROCODONE-ACETAMINOPHEN 5-325 MG PO TABS
1.0000 | ORAL_TABLET | Freq: Once | ORAL | Status: AC
  Administered 2022-08-27: 1 via ORAL
  Filled 2022-08-27: qty 1

## 2022-08-27 NOTE — ED Provider Notes (Signed)
Care of the patient assumed at shift change. Here for facial pain, concern for sinusitis or dental abscess. Has been on Augmentin. Also has history of coarctation of aorta, s/p mechanical valve on coumadin. He had 'cut back' on the coumadin because he was taking ibuprofen. His INR today is subtherapeutic. He has had similar before, never had lovenox bridging. CT scan face is neg for acute process. CTA done as follow up for recent doppler study with cardiology is neg for any critical stenosis. Recommend he restart taking his usual dose of coumadin, begin lovenox bridge here and continue as outpatient for 3 days, recommend close outpatient follow up for INR check. Norco for pain.    Pollyann Savoy, MD 08/27/22 [phone]

## 2022-08-28 ENCOUNTER — Telehealth: Payer: Self-pay | Admitting: *Deleted

## 2022-08-28 NOTE — Transitions of Care (Post Inpatient/ED Visit) (Signed)
08/28/2022  Name: Scott [name] MRN: [id number] DOB: 22-Aug-1975  Today's TOC FU Call Status: Today's TOC FU Call Status:: Successful TOC FU Call Competed TOC FU Call Complete Date: 08/28/22  Transition Care Management Follow-up Telephone Call Date of Discharge: 08/27/22 [facility] Type of Discharge: Emergency Department Reason for ED Visit: Other: (facial pain) How have you been since you were released from the hospital?: Same Any questions or concerns?: No  Items Reviewed: Did you receive and understand the discharge instructions provided?: Yes Medications obtained,verified, and reconciled?: Yes (Medications Reviewed) Any new allergies since your discharge?: No Dietary orders reviewed?: NA Do you have support at home?: Yes People in Home: spouse Name of Support/Comfort Primary Source: Wife/Scott [name]  Medications Reviewed Today: Medications Reviewed Today     Reviewed by Scott Dach, RN (Registered Nurse) on 08/28/22 at 1143  Med List Status: <None>   Medication Order Taking? Sig Documenting Provider Last Dose Status Informant  acetaminophen (TYLENOL) 325 MG tablet [id number] Yes Take 650 mg by mouth every 6 (six) hours as needed. [provider] Taking Active   amoxicillin-clavulanate (AUGMENTIN) 875-125 MG tablet [id number] Yes Take 1 tablet by mouth 2 (two) times daily. [provider] Taking Active   buPROPion (WELLBUTRIN XL) 150 MG 24 hr tablet [id number] Yes Take 150 mg by mouth every morning. [provider] Taking Active   cetirizine (ZYRTEC) 10 MG tablet [id number] Yes Take 10 mg by mouth daily. [provider] Taking Active   clonazePAM (KLONOPIN) 0.5 MG tablet [id number] Yes Take 0.25-0.5 mg by mouth daily as needed. [provider] Taking Active   enoxaparin (LOVENOX) 40 MG/0.4ML injection [id number] No Inject 0.75 mLs (75 mg total) into the skin every 12 (twelve) hours for 3 days.  Patient not  taking: Reported on 08/28/2022   Scott Savoy, MD Not Taking Active            Med Note Ardelia Mems, Scott [name]   Tue Aug 28, 2022 11:42 AM) On backorder, will check today  hydrochlorothiazide (MICROZIDE) 12.5 MG capsule [id number] Yes Take 1 capsule (12.5 mg total) by mouth daily. Scott Dory, PA-C Taking Active   HYDROcodone-acetaminophen (NORCO/VICODIN) 5-325 MG tablet [id number] Yes Take 1 tablet by mouth every 6 (six) hours as needed for severe pain. Scott Savoy, MD Taking Active   lidocaine (LIDODERM) 5 % [id number] Yes Place 1 patch onto the skin daily. Remove & Discard patch within 12 hours or as directed by MD Scott Nearing, PA-C Taking Active   metoprolol succinate (TOPROL-XL) 25 MG 24 hr tablet [id number] Yes Take 0.5 tablets (12.5 mg total) by mouth daily. Scott Dory, PA-C Taking Active   ondansetron (ZOFRAN-ODT) 4 MG disintegrating tablet [id number] Yes Take by mouth. [provider] Taking Active   rosuvastatin (CRESTOR) 10 MG tablet [id number] Yes Take 1 tablet (10 mg total) by mouth daily. Scott [name], [place] Taking Active   warfarin (COUMADIN) 10 MG tablet [id number] Yes Take 1 1/2 tablets daily as directed [name], MD Taking Active             Home Care and Equipment/Supplies: Were Home Health Services Ordered?: NA Any new equipment or medical supplies ordered?: NA  Functional Questionnaire: Do you need assistance with bathing/showering or dressing?: No Do you need assistance with meal preparation?: No Do you need assistance with eating?: No Do you have difficulty maintaining continence: No Do you need assistance with getting out of bed/getting out of  [name] chair/moving?: No Do you have difficulty managing or taking your medications?: No  Follow up appointments reviewed: PCP Follow-up appointment confirmed?: NA Specialist Hospital Follow-up appointment confirmed?: NA Do you need transportation to your follow-up appointment?: No Do you  understand care options if your condition(s) worsen?: Yes-patient verbalized understanding  SDOH Interventions Today    Flowsheet Row Most Recent Value  SDOH Interventions   Transportation Interventions Intervention Not Indicated     RNCM advised patient to contact other pharmacies for lovenox availability.  Patient unsure if he should attend upcoming appointment with Scott Favre, PA on 08/31/22, due to not completing Stress Echo(insurance has not approved). RNCM messaged Scott [name] and will follow up with Scott [name].  The patient was given information about care management services as [name] benefit of their Medicaid health plan today.   Patient                                              did not agree to enrollment in care management services and does not wish to consider at this time.   Scott Emms RN, BSN [place]  [facility] RN Care Coordinator [phone]

## 2022-08-30 ENCOUNTER — Telehealth: Payer: Self-pay | Admitting: Internal Medicine

## 2022-08-30 NOTE — Telephone Encounter (Signed)
Patient states his insurance denied the echo and they sent a letter to the office and to him. He says the person he spoke with at his insurance company could not access the letter, but she told him the office would need to appeal it with the insurance company. He says he was told it may have been a simple error in the paperwork. He says he does not understand why he was contacted the day of his appointment that the insurance company denied the test. He requests a call back.

## 2022-08-31 ENCOUNTER — Ambulatory Visit: Payer: Medicaid Other | Admitting: Physician Assistant

## 2022-09-03 ENCOUNTER — Ambulatory Visit: Payer: Medicaid Other

## 2022-09-03 ENCOUNTER — Telehealth: Payer: Self-pay | Admitting: Internal Medicine

## 2022-09-03 NOTE — Telephone Encounter (Signed)
Returned pt call. No answer. Left message for pt to call back.  

## 2022-09-03 NOTE — Telephone Encounter (Signed)
Pt would like a callback regarding having his coumadin appt moved to [facility]. Please advise

## 2022-09-04 NOTE — Telephone Encounter (Signed)
Pt called back to call center and made an INR appt for 09/05/22.

## 2022-09-05 ENCOUNTER — Ambulatory Visit: Payer: Medicaid Other | Attending: Cardiology | Admitting: *Deleted

## 2022-09-05 DIAGNOSIS — Z5181 Encounter for therapeutic drug level monitoring: Secondary | ICD-10-CM | POA: Diagnosis not present

## 2022-09-05 DIAGNOSIS — Z952 Presence of prosthetic heart valve: Secondary | ICD-10-CM | POA: Diagnosis not present

## 2022-09-05 LAB — POCT INR: INR: 3.2 — AB (ref 2.0–3.0)

## 2022-09-05 NOTE — Patient Instructions (Signed)
Denies missing doses. Been on Augmentin x 10 days.  Finishes tomorrow. Been taking warfarin 1 1/2 tablets daily (15mg ).  Take 1/2 tablet tonight then continue 1 1/2 tablets daily. Recheck INR in 2 weeks.

## 2022-09-18 DIAGNOSIS — F411 Generalized anxiety disorder: Secondary | ICD-10-CM | POA: Diagnosis not present

## 2022-09-18 DIAGNOSIS — F4312 Post-traumatic stress disorder, chronic: Secondary | ICD-10-CM | POA: Diagnosis not present

## 2022-09-18 DIAGNOSIS — F410 Panic disorder [episodic paroxysmal anxiety] without agoraphobia: Secondary | ICD-10-CM | POA: Diagnosis not present

## 2022-09-18 DIAGNOSIS — F321 Major depressive disorder, single episode, moderate: Secondary | ICD-10-CM | POA: Diagnosis not present

## 2022-09-21 IMAGING — CT CT RENAL STONE PROTOCOL
2 of 4 series · 16 of 46 positions shown, 18 images · non-contrast
Comparison: July 23, 2021.

CLINICAL DATA: Acute right flank pain.



[Series 2: axial st · axial · 0.86mm/px · z∈[+919,+1334]mm · 13 of 93 slices shown, 15 images]
[im 5/93  soft-tissue]
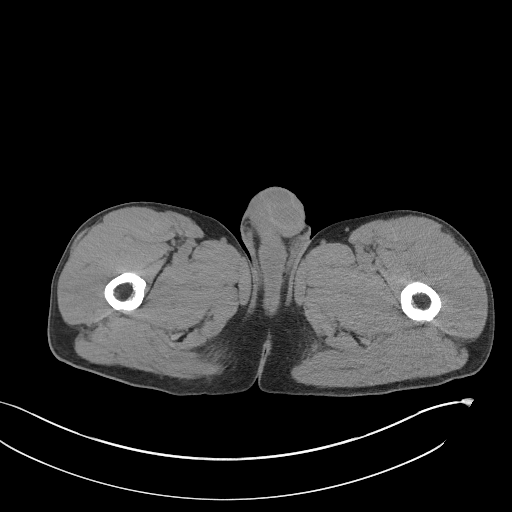
[im 5/93  bone]
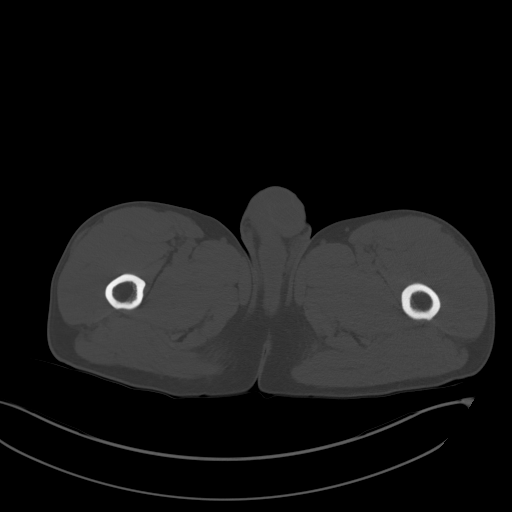
[im 14/93  soft-tissue]
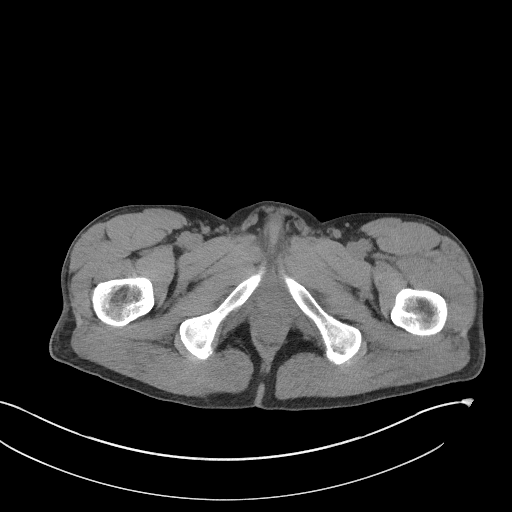
[im 19/93  soft-tissue]
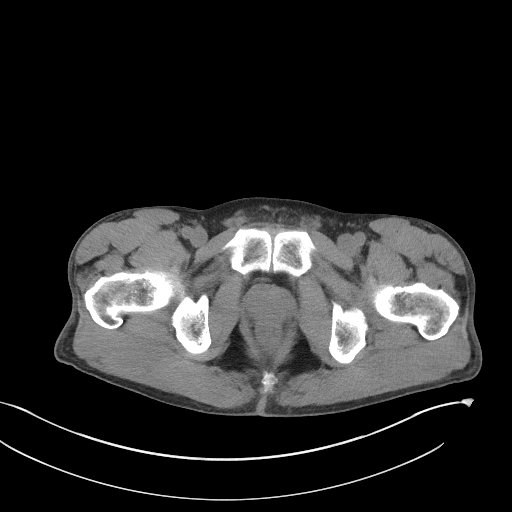
[im 28/93  soft-tissue]
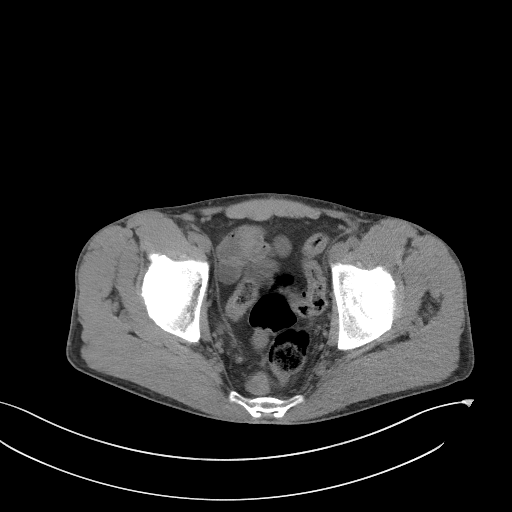
[im 33/93  soft-tissue]
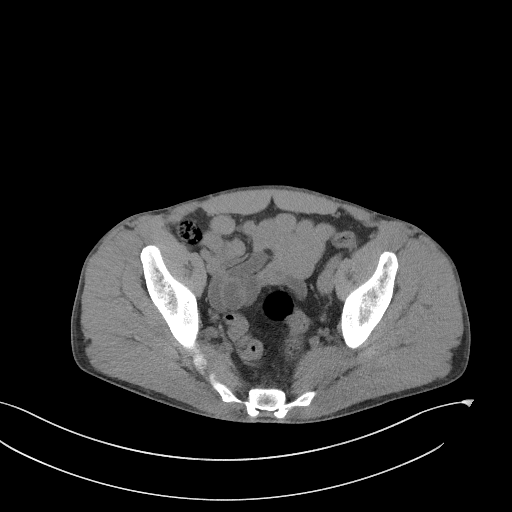
[im 42/93  soft-tissue]
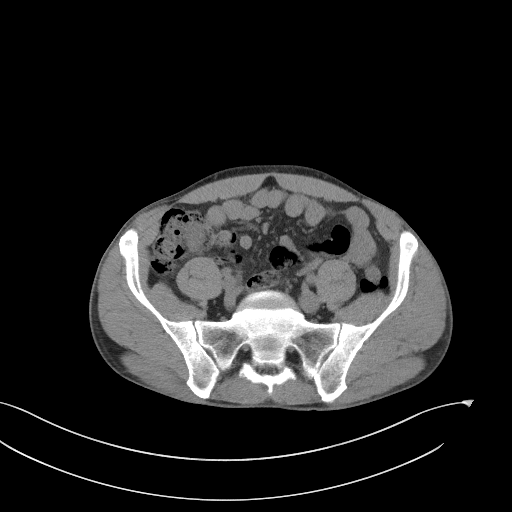
[im 47/93  soft-tissue]
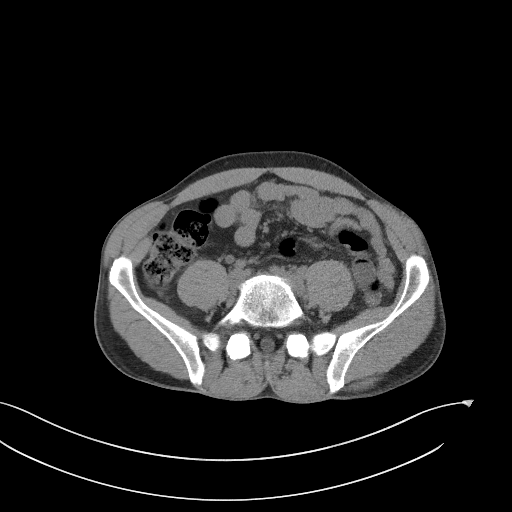
[im 51/93  soft-tissue]
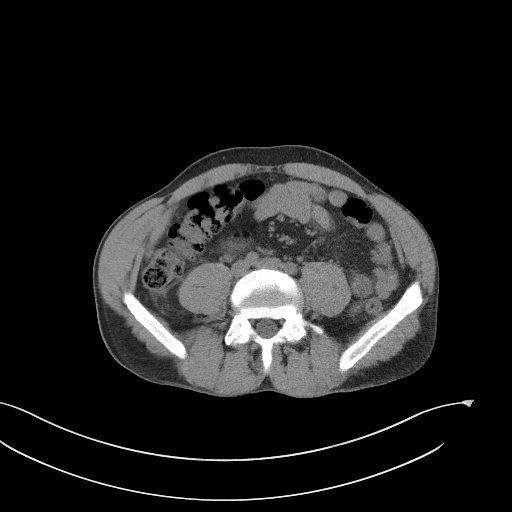
[im 60/93  soft-tissue]
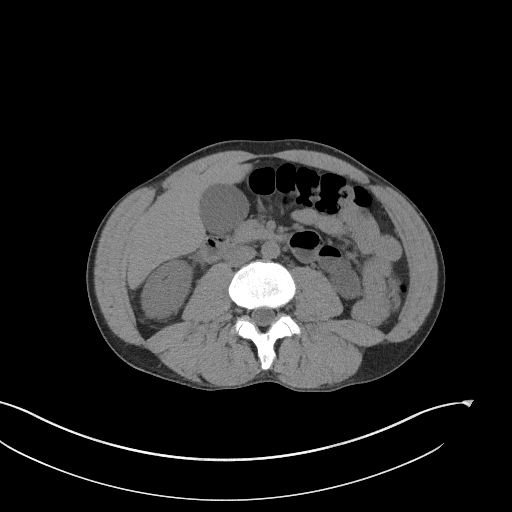
[im 60/93  bone]
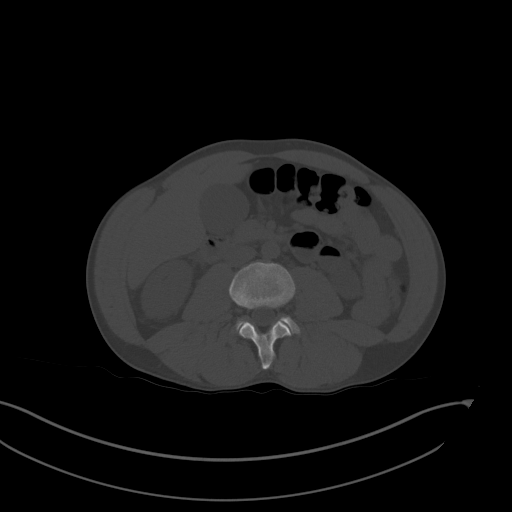
[im 65/93  soft-tissue]
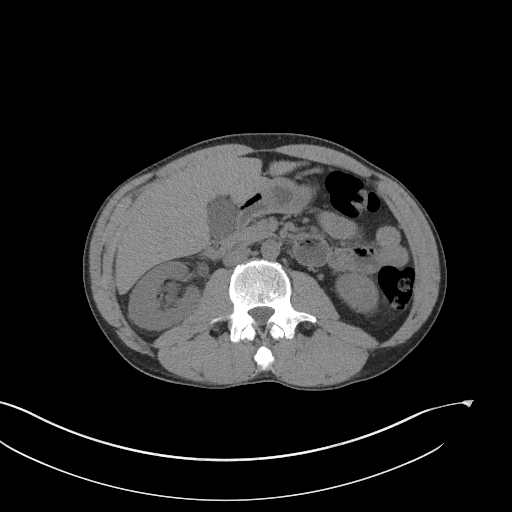
[im 74/93  soft-tissue]
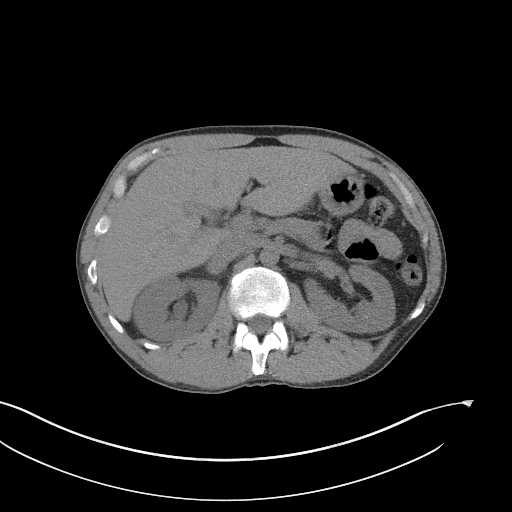
[im 79/93  soft-tissue]
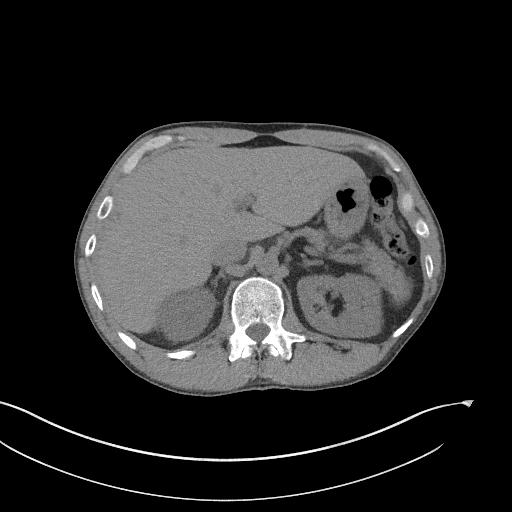
[im 88/93  soft-tissue]
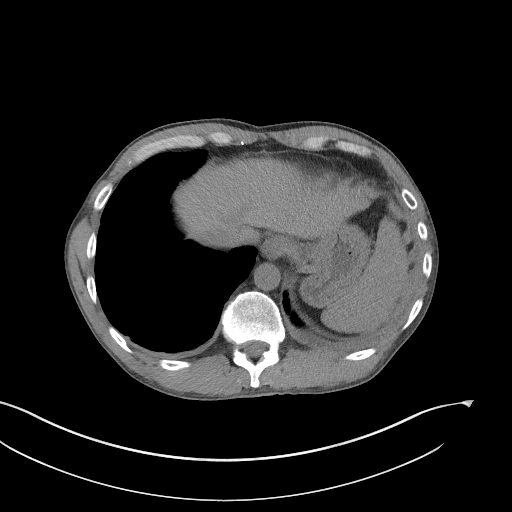

[Series 5: coronal st · coronal · 0.80mm/px · 3 of 101 slices shown]
[im 34/101  soft-tissue]
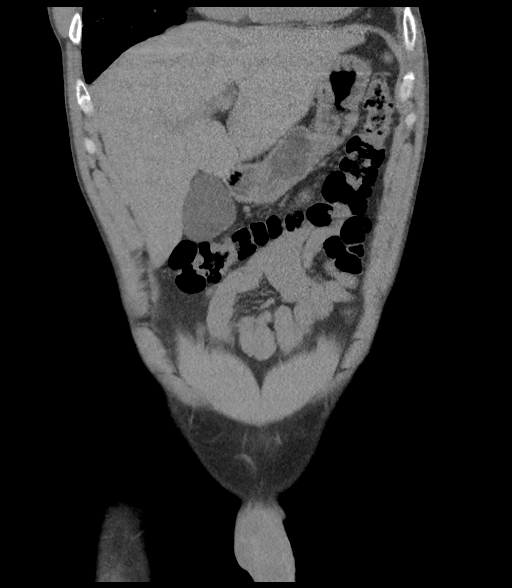
[im 45/101  soft-tissue]
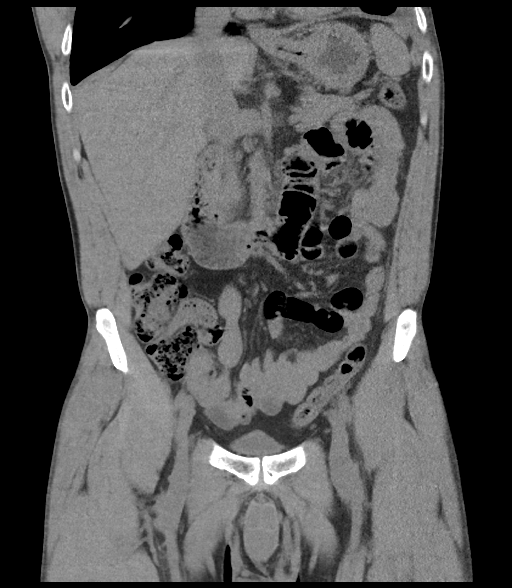
[im 56/101  soft-tissue]
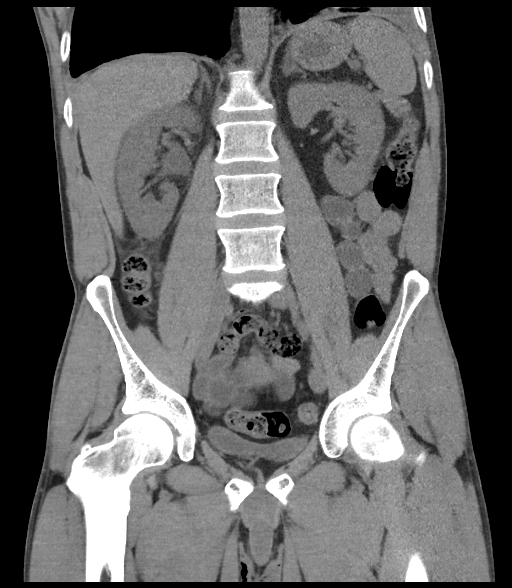

[16 of 46 positions shown; findings below may reference images not displayed]

FINDINGS: Lower chest: Stable probable small loculated left pleural effusion
and associated atelectasis or scarring seen in the left lung base.
Stable probable scarring seen in right lung base.

Hepatobiliary: No focal liver abnormality is seen. No gallstones,
gallbladder wall thickening, or biliary dilatation.

Pancreas: Unremarkable. No pancreatic ductal dilatation or
surrounding inflammatory changes.

Spleen: Normal in size without focal abnormality.

Adrenals/Urinary Tract: Adrenal glands appear normal. Stable mild
right hydroureteronephrosis is noted secondary to 3 mm calculus in
the distal right ureter. Urinary bladder is otherwise unremarkable.
Left kidney is unremarkable.

Stomach/Bowel: Stomach is within normal limits. Appendix appears
normal. No evidence of bowel wall thickening, distention, or
inflammatory changes.

Vascular/Lymphatic: No significant vascular findings are present. No
enlarged abdominal or pelvic lymph nodes.

Reproductive: Prostate is unremarkable.

Other: No abdominal wall hernia or abnormality. No abdominopelvic
ascites.

Musculoskeletal: No acute or significant osseous findings.
IMPRESSION: Stable mild right hydroureteronephrosis is noted secondary to 3 mm
calculus in distal right ureter.

## 2022-10-09 ENCOUNTER — Other Ambulatory Visit: Payer: Self-pay | Admitting: Physician Assistant

## 2022-10-09 DIAGNOSIS — R0609 Other forms of dyspnea: Secondary | ICD-10-CM

## 2022-10-25 ENCOUNTER — Telehealth (HOSPITAL_COMMUNITY): Payer: Self-pay | Admitting: *Deleted

## 2022-10-25 NOTE — Telephone Encounter (Signed)
Spoke with patient about his Stress Echo on 10/26/22 at @ 2:30. Also reminded him not to take his metoprolol for 24 hrs.

## 2022-10-26 ENCOUNTER — Ambulatory Visit (HOSPITAL_COMMUNITY): Payer: Medicaid Other

## 2022-10-30 ENCOUNTER — Ambulatory Visit: Payer: Medicaid Other | Attending: Cardiology | Admitting: *Deleted

## 2022-10-30 DIAGNOSIS — Z5181 Encounter for therapeutic drug level monitoring: Secondary | ICD-10-CM

## 2022-10-30 DIAGNOSIS — Z952 Presence of prosthetic heart valve: Secondary | ICD-10-CM

## 2022-10-30 LAB — POCT INR: INR: 4.5 — AB (ref 2.0–3.0)

## 2022-10-30 NOTE — Patient Instructions (Signed)
Hold warfarin tonight then decrease dose to 1 1/2 tablets daily except 1 tablet on Sundays and Wednesdays Recheck INR in 3 weeks.

## 2022-11-07 ENCOUNTER — Telehealth (HOSPITAL_COMMUNITY): Payer: Self-pay | Admitting: *Deleted

## 2022-11-07 ENCOUNTER — Encounter (HOSPITAL_COMMUNITY): Payer: Self-pay | Admitting: *Deleted

## 2022-11-07 NOTE — Telephone Encounter (Signed)
Left message with instructions for upcoming stress test..  Smith, Mary Jacqueline  

## 2022-11-07 NOTE — Telephone Encounter (Signed)
Letter sent to my chart with instructions for upcoming stress test.  [name], Scott Jacqueline  

## 2022-11-14 ENCOUNTER — Ambulatory Visit (HOSPITAL_COMMUNITY): Payer: Medicaid Other | Attending: Cardiology

## 2022-11-14 ENCOUNTER — Other Ambulatory Visit: Payer: Self-pay

## 2022-11-14 ENCOUNTER — Ambulatory Visit (HOSPITAL_BASED_OUTPATIENT_CLINIC_OR_DEPARTMENT_OTHER): Payer: Medicaid Other

## 2022-11-14 DIAGNOSIS — S81812A Laceration without foreign body, left lower leg, initial encounter: Secondary | ICD-10-CM | POA: Insufficient documentation

## 2022-11-14 DIAGNOSIS — Y9302 Activity, running: Secondary | ICD-10-CM | POA: Insufficient documentation

## 2022-11-14 DIAGNOSIS — R42 Dizziness and giddiness: Secondary | ICD-10-CM

## 2022-11-14 DIAGNOSIS — R9431 Abnormal electrocardiogram [ECG] [EKG]: Secondary | ICD-10-CM | POA: Diagnosis not present

## 2022-11-14 DIAGNOSIS — W228XXA Striking against or struck by other objects, initial encounter: Secondary | ICD-10-CM | POA: Insufficient documentation

## 2022-11-14 DIAGNOSIS — Z7901 Long term (current) use of anticoagulants: Secondary | ICD-10-CM | POA: Insufficient documentation

## 2022-11-14 DIAGNOSIS — R5383 Other fatigue: Secondary | ICD-10-CM | POA: Insufficient documentation

## 2022-11-14 MED ORDER — PERFLUTREN LIPID MICROSPHERE
1.0000 mL | INTRAVENOUS | Status: AC | PRN
Start: 2022-11-14 — End: 2022-11-14
  Administered 2022-11-14: 4 mL via INTRAVENOUS

## 2022-11-14 NOTE — ED Triage Notes (Signed)
Pt presents to ED with laceration to left shin, pt is on blood thinners. Bleeding is controlled at this time.

## 2022-11-15 ENCOUNTER — Emergency Department (HOSPITAL_COMMUNITY): Payer: Medicaid Other

## 2022-11-15 ENCOUNTER — Emergency Department (HOSPITAL_COMMUNITY)
Admission: EM | Admit: 2022-11-15 | Discharge: 2022-11-15 | Disposition: A | Discharge status: Home / Self Care | Payer: Medicaid Other | Attending: Emergency Medicine | Admitting: Emergency Medicine

## 2022-11-15 DIAGNOSIS — S81812A Laceration without foreign body, left lower leg, initial encounter: Secondary | ICD-10-CM | POA: Diagnosis not present

## 2022-11-15 MED ORDER — DOXYCYCLINE HYCLATE 100 MG PO CAPS: 100.0000 mg | ORAL_CAPSULE | Freq: Two times a day (BID) | ORAL | 0 refills | Status: DC

## 2022-11-15 MED ORDER — LIDOCAINE-EPINEPHRINE (PF) 2 %-1:200000 IJ SOLN
10.0000 mL | Freq: Once | INTRAMUSCULAR | Status: AC
  Administered 2022-11-15: 10 mL
  Filled 2022-11-15: qty 20

## 2022-11-15 NOTE — ED Notes (Signed)
ED Provider at bedside. 

## 2022-11-15 NOTE — ED Provider Notes (Signed)
[place] EMERGENCY DEPARTMENT AT [facility] Provider Note   CSN: [id number] Arrival date & time: 11/14/22  2329     History  Chief Complaint  Patient presents with   Laceration    Scott [name] is a 47 y.o. male.  Presents with a laceration to the left shin.  Patient reports that he was running through some vegetation and hit his shin on something, not sure what caused him.  He has a long abrasion and laceration to the anterior shin.  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  Tetanus is up-to-date.       Home Medications Prior to Admission medications   Medication Sig Start Date End Date Taking? Authorizing Provider  doxycycline (VIBRAMYCIN) 100 MG capsule Take 1 capsule (100 mg total) by mouth 2 (two) times daily. 11/15/22  Yes Niki Cosman, Canary Brim, MD  acetaminophen (TYLENOL) 325 MG tablet Take 650 mg by mouth every 6 (six) hours as needed.    [provider]  buPROPion (WELLBUTRIN XL) 150 MG 24 hr tablet Take 150 mg by mouth every morning. 11/28/21   [provider]  cetirizine (ZYRTEC) 10 MG tablet Take 10 mg by mouth daily.    [provider]  clonazePAM (KLONOPIN) 0.5 MG tablet Take 0.25-0.5 mg by mouth daily as needed. 11/28/21   [provider]  enoxaparin (LOVENOX) 40 MG/0.4ML injection Inject 0.75 mLs (75 mg total) into the skin every 12 (twelve) hours for 3 days. Patient not taking: Reported on 08/28/2022 08/27/22 08/30/22  Pollyann Savoy, MD  hydrochlorothiazide (MICROZIDE) 12.5 MG capsule Take 1 capsule (12.5 mg total) by mouth daily. 06/07/22   Sharlene Dory, PA-C  HYDROcodone-acetaminophen (NORCO/VICODIN) 5-325 MG tablet Take 1 tablet by mouth every 6 (six) hours as needed for severe pain. 08/27/22   Pollyann Savoy, MD  lidocaine (LIDODERM) 5 % Place 1 patch onto the skin daily. Remove & Discard patch within 12 hours or as directed by MD 02/22/22   Particia Nearing, PA-C   metoprolol succinate (TOPROL-XL) 25 MG 24 hr tablet Take 0.5 tablets (12.5 mg total) by mouth daily. 07/20/22   Sharlene Dory, PA-C  ondansetron (ZOFRAN-ODT) 4 MG disintegrating tablet Take by mouth. 07/23/21   [provider]  rosuvastatin (CRESTOR) 10 MG tablet Take 1 tablet (10 mg total) by mouth daily. 07/20/22   Sharlene Dory, PA-C  warfarin (COUMADIN) 10 MG tablet Take 1 1/2 tablets daily as directed 11/16/21   [name], MD      Allergies    Cocoa    Review of Systems   Review of Systems  Physical Exam Updated Vital Signs BP 125/83   Pulse 98   Temp 99.9 F (37.7 C) (Oral)   Resp 19   Wt 70.3 kg   SpO2 99%   BMI 21.62 kg/m  Physical Exam Vitals and nursing note reviewed.  Constitutional:      Appearance: Normal appearance.  HENT:     Head: Atraumatic.  Musculoskeletal:     Left knee: Normal.     Left ankle: Laceration (8 mm linear laceration with distal portion superficial linear abrasion) present.  Skin:    Findings: Abrasion and laceration present.  Neurological:     Mental Status: He is alert.     Sensory: Sensation is intact.     Motor: Motor function is intact.     ED Results / Procedures / Treatments   Labs (all  labs ordered are listed, but only abnormal results are displayed) Labs Reviewed - No data to display  EKG None  Radiology DG Tibia/Fibula Left  Result Date: 11/15/2022 CLINICAL DATA:  Laceration EXAM: LEFT TIBIA AND FIBULA - 2 VIEW COMPARISON:  None Available. FINDINGS: There is no evidence of fracture or other focal bone lesions. No radiopaque foreign body in the soft tissues. IMPRESSION: Negative. Electronically Signed   By: Jasmine Pang M.D.   On: 11/15/2022 00:16   ECHOCARDIOGRAM STRESS TEST  Result Date: 11/14/2022     EXERCISE STRESS ECHO REPORT    -------------------------------------------------------------------------------- Patient Name:   Scott [name] Date of Exam: 11/14/2022 Medical Rec #:  [id number]       Height:        71.0 in Accession #:    [id number]      Weight:       159.6 lb Date of Birth:  12-16-1975       BSA:          1.916 m Patient Age:    47 years        BP:           138/68 mmHg Patient Gender: M               HR:           60 bpm. Exam Location:  [address] Procedure: 2D Echo, Stress Echo and Intracardiac Opacification Agent Indications:    R42 Dizziness                 R53.83 Fatigue  History:        Patient has prior history of Echocardiogram examinations, most                 recent 07/09/2022.  Sonographer:    Sedonia Small Rodgers-Jones RDCS Referring Phys: [id number] TESSA N CONTE IMPRESSIONS  1. This is a negative stress echocardiogram for ischemia.  2. This is a low risk study. FINDINGS Exam Protocol: The patient exercised on a treadmill according to a Bruce protocol. Definity contrast agent was given IV to delineate the left ventricular endocardial borders.  Patient Performance: The patient exercised for 11 minutes achieving 13.3 METS. The maximum stage achieved was III of the Bruce protocol. The heart rate at peak stress was 171 bpm. The target heart rate was calculated to be 148 bpm. The percentage of maximum predicted heart rate achieved was 98.4 %. The baseline blood pressure was 138/68 mmHg. The blood pressure at peak stress was 196/59 mmHg. The patient developed no symptoms during the stress exam.  EKG: Resting EKG showed normal sinus rhythm with right bundle branch block and left anterior fascicular block. The patient developed no abnormal EKG findings during exercise.  2D Echo Findings: The baseline ejection fraction was 60%. The peak ejection fraction at stress was 80%. Baseline regional wall motion abnormalities were not present. There were no stress-induced wall motion abnormalities. This is a negative stress echocardiogram for ischemia.  Thurmon Fair MD Electronically signed on 11/14/2022 at 5:46:24 PM    Final     Procedures .Marland KitchenLaceration Repair  Date/Time: 11/15/2022 1:46 AM  Performed by:  Gilda Crease, MD Authorized by: Gilda Crease, MD   Consent:    Consent obtained:  Verbal   Consent given by:  Patient   Risks, benefits, and alternatives were discussed: yes     Risks discussed:  Infection, pain, poor cosmetic result and retained foreign body Universal protocol:    Procedure  explained and questions answered to patient or proxy's satisfaction: yes     Relevant documents present and verified: yes     Test results available: yes     Imaging studies available: yes     Required blood products, implants, devices, and special equipment available: yes     Site/side marked: yes     Immediately prior to procedure, a time out was called: yes     Patient identity confirmed:  Verbally with patient Anesthesia:    Anesthesia method:  Local infiltration   Local anesthetic:  Lidocaine 2% WITH epi Laceration details:    Location:  Leg   Leg location:  L lower leg   Length (cm):  8 Pre-procedure details:    Preparation:  Patient was prepped and draped in usual sterile fashion and imaging obtained to evaluate for foreign bodies Exploration:    Hemostasis achieved with:  Epinephrine   Imaging obtained: x-ray     Imaging outcome: foreign body not noted     Wound exploration: wound explored through full range of motion and entire depth of wound visualized   Treatment:    Area cleansed with:  Chlorhexidine   Amount of cleaning:  Standard   Irrigation solution:  Sterile saline   Irrigation volume:  1000   Irrigation method:  Pressure wash   Debridement:  Minimal   Undermining:  None Skin repair:    Repair method:  Sutures   Suture size:  3-0   Suture material:  Prolene   Suture technique:  Simple interrupted   Number of sutures:  8 Approximation:    Approximation:  Close Repair type:    Repair type:  Simple Post-procedure details:    Dressing:  Non-adherent dressing   Procedure completion:  Tolerated well, no immediate complications     Medications  Ordered in ED Medications  lidocaine-EPINEPHrine (XYLOCAINE W/EPI) 2 %-1:200000 (PF) injection 10 mL (10 mLs Infiltration Given 11/15/22 0115)    ED Course/ Medical Decision Making/ A&P                                 Medical Decision Making Amount and/or Complexity of Data Reviewed Radiology: ordered.  Risk Prescription drug management.   Presents with a large linear wound on left shin.  Majority of the wound is an abrasion that did not require repair, however the top 8 cm was deep enough to require sutures which were placed.  Because patient has a mechanical valve, will provide antibiotic coverage.  Suture removal in`10 days.        Final Clinical Impression(s) / ED Diagnoses Final diagnoses:  Laceration of left lower extremity, initial encounter    Rx / DC Orders ED Discharge Orders          Ordered    doxycycline (VIBRAMYCIN) 100 MG capsule  2 times daily        11/15/22 0149              Gilda Crease, MD 11/15/22 0150

## 2022-11-20 ENCOUNTER — Ambulatory Visit: Payer: Medicaid Other | Attending: Internal Medicine | Admitting: *Deleted

## 2022-11-20 DIAGNOSIS — Z952 Presence of prosthetic heart valve: Secondary | ICD-10-CM

## 2022-11-20 DIAGNOSIS — Z5181 Encounter for therapeutic drug level monitoring: Secondary | ICD-10-CM

## 2022-11-20 LAB — POCT INR: INR: 7.8 — AB (ref 2.0–3.0)

## 2022-11-20 NOTE — Patient Instructions (Signed)
Hold warfarin tonight and tomorrow nigh and recheck on Thursday On doxycyline 100mg  twice daily x 10 days.  Has 5 days left. Denies s/s of bleeding.  Bleeding and fall precautions discussed with pt and he verbalized understanding. Recheck INR in 2-3 days

## 2022-11-22 ENCOUNTER — Ambulatory Visit: Payer: Medicaid Other | Attending: Internal Medicine | Admitting: *Deleted

## 2022-11-22 DIAGNOSIS — Z5181 Encounter for therapeutic drug level monitoring: Secondary | ICD-10-CM | POA: Diagnosis not present

## 2022-11-22 DIAGNOSIS — Z952 Presence of prosthetic heart valve: Secondary | ICD-10-CM | POA: Diagnosis not present

## 2022-11-22 LAB — POCT INR: INR: 2.7 (ref 2.0–3.0)

## 2022-11-22 NOTE — Patient Instructions (Signed)
Restart warfarin 1 tablet daily except 1 1/2 tablets on Mondays, Wednesdays and Fridays Finishes Doxycycline today Recheck INR in 2 weeks

## 2022-11-29 ENCOUNTER — Ambulatory Visit (INDEPENDENT_AMBULATORY_CARE_PROVIDER_SITE_OTHER): Payer: Medicaid Other | Admitting: Internal Medicine

## 2022-11-29 ENCOUNTER — Encounter: Payer: Self-pay | Admitting: Internal Medicine

## 2022-11-29 ENCOUNTER — Encounter (INDEPENDENT_AMBULATORY_CARE_PROVIDER_SITE_OTHER): Payer: Self-pay | Admitting: *Deleted

## 2022-11-29 VITALS — BP 90/61 | HR 60 | Ht 70.0 in | Wt 156.8 lb

## 2022-11-29 DIAGNOSIS — Z1211 Encounter for screening for malignant neoplasm of colon: Secondary | ICD-10-CM

## 2022-11-29 DIAGNOSIS — Z0001 Encounter for general adult medical examination with abnormal findings: Secondary | ICD-10-CM

## 2022-11-29 DIAGNOSIS — F419 Anxiety disorder, unspecified: Secondary | ICD-10-CM | POA: Diagnosis not present

## 2022-11-29 DIAGNOSIS — I480 Paroxysmal atrial fibrillation: Secondary | ICD-10-CM

## 2022-11-29 DIAGNOSIS — E782 Mixed hyperlipidemia: Secondary | ICD-10-CM

## 2022-11-29 DIAGNOSIS — R3912 Poor urinary stream: Secondary | ICD-10-CM

## 2022-11-29 DIAGNOSIS — R739 Hyperglycemia, unspecified: Secondary | ICD-10-CM | POA: Diagnosis not present

## 2022-11-29 DIAGNOSIS — Z952 Presence of prosthetic heart valve: Secondary | ICD-10-CM | POA: Diagnosis not present

## 2022-11-29 DIAGNOSIS — Z720 Tobacco use: Secondary | ICD-10-CM

## 2022-11-29 NOTE — Assessment & Plan Note (Signed)
On Crestor 

## 2022-11-29 NOTE — Assessment & Plan Note (Signed)
Followed by cardiology and anticoagulation clinic On Coumadin

## 2022-11-29 NOTE — Assessment & Plan Note (Signed)
Physical exam as documented. Fasting blood tests today. CBC and BMP reviewed from chart. He reports taking Tdap vaccine around 2020. Referred to GI for screening colonoscopy.

## 2022-11-29 NOTE — Assessment & Plan Note (Signed)
Smokes about 1-1.5 pack/day  Asked about quitting: confirms that he/she currently smokes cigarettes Advise to quit smoking: Educated about QUITTING to reduce the risk of cancer, cardio and cerebrovascular disease. Assess willingness: Unwilling to quit at this time, but is working on cutting back. Assist with counseling and pharmacotherapy: Counseled for 5 minutes and literature provided. Arrange for follow up: follow up in 3 months and continue to offer help.

## 2022-11-29 NOTE — Assessment & Plan Note (Signed)
His symptoms could be due to BPH, although he is considered young for BPH Check PSA, considering his family history of prostate cancer If PSA WNL, can give trial of Flomax

## 2022-11-29 NOTE — Patient Instructions (Signed)
Please continue to take medications as prescribed.  Please continue to follow low salt diet and perform moderate exercise/walking at least 150 mins/week. 

## 2022-11-29 NOTE — Assessment & Plan Note (Addendum)
Had atrial fibrillation following AVR/[place] Now rate controlled with metoprolol On Coumadin for [facility]

## 2022-11-29 NOTE — Assessment & Plan Note (Signed)
Uncontrolled [facility], but prefers to see [facility] now - undergoing insurance authorization Has stopped taking Wellbutrin and clonazepam currently

## 2022-11-29 NOTE — Progress Notes (Signed)
Established Patient Office Visit  Subjective:  Patient ID: Scott [name], male    DOB: November 30, 1975  Age: 47 y.o. MRN: [id number]  CC:  Chief Complaint  Patient presents with   PSA levels    Patient is wanting his prostate levels checked    Annual Exam    HPI Scott [name] is a 47 y.o. 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  He is followed by cardiology for PAF and other congenital cardiac conditions.  He is currently on metoprolol and HCTZ.  He takes Coumadin for [facility].  Denies any active bleeding currently.  He reports weak urinary stream and postvoid dribbling for the last few months.  He is concerned as his father and uncle has history of prostate cancer.  He currently denies any dysuria or hematuria.  He denies any urethral discharge currently.  He had injury on his left leg while chasing an animal in the woods (was attacking his chickens).  He had a laceration on left shin, which required stitching in the ER.  Wound is healing well currently.  He has completed doxycycline for it as well.  Denies any fever or chills recently.   Past Medical History:  Diagnosis Date   Anxiety    Aortic stenosis    Murmur, cardiac    Pancreatitis, acute 01/28/2017    Past Surgical History:  Procedure Laterality Date   AORTIC VALVE REPAIR     when pt was 47 years old   CARDIAC SURGERY     FINGER SURGERY     MECHANICAL AORTIC VALVE REPLACEMENT     pt has card with mechanical valve replacemnt details   RIGHT/LEFT HEART CATH AND CORONARY ANGIOGRAPHY Bilateral 09/17/2018   Procedure: RIGHT/LEFT HEART CATH AND CORONARY ANGIOGRAPHY;  Surgeon: Marcina Millard, MD;  Location: [facility] INVASIVE CV LAB;  Service: Cardiovascular;  Laterality:  Bilateral;    History reviewed. No pertinent family history.  Social History   Socioeconomic History   Marital status: Married    Spouse name: Not on file   Number of children: 5   Years of education: Not on file   Highest education level: Not on file  Occupational History   Occupation: Scientist, clinical (histocompatibility and immunogenetics)    Comment: Curator, services [employer]  Tobacco Use   Smoking status: Every Day    Current packs/day: 1.50    Average packs/day: 1.5 packs/day for 30.0 years (45.0 ttl pk-yrs)    Types: Cigarettes   Smokeless tobacco: Never  Vaping Use   Vaping status: Never Used  Substance and Sexual Activity   Alcohol use: Not Currently    Comment: occ   Drug use: Yes    Types: Marijuana    Comment: daily use   Sexual activity: Yes  Other Topics Concern   Not on file  Social History Narrative   Not on file   Social Determinants of Health   Financial Resource Strain: Not on file  Food Insecurity: Not on file  Transportation Needs: No Transportation Needs (08/28/2022)   PRAPARE - Administrator, Civil Service (Medical): No    Lack of Transportation (Non-Medical): No  Physical Activity: Not on file  Stress: Stress Concern Present (09/01/2021)   [employer] of Occupational Health - Occupational Stress Questionnaire  Feeling of Stress : Rather much  Social Connections: Not on file  Intimate Partner Violence: Not on file    Outpatient Medications Prior to Visit  Medication Sig Dispense Refill   acetaminophen (TYLENOL) 325 MG tablet Take 650 mg by mouth every 6 (six) hours as needed.     cetirizine (ZYRTEC) 10 MG tablet Take 10 mg by mouth daily.     hydrochlorothiazide (MICROZIDE) 12.5 MG capsule Take 1 capsule (12.5 mg total) by mouth daily. 90 capsule 3   lidocaine (LIDODERM) 5 % Place 1 patch onto the skin daily. Remove & Discard patch within 12 hours or as directed by MD 30 patch 0   metoprolol succinate (TOPROL-XL) 25 MG 24 hr tablet Take 0.5 tablets (12.5 mg  total) by mouth daily. 45 tablet 3   rosuvastatin (CRESTOR) 10 MG tablet Take 1 tablet (10 mg total) by mouth daily. 90 tablet 3   warfarin (COUMADIN) 10 MG tablet Take 1 1/2 tablets daily as directed 135 tablet 1   buPROPion (WELLBUTRIN XL) 300 MG 24 hr tablet Take 300 mg by mouth daily.     clonazePAM (KLONOPIN) 0.5 MG tablet Take 0.25-0.5 mg by mouth daily as needed. (Patient not taking: Reported on 11/29/2022)     ondansetron (ZOFRAN-ODT) 4 MG disintegrating tablet Take by mouth. (Patient not taking: Reported on 11/29/2022)     buPROPion (WELLBUTRIN XL) 150 MG 24 hr tablet Take 150 mg by mouth every morning. (Patient not taking: Reported on 11/29/2022)     doxycycline (VIBRAMYCIN) 100 MG capsule Take 1 capsule (100 mg total) by mouth 2 (two) times daily. (Patient not taking: Reported on 11/29/2022) 14 capsule 0   enoxaparin (LOVENOX) 40 MG/0.4ML injection Inject 0.75 mLs (75 mg total) into the skin every 12 (twelve) hours for 3 days. (Patient not taking: Reported on 08/28/2022) 4.5 mL 0   HYDROcodone-acetaminophen (NORCO/VICODIN) 5-325 MG tablet Take 1 tablet by mouth every 6 (six) hours as needed for severe pain. (Patient not taking: Reported on 11/29/2022) 12 tablet 0   No facility-administered medications prior to visit.    Allergies  Allergen Reactions   Cocoa Hives    Pt sts he is allergic to Coco Puffs    ROS Review of Systems  Constitutional:  Negative for chills and fever.  HENT:  Negative for congestion and sore throat.   Eyes:  Negative for pain and discharge.  Respiratory:  Negative for cough and shortness of breath.   Cardiovascular:  Negative for chest pain and palpitations.  Gastrointestinal:  Negative for diarrhea, nausea and vomiting.  Endocrine: Negative for polydipsia and polyuria.  Genitourinary:  Positive for difficulty urinating. Negative for dysuria, hematuria and penile pain.  Musculoskeletal:  Negative for neck pain and neck stiffness.  Skin:  Negative for rash.   Neurological:  Negative for dizziness, weakness, numbness and headaches.  Psychiatric/Behavioral:  Positive for dysphoric mood and sleep disturbance. Negative for agitation, behavioral problems and suicidal ideas. The patient is nervous/anxious.       Objective:    Physical Exam Vitals reviewed.  Constitutional:      General: He is not in acute distress.    Appearance: He is not diaphoretic.  HENT:     Head: Normocephalic and atraumatic.     Nose: Nose normal.     Mouth/Throat:     Mouth: Mucous membranes are moist.  Eyes:     General: No scleral icterus.    Extraocular Movements: Extraocular movements intact.  Cardiovascular:  Rate and Rhythm: Normal rate and regular rhythm.     Heart sounds: Normal heart sounds. No murmur heard. Pulmonary:     Breath sounds: Normal breath sounds. No wheezing or rales.  Abdominal:     Palpations: Abdomen is soft.     Tenderness: There is no abdominal tenderness.  Musculoskeletal:     Cervical back: Neck supple. No tenderness.     Right lower leg: No edema.     Left lower leg: No edema.  Skin:    General: Skin is warm.     Findings: No rash.     Comments: Laceration over left shin area, about 15 cm in length-healing well  Neurological:     General: No focal deficit present.     Mental Status: He is alert and oriented to person, place, and time.     Cranial Nerves: No cranial nerve deficit.     Sensory: No sensory deficit.     Motor: No weakness.  Psychiatric:        Mood and Affect: Mood normal.        Behavior: Behavior normal.     BP 90/61 (BP Location: Left Arm, Patient Position: Sitting, Cuff Size: Normal)   Pulse 60   Ht 5\' 10"  (1.778 m)   Wt 156 lb 12.8 oz (71.1 kg)   SpO2 96%   BMI 22.50 kg/m  Wt Readings from Last 3 Encounters:  11/29/22 156 lb 12.8 oz (71.1 kg)  11/14/22 155 lb (70.3 kg)  08/26/22 159 lb 9.8 oz (72.4 kg)    Lab Results  Component Value Date   TSH 1.10 01/11/2021   Lab Results  Component  Value Date   WBC 10.4 08/26/2022   HGB 13.8 08/26/2022   HCT 41.4 08/26/2022   MCV 94.3 08/26/2022   PLT 248 08/26/2022   Lab Results  Component Value Date   NA 138 08/26/2022   K 4.0 08/26/2022   CO2 27 08/26/2022   GLUCOSE 99 08/26/2022   BUN 14 08/26/2022   CREATININE 0.93 08/26/2022   BILITOT <0.2 06/23/2021   ALKPHOS 127 (H) 06/23/2021   AST 20 06/23/2021   ALT 23 06/23/2021   PROT 7.0 06/23/2021   ALBUMIN 4.5 06/23/2021   CALCIUM 9.3 08/26/2022   ANIONGAP 7 08/26/2022   EGFR 96 06/07/2022   Lab Results  Component Value Date   CHOL 132 06/23/2021   Lab Results  Component Value Date   HDL 30 (L) 06/23/2021   Lab Results  Component Value Date   LDLCALC 80 06/23/2021   Lab Results  Component Value Date   TRIG 119 06/23/2021   Lab Results  Component Value Date   CHOLHDL 4.4 06/23/2021   No results found for: "HGBA1C"    Assessment & Plan:   Problem List Items Addressed This Visit       Cardiovascular and Mediastinum   PAF (paroxysmal atrial fibrillation) (HCC)    Had atrial fibrillation following AVR/[place] Now rate controlled with metoprolol On Coumadin for AC        Other   S/P aortic valve replacement (Chronic)    Followed by cardiology and anticoagulation clinic On Coumadin      Encounter for general adult medical examination with abnormal findings - Primary    Physical exam as documented. Fasting blood tests today. CBC and BMP reviewed from chart. He reports taking Tdap vaccine around 2020. Referred to GI for screening colonoscopy.      Anxiety    Uncontrolled  [facility], but [facility] to see [facility] now - undergoing insurance authorization Has stopped taking Wellbutrin and clonazepam currently      Tobacco user    Smokes about 1-1.5 pack/day  Asked about quitting: confirms that he/she currently smokes cigarettes Advise to quit smoking: Educated about QUITTING to reduce the risk of cancer, cardio and  cerebrovascular disease. Assess willingness: Unwilling to quit at this time, but is working on cutting back. Assist with counseling and pharmacotherapy: Counseled for 5 minutes and literature provided. Arrange for follow up: follow up in 3 months and continue to offer help.      Mixed hyperlipidemia    On Crestor      Relevant Orders   Lipid Profile   Weak urinary stream    His symptoms could be due to BPH, although he is considered young for BPH Check PSA, considering his family history of prostate cancer If PSA WNL, can give trial of Flomax      Relevant Orders   PSA   UA/M w/rflx Culture, Routine   Other Visit Diagnoses     Hyperglycemia       Relevant Orders   Hemoglobin A1c   Colon cancer screening       Relevant Orders   Ambulatory referral to Gastroenterology       No orders of the defined types were placed in this encounter.   Follow-up: Return in about 6 months (around 06/01/2023).    Anabel Halon, MD

## 2022-11-30 LAB — UA/M W/RFLX CULTURE, ROUTINE
Bilirubin, UA: NEGATIVE
Glucose, UA: NEGATIVE
Ketones, UA: NEGATIVE
Leukocytes,UA: NEGATIVE
Nitrite, UA: NEGATIVE
Protein,UA: NEGATIVE
Specific Gravity, UA: 1.025 (ref 1.005–1.030)
Urobilinogen, Ur: 0.2 mg/dL (ref 0.2–1.0)
pH, UA: 5.5 (ref 5.0–7.5)

## 2022-11-30 LAB — MICROSCOPIC EXAMINATION
Bacteria, UA: NONE SEEN
Casts: NONE SEEN /LPF
Epithelial Cells (non renal): NONE SEEN /HPF (ref 0–10)
WBC, UA: NONE SEEN /HPF (ref 0–5)

## 2022-12-01 LAB — LIPID PANEL
Chol/HDL Ratio: 2.6 ratio (ref 0.0–5.0)
Cholesterol, Total: 121 mg/dL (ref 100–199)
HDL: 47 mg/dL (ref >39)
LDL Chol Calc (NIH): 59 mg/dL (ref 0–99)
Triglycerides: 76 mg/dL (ref 0–149)
VLDL Cholesterol Cal: 15 mg/dL (ref 5–40)

## 2022-12-01 LAB — PSA: Prostate Specific Ag, Serum: 1.1 ng/mL (ref 0.0–4.0)

## 2022-12-01 LAB — HEMOGLOBIN A1C
Est. average glucose Bld gHb Est-mCnc: 120 mg/dL
Hgb A1c MFr Bld: 5.8 % — ABNORMAL HIGH (ref 4.8–5.6)

## 2022-12-04 ENCOUNTER — Ambulatory Visit (HOSPITAL_COMMUNITY): Payer: Medicaid Other

## 2022-12-06 ENCOUNTER — Ambulatory Visit: Payer: Medicaid Other | Attending: Internal Medicine

## 2022-12-13 ENCOUNTER — Ambulatory Visit
Admission: EM | Admit: 2022-12-13 | Discharge: 2022-12-13 | Disposition: A | Discharge status: Home / Self Care | Payer: Medicaid Other | Attending: Family Medicine | Admitting: Family Medicine

## 2022-12-13 DIAGNOSIS — H66002 Acute suppurative otitis media without spontaneous rupture of ear drum, left ear: Secondary | ICD-10-CM

## 2022-12-13 DIAGNOSIS — J441 Chronic obstructive pulmonary disease with (acute) exacerbation: Secondary | ICD-10-CM | POA: Diagnosis not present

## 2022-12-13 MED ORDER — PREDNISONE 20 MG PO TABS: 40.0000 mg | ORAL_TABLET | Freq: Every day | ORAL | 0 refills | Status: DC

## 2022-12-13 MED ORDER — AMOXICILLIN-POT CLAVULANATE 875-125 MG PO TABS: 1.0000 | ORAL_TABLET | Freq: Two times a day (BID) | ORAL | 0 refills | Status: DC

## 2022-12-13 MED ORDER — ALBUTEROL SULFATE HFA 108 (90 BASE) MCG/ACT IN AERS: 2.0000 | INHALATION_SPRAY | RESPIRATORY_TRACT | 0 refills | Status: DC | PRN

## 2022-12-13 MED ORDER — PROMETHAZINE-DM 6.25-15 MG/5ML PO SYRP: 5.0000 mL | ORAL_SOLUTION | Freq: Four times a day (QID) | ORAL | 0 refills | Status: DC | PRN

## 2022-12-13 NOTE — ED Provider Notes (Signed)
[facility]    CSN: [id number] Arrival date & time: 12/13/22  0908      History   Chief Complaint Chief Complaint  Patient presents with   Cough    HPI Scott [name] is a 47 y.o. male.   Patient presenting today with about a week and a half of chills, body aches, productive cough, chest tightness, nasal congestion, ear pain and pressure on the left.  Denies chest pain, shortness of breath, abdominal pain, nausea vomiting or diarrhea.  So far try Mucinex and Tylenol with no relief.    Past Medical History:  Diagnosis Date   Anxiety    Aortic stenosis    Murmur, cardiac    Pancreatitis, acute 01/28/2017    Patient Active Problem List   Diagnosis Date Noted   Mixed hyperlipidemia 11/29/2022   Weak urinary stream 11/29/2022   Skin tag 11/22/2021   Ureterolithiasis 07/25/2021   Elevated alkaline phosphatase level 06/28/2021   Cluster headache 03/28/2021   Acute midline low back pain 03/28/2021   DOE (dyspnea on exertion) 01/11/2021   Chest pain of unknown etiology 01/11/2021   Encounter for general adult medical examination with abnormal findings 10/04/2020   S/P aortic valve replacement 10/04/2020   Sinusitis, chronic 10/04/2020   Chronic anticoagulation 11/20/2018   Subtherapeutic international normalized ratio (INR) 11/20/2018   PAF (paroxysmal atrial fibrillation) (HCC) 10/24/2018   RBBB (right bundle branch block) 10/22/2018   Angina, class II (HCC) 10/20/2018   Bullous emphysema (HCC) 10/20/2018   CHF (congestive heart failure), NYHA class II, chronic, diastolic (HCC) 10/20/2018   S/P repair of coarctation of aorta 10/20/2018   Subclavian artery stenosis, left (HCC) 10/20/2018   Tobacco user 10/19/2018   Anxiety 06/12/2018   Cervical radiculopathy at C6 06/12/2018   Panic attacks 06/12/2018   Marijuana user 01/28/2017    Past Surgical History:  Procedure Laterality Date   AORTIC VALVE REPAIR     when pt was 47 years old   CARDIAC SURGERY      FINGER SURGERY     MECHANICAL AORTIC VALVE REPLACEMENT     pt has card with mechanical valve replacemnt details   RIGHT/LEFT HEART CATH AND CORONARY ANGIOGRAPHY Bilateral 09/17/2018   Procedure: RIGHT/LEFT HEART CATH AND CORONARY ANGIOGRAPHY;  Surgeon: Marcina Millard, MD;  Location: [facility] INVASIVE CV LAB;  Service: Cardiovascular;  Laterality: Bilateral;       Home Medications    Prior to Admission medications   Medication Sig Start Date End Date Taking? Authorizing Provider  acetaminophen (TYLENOL) 325 MG tablet Take 650 mg by mouth every 6 (six) hours as needed.   Yes [provider]  albuterol (VENTOLIN HFA) 108 (90 Base) MCG/ACT inhaler Inhale 2 puffs into the lungs every 4 (four) hours as needed for wheezing or shortness of breath. 12/13/22  Yes Particia Nearing, PA-C  amoxicillin-clavulanate (AUGMENTIN) 875-125 MG tablet Take 1 tablet by mouth every 12 (twelve) hours. 12/13/22  Yes Particia Nearing, PA-C  cetirizine (ZYRTEC) 10 MG tablet Take 10 mg by mouth daily.   Yes [provider]  hydrochlorothiazide (MICROZIDE) 12.5 MG capsule Take 1 capsule (12.5 mg total) by mouth daily. 06/07/22  Yes Asa Lente, Tessa N, PA-C  metoprolol succinate (TOPROL-XL) 25 MG 24 hr tablet Take 0.5 tablets (12.5 mg total) by mouth daily. 07/20/22  Yes Asa Lente, Tessa N, PA-C  predniSONE (DELTASONE) 20 MG tablet Take 2 tablets (40 mg total) by mouth daily with breakfast. 12/13/22  Yes Particia Nearing, PA-C  promethazine-dextromethorphan (PROMETHAZINE-DM) 6.25-15 MG/5ML syrup Take 5 mLs by mouth 4 (four) times daily as needed. 12/13/22  Yes Particia Nearing, PA-C  rosuvastatin (CRESTOR) 10 MG tablet Take 1 tablet (10 mg total) by mouth daily. 07/20/22  Yes Asa Lente, Tessa N, PA-C  warfarin (COUMADIN) 10 MG tablet Take 1 1/2 tablets daily as directed 11/16/21  Yes [name], MD  clonazePAM (KLONOPIN) 0.5 MG tablet Take 0.25-0.5 mg by mouth daily as needed. Patient not taking:  Reported on 11/29/2022 11/28/21   [provider]  lidocaine (LIDODERM) 5 % Place 1 patch onto the skin daily. Remove & Discard patch within 12 hours or as directed by MD 02/22/22   Particia Nearing, PA-C  ondansetron (ZOFRAN-ODT) 4 MG disintegrating tablet Take by mouth. Patient not taking: Reported on 11/29/2022 07/23/21   [provider]    Family History History reviewed. No pertinent family history.  Social History Social History   Tobacco Use   Smoking status: Every Day    Current packs/day: 1.50    Average packs/day: 1.5 packs/day for 30.0 years (45.0 ttl pk-yrs)    Types: Cigarettes   Smokeless tobacco: Never  Vaping Use   Vaping status: Never Used  Substance Use Topics   Alcohol use: Not Currently    Comment: occ   Drug use: Yes    Types: Marijuana    Comment: daily use     Allergies   Cocoa   Review of Systems Review of Systems Per HPI  Physical Exam Triage Vital Signs ED Triage Vitals  Encounter Vitals Group     BP 12/13/22 0918 119/71     Systolic BP Percentile --      Diastolic BP Percentile --      Pulse Rate 12/13/22 0918 62     Resp 12/13/22 0918 18     Temp 12/13/22 0918 98.6 F (37 C)     Temp Source 12/13/22 0918 Oral     SpO2 12/13/22 0918 97 %     Weight --      Height --      Head Circumference --      Peak Flow --      Pain Score 12/13/22 0919 0     Pain Loc --      Pain Education --      Exclude from Growth Chart --    No data found.  Updated Vital Signs BP 119/71 (BP Location: Right Arm)   Pulse 62   Temp 98.6 F (37 C) (Oral)   Resp 18   SpO2 97%   Visual Acuity Right Eye Distance:   Left Eye Distance:   Bilateral Distance:    Right Eye Near:   Left Eye Near:    Bilateral Near:     Physical Exam Vitals and nursing note reviewed.  Constitutional:      Appearance: He is well-developed.  HENT:     Head: Atraumatic.     Right Ear: Tympanic membrane and external ear normal.     Left Ear:  External ear normal.     Ears:     Comments: Left TM bulging, erythematous    Nose: Congestion present.     Mouth/Throat:     Pharynx: No oropharyngeal exudate.  Eyes:     Conjunctiva/sclera: Conjunctivae normal.     Pupils: Pupils are equal, round, and reactive to light.  Cardiovascular:     Rate and Rhythm: Normal rate and regular rhythm.  Pulmonary:  Effort: Pulmonary effort is normal. No respiratory distress.     Breath sounds: Wheezing present. No rales.  Musculoskeletal:        General: Normal range of motion.     Cervical back: Normal range of motion and neck supple.  Lymphadenopathy:     Cervical: No cervical adenopathy.  Skin:    General: Skin is warm and dry.  Neurological:     Mental Status: He is alert and oriented to person, place, and time.  Psychiatric:        Behavior: Behavior normal.      UC Treatments / Results  Labs (all labs ordered are listed, but only abnormal results are displayed) Labs Reviewed - No data to display  EKG   Radiology No results found.  Procedures Procedures (including critical care time)  Medications Ordered in UC Medications - No data to display  Initial Impression / Assessment and Plan / UC Course  I have reviewed the triage vital signs and the nursing notes.  Pertinent labs & imaging results that were available during my care of the patient were reviewed by me and considered in my medical decision making (see chart for details).     Treat with prednisone, Augmentin, Phenergan DM, albuterol inhaler and supportive over-the-counter medications and home care.  Return for worsening symptoms.  Final Clinical Impressions(s) / UC Diagnoses   Final diagnoses:  Acute suppurative otitis media of left ear without spontaneous rupture of tympanic membrane, recurrence not specified  COPD exacerbation [facility])   Discharge Instructions   None    ED Prescriptions     Medication Sig Dispense Auth. Provider   predniSONE  (DELTASONE) 20 MG tablet Take 2 tablets (40 mg total) by mouth daily with breakfast. 10 tablet Particia Nearing, PA-C   amoxicillin-clavulanate (AUGMENTIN) 875-125 MG tablet Take 1 tablet by mouth every 12 (twelve) hours. 14 tablet Particia Nearing, [place]   promethazine-dextromethorphan (PROMETHAZINE-DM) 6.25-15 MG/5ML syrup Take 5 mLs by mouth 4 (four) times daily as needed. 100 mL Particia Nearing, PA-C   albuterol (VENTOLIN HFA) 108 (90 Base) MCG/ACT inhaler Inhale 2 puffs into the lungs every 4 (four) hours as needed for wheezing or shortness of breath. 18 g Particia Nearing, [place]      PDMP not reviewed this encounter.   Particia Nearing, [place] 12/13/22 [id number]

## 2022-12-13 NOTE — ED Triage Notes (Signed)
Pt presents with chills, body aches, cough, runny nose, decreased taste and smell,  left ear pain and pressure that started a 10 days ago. Taking mucinex and tylenol with no relief.

## 2022-12-18 ENCOUNTER — Telehealth: Payer: Self-pay | Admitting: Physician Assistant

## 2022-12-18 ENCOUNTER — Other Ambulatory Visit: Payer: Self-pay | Admitting: Internal Medicine

## 2022-12-18 DIAGNOSIS — Z952 Presence of prosthetic heart valve: Secondary | ICD-10-CM

## 2022-12-18 MED ORDER — WARFARIN SODIUM 10 MG PO TABS
ORAL_TABLET | ORAL | 0 refills | Status: DC
Start: 2022-12-18 — End: 2022-12-18

## 2022-12-18 NOTE — Telephone Encounter (Signed)
*  STAT* If patient is at the pharmacy, call can be transferred to refill team.   1. Which medications need to be refilled? (please list name of each medication and dose if known) warfarin (COUMADIN) 10 MG tablet     4. Which pharmacy/location (including street and city if local pharmacy) is medication to be sent to?[id number] - [place], [place] - [address] AT [address]    5. Do they need a 30 day or 90 day supply? 90    Pt is completely out

## 2022-12-20 ENCOUNTER — Ambulatory Visit
Admission: EM | Admit: 2022-12-20 | Discharge: 2022-12-20 | Disposition: A | Discharge status: Home / Self Care | Payer: Medicaid Other | Attending: Nurse Practitioner | Admitting: Nurse Practitioner

## 2022-12-20 DIAGNOSIS — H6593 Unspecified nonsuppurative otitis media, bilateral: Secondary | ICD-10-CM | POA: Diagnosis not present

## 2022-12-20 DIAGNOSIS — J019 Acute sinusitis, unspecified: Secondary | ICD-10-CM | POA: Diagnosis not present

## 2022-12-20 MED ORDER — DOXYCYCLINE HYCLATE 100 MG PO TABS: 100.0000 mg | ORAL_TABLET | Freq: Two times a day (BID) | ORAL | 0 refills | Status: AC

## 2022-12-20 MED ORDER — PREDNISONE 20 MG PO TABS: 40.0000 mg | ORAL_TABLET | Freq: Every day | ORAL | 0 refills | Status: AC

## 2022-12-20 NOTE — ED Triage Notes (Addendum)
Pt c/o was seen here on 12/13/2022, post COVID (positive 3 weeks ago) , after doing all treatment pt states he is still not feeling 100% still has cough, nasal congestion, headache and ear pain.

## 2022-12-20 NOTE — ED Provider Notes (Signed)
[facility]    CSN: [id number] Arrival date & time: 12/20/22  1023      History   Chief Complaint No chief complaint on file.   HPI Scott [name] is a 47 y.o. male.   The history is provided by the patient.   Patient presents for continued headache, nasal congestion, sinus pressure, and bilateral ear pressure.  Patient was seen in this clinic on 12/13/2022.  Patient states that he is also continue to run a fever, last fever was last evening which was around 101.  Patient states that he completed all of the medication previously prescribed to include Augmentin, prednisone, and is continuing to use promethazine.  Patient states that he is also experiencing dizziness and lightheadedness.  Patient denies wheezing, shortness of breath, difficulty breathing, chest pain, abdominal pain, nausea, vomiting, or diarrhea.  Past Medical History:  Diagnosis Date   Anxiety    Aortic stenosis    Murmur, cardiac    Pancreatitis, acute 01/28/2017    Patient Active Problem List   Diagnosis Date Noted   Mixed hyperlipidemia 11/29/2022   Weak urinary stream 11/29/2022   Skin tag 11/22/2021   Ureterolithiasis 07/25/2021   Elevated alkaline phosphatase level 06/28/2021   Cluster headache 03/28/2021   Acute midline low back pain 03/28/2021   DOE (dyspnea on exertion) 01/11/2021   Chest pain of unknown etiology 01/11/2021   Encounter for general adult medical examination with abnormal findings 10/04/2020   S/P aortic valve replacement 10/04/2020   Sinusitis, chronic 10/04/2020   Chronic anticoagulation 11/20/2018   Subtherapeutic international normalized ratio (INR) 11/20/2018   PAF (paroxysmal atrial fibrillation) (HCC) 10/24/2018   RBBB (right bundle branch block) 10/22/2018   Angina, class II (HCC) 10/20/2018   Bullous emphysema (HCC) 10/20/2018   CHF (congestive heart failure), NYHA class II, chronic, diastolic (HCC) 10/20/2018   S/P repair of coarctation of aorta 10/20/2018    Subclavian artery stenosis, left (HCC) 10/20/2018   Tobacco user 10/19/2018   Anxiety 06/12/2018   Cervical radiculopathy at C6 06/12/2018   Panic attacks 06/12/2018   Marijuana user 01/28/2017    Past Surgical History:  Procedure Laterality Date   AORTIC VALVE REPAIR     when pt was 47 years old   CARDIAC SURGERY     FINGER SURGERY     MECHANICAL AORTIC VALVE REPLACEMENT     pt has card with mechanical valve replacemnt details   RIGHT/LEFT HEART CATH AND CORONARY ANGIOGRAPHY Bilateral 09/17/2018   Procedure: RIGHT/LEFT HEART CATH AND CORONARY ANGIOGRAPHY;  Surgeon: Marcina Millard, MD;  Location: [facility] INVASIVE CV [facility];  Service: Cardiovascular;  Laterality: Bilateral;       Home Medications    Prior to Admission medications   Medication Sig Start Date End Date Taking? Authorizing Provider  doxycycline (VIBRA-TABS) 100 MG tablet Take 1 tablet (100 mg total) by mouth 2 (two) times daily for 5 days. 12/20/22 12/25/22 Yes Edessa Jakubowicz-Warren, Sadie Haber, NP  predniSONE (DELTASONE) 20 MG tablet Take 2 tablets (40 mg total) by mouth daily with breakfast for 5 days. 12/20/22 12/25/22 Yes Roshni Burbano-Warren, Sadie Haber, NP  acetaminophen (TYLENOL) 325 MG tablet Take 650 mg by mouth every 6 (six) hours as needed.    [provider]  albuterol (VENTOLIN HFA) 108 (90 Base) MCG/ACT inhaler Inhale 2 puffs into the lungs every 4 (four) hours as needed for wheezing or shortness of breath. 12/13/22   Particia Nearing, PA-C  amoxicillin-clavulanate (AUGMENTIN) 875-125 MG tablet Take 1 tablet by mouth  every 12 (twelve) hours. 12/13/22   Particia Nearing, PA-C  cetirizine (ZYRTEC) 10 MG tablet Take 10 mg by mouth daily.    [provider]  hydrochlorothiazide (MICROZIDE) 12.5 MG capsule Take 1 capsule (12.5 mg total) by mouth daily. 06/07/22   Sharlene Dory, PA-C  lidocaine (LIDODERM) 5 % Place 1 patch onto the skin daily. Remove & Discard patch within 12 hours or as directed by MD  02/22/22   Particia Nearing, PA-C  metoprolol succinate (TOPROL-XL) 25 MG 24 hr tablet Take 0.5 tablets (12.5 mg total) by mouth daily. 07/20/22   Sharlene Dory, PA-C  promethazine-dextromethorphan (PROMETHAZINE-DM) 6.25-15 MG/5ML syrup Take 5 mLs by mouth 4 (four) times daily as needed. 12/13/22   Particia Nearing, PA-C  rosuvastatin (CRESTOR) 10 MG tablet Take 1 tablet (10 mg total) by mouth daily. 07/20/22   Sharlene Dory, PA-C  warfarin (COUMADIN) 10 MG tablet Take 1 tablet daily except 1 1/2 tablets on Mondays, Wednesdays and Fridays or as directed 12/18/22   [name], MD    Family History History reviewed. No pertinent family history.  Social History Social History   Tobacco Use   Smoking status: Every Day    Current packs/day: 1.50    Average packs/day: 1.5 packs/day for 30.0 years (45.0 ttl pk-yrs)    Types: Cigarettes   Smokeless tobacco: Never  Vaping Use   Vaping status: Never Used  Substance Use Topics   Alcohol use: Not Currently    Comment: occ   Drug use: Yes    Types: Marijuana    Comment: daily use     Allergies   Cocoa   Review of Systems Review of Systems Per HPI  Physical Exam Triage Vital Signs ED Triage Vitals  Encounter Vitals Group     BP 12/20/22 1028 116/64     Systolic BP Percentile --      Diastolic BP Percentile --      Pulse Rate 12/20/22 1028 66     Resp 12/20/22 1028 13     Temp 12/20/22 1028 98.2 F (36.8 C)     Temp Source 12/20/22 1028 Oral     SpO2 12/20/22 1028 97 %     Weight --      Height --      Head Circumference --      Peak Flow --      Pain Score 12/20/22 1031 6     Pain Loc --      Pain Education --      Exclude from Growth Chart --    No data found.  Updated Vital Signs BP 116/64 (BP Location: Right Arm)   Pulse 66   Temp 98.2 F (36.8 C) (Oral)   Resp 13   SpO2 97%   Visual Acuity Right Eye Distance:   Left Eye Distance:   Bilateral Distance:    Right Eye Near:   Left Eye Near:     Bilateral Near:     Physical Exam Vitals and nursing note reviewed.  Constitutional:      General: He is not in acute distress.    Appearance: Normal appearance.  HENT:     Head: Normocephalic.     Right Ear: Ear canal and external ear normal.     Left Ear: Ear canal and external ear normal.     Nose: Congestion present.     Right Turbinates: Enlarged and swollen.     Left Turbinates: Enlarged  and swollen.     Right Sinus: Maxillary sinus tenderness present.     Left Sinus: Maxillary sinus tenderness present.     Mouth/Throat:     Lips: Pink.     Mouth: Mucous membranes are moist.     Pharynx: Uvula midline. Posterior oropharyngeal erythema and postnasal drip present. No pharyngeal swelling.     Comments: Cobblestoning present to posterior oropharynx Eyes:     Extraocular Movements: Extraocular movements intact.     Conjunctiva/sclera: Conjunctivae normal.     Pupils: Pupils are equal, round, and reactive to light.  Cardiovascular:     Rate and Rhythm: Normal rate and regular rhythm.     Pulses: Normal pulses.     Heart sounds: Normal heart sounds.  Pulmonary:     Effort: Pulmonary effort is normal. No respiratory distress.     Breath sounds: Normal breath sounds. No stridor. No wheezing, rhonchi or rales.  Abdominal:     General: Bowel sounds are normal.     Palpations: Abdomen is soft.     Tenderness: There is no abdominal tenderness.  Musculoskeletal:     Cervical back: Normal range of motion.  Lymphadenopathy:     Cervical: No cervical adenopathy.  Skin:    General: Skin is warm and dry.  Neurological:     General: No focal deficit present.     Mental Status: He is alert and oriented to person, place, and time.  Psychiatric:        Mood and Affect: Mood normal.        Behavior: Behavior normal.      UC Treatments / Results  Labs (all labs ordered are listed, but only abnormal results are displayed) Labs Reviewed - No data to  display  EKG   Radiology No results found.  Procedures Procedures (including critical care time)  Medications Ordered in UC Medications - No data to display  Initial Impression / Assessment and Plan / UC Course  I have reviewed the triage vital signs and the nursing notes.  Pertinent labs & imaging results that were available during my care of the patient were reviewed by me and considered in my medical decision making (see chart for details).  The patient is well-appearing, he is in no acute distress, vital signs are stable.  On exam, patient continues to experience postnasal drip, and has maxillary sinus tenderness.  He does have bilateral middle ear effusions.  Will treat with another course of prednisone 40 mg for the next 5 days, for sinusitis, will treat with doxycycline 100 mg twice daily for the next 5 days.  Supportive care recommendations were provided and discussed with the patient to include over-the-counter Tylenol for pain or discomfort, continuing his current allergy medications, and use of normal saline nasal spray to help with nasal congestion and runny nose.  Patient was advised if symptoms do not improve with this treatment, it is recommended that he follow-up with his primary care physician for further evaluation.  Patient is in agreement with this plan of care and verbalizes understanding.  All questions were answered.  Patient stable for discharge.   Final Clinical Impressions(s) / UC Diagnoses   Final diagnoses:  Middle ear effusion, bilateral  Acute sinusitis, recurrence not specified, unspecified location     Discharge Instructions      Take medication as directed. Continue your current allergy medication regimen. Increase fluids and get plenty of rest. May take over-the-counter Tylenol as needed for pain, fever, or general discomfort.  Recommend normal saline nasal spray to help with nasal congestion throughout the day. For your cough, it may be  helpful to use a humidifier at bedtime during sleep. If symptoms do not improve with this treatment, it is recommended that you follow-up with your PCP for evaluation.      ED Prescriptions     Medication Sig Dispense Auth. Provider   doxycycline (VIBRA-TABS) 100 MG tablet Take 1 tablet (100 mg total) by mouth 2 (two) times daily for 5 days. 10 tablet Mosie Angus-Warren, Sadie Haber, NP   predniSONE (DELTASONE) 20 MG tablet Take 2 tablets (40 mg total) by mouth daily with breakfast for 5 days. 10 tablet Toshua Honsinger-Warren, Sadie Haber, NP      PDMP not reviewed this encounter.   Abran Cantor, NP 12/20/22 1056

## 2022-12-20 NOTE — Discharge Instructions (Addendum)
Take medication as directed. Continue your current allergy medication regimen. Increase fluids and get plenty of rest. May take over-the-counter Tylenol as needed for pain, fever, or general discomfort. Recommend normal saline nasal spray to help with nasal congestion throughout the day. For your cough, it may be helpful to use a humidifier at bedtime during sleep. If symptoms do not improve with this treatment, it is recommended that you follow-up with your PCP for evaluation.

## 2022-12-31 ENCOUNTER — Other Ambulatory Visit: Payer: Self-pay | Admitting: Internal Medicine

## 2023-01-04 ENCOUNTER — Other Ambulatory Visit: Payer: Self-pay | Admitting: Family Medicine

## 2023-01-16 ENCOUNTER — Ambulatory Visit (HOSPITAL_COMMUNITY): Payer: Medicaid Other | Attending: Physician Assistant

## 2023-01-16 ENCOUNTER — Encounter (HOSPITAL_COMMUNITY): Payer: Self-pay | Admitting: Physician Assistant

## 2023-01-22 NOTE — Progress Notes (Deleted)
Office Visit    Patient Name: Scott [name] Date of Encounter: 01/22/2023  PCP:  Scott Halon, MD   [facility]  Cardiologist:  Scott Pates, MD  Advanced Practice Provider:  No care team member to display Electrophysiologist:  None   HPI    Scott [name] is a 47 y.o. 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 Scott [name] ([facility]) and Scott [name], cardiac catheterization presurgery in 2020 showed no significant CAD presents today for follow-up appointment.  Seen January 2022 Scott [name]) with some left-sided chest pain at that time.  Complained of some dyspnea.  Metoprolol was cut back.  High-resolution CT ordered without any significant abnormalities.  Pulmonary appointment was arranged.  He was seen by Dr. Tenny [name] September 2022 and had run out of Coumadin at that time.  Complained of intermittent chest pain.    Last seen 07/05/2021 by Dr. Tenny [name] and patient states that he gets "gassed" whenever he tries to do things.  Endorsed some shortness of breath.  Denied chest pain, dizziness, palpitations.  He was back on his Coumadin.  He needed to have dental work done at that time.  He was seen by me 06/07/22, he tells me that he went back to work 3 months ago and when he started working he does not take a lunch.  He has palpitations and shortness of breath.  He runs a [employer] and his job requires lots of heavy lifting.  He has not had his INR checked since November.  He has 5 kids that keep him really busy.  He also ran out of his HCTZ and has noticed some swelling in his hands.  He was seen by me 07/2022, he stil is having a lot of symptoms.  He states he is back to work but is exhausted at the end of the workday.  He does a lot of  manual labor. He has 5 kids and does not have the energy at night to play with them or do much other than go to bed. He has lost a lot of weight (around 30 lbs) and cannot gain it back. He has not done much physical activity. He states he will get lightheaded and dizzy at times. His left arm goes numb and he has had some intermittent chest pain. There is a big difference between his blood pressures in his arms, but he says this is usual for him due to his coarctation of his aorta. Unclear if this was repaired back in 2020. Per the records it was but family states it was not. Since his surgery was done at [facility], I cannot pull the op note.  Today, he *** CTA of chest and neck needed  Past Medical History    Past Medical History:  Diagnosis Date   Anxiety    Aortic stenosis    Murmur, cardiac    Pancreatitis, acute 01/28/2017   Past Surgical History:  Procedure Laterality Date   AORTIC VALVE REPAIR     when pt was 47 years old   CARDIAC SURGERY     FINGER SURGERY     MECHANICAL AORTIC VALVE REPLACEMENT     pt has card with mechanical valve replacemnt details   RIGHT/LEFT HEART CATH AND CORONARY ANGIOGRAPHY Bilateral 09/17/2018  Procedure: RIGHT/LEFT HEART CATH AND CORONARY ANGIOGRAPHY;  Surgeon: Scott Millard, MD;  Location: [facility];  Service: Cardiovascular;  Laterality: Bilateral;    Allergies  Allergies  Allergen Reactions   Cocoa Hives    Pt sts he is allergic to [facility]     EKGs/Labs/Other Studies Reviewed:   The following studies were reviewed today: Echo   06/28/21   Left ventricular ejection fraction, by estimation, is 65 to 70%. The left ventricle has normal function. The left ventricle has no regional wall motion abnormalities. There is mild left ventricular hypertrophy. Left ventricular diastolic parameters were normal. 1. Right ventricular systolic function is normal. The right ventricular size is normal. There is normal pulmonary artery  systolic pressure. The estimated right ventricular systolic pressure is 21.3 mmHg. 2. 3. The mitral valve is grossly normal. Trivial mitral valve regurgitation. The aortic valve has been repaired/replaced. Aortic valve regurgitation is trivial. There is a 21 mm [facility] [name] mechanical valve present in the aortic position. Procedure Date: 11/03/18. Aortic valve mean gradient measures 17.2 mmHg. 4. 5. Aortic arch not well visualized. The inferior vena cava is normal in size with greater than 50% respiratory variability, suggesting right atrial pressure of 3 mmHg. 6. Comparison(s): Prior images unable to be directly viewed.  EKG:  EKG is  ordered today.  The ekg ordered today demonstrates old right bundle branch block, normal sinus rhythm rate 66 bpm  Recent Labs: 06/07/2022: Magnesium 2.3 08/26/2022: BUN 14; Creatinine, Ser 0.93; Hemoglobin 13.8; Platelets 248; Potassium 4.0; Sodium 138  Recent Lipid Panel    Component Value Date/Time   CHOL 121 11/29/2022 0947   TRIG 76 11/29/2022 0947   HDL 47 11/29/2022 0947   CHOLHDL 2.6 11/29/2022 0947   CHOLHDL 4.8 12/30/2020 1537   VLDL 14 12/30/2020 1537   LDLCALC 59 11/29/2022 0947     Home Medications   No outpatient medications have been marked as taking for the 01/23/23 encounter (Appointment) with Sharlene Dory, PA-C.     Review of Systems      All other systems reviewed and are otherwise negative except as noted above.  Physical Exam    VS:  There were no vitals taken for this visit. , BMI There is no height or weight on file to calculate BMI.  Wt Readings from Last 3 Encounters:  11/29/22 156 lb 12.8 oz (71.1 kg)  11/14/22 155 lb (70.3 kg)  08/26/22 159 lb 9.8 oz (72.4 kg)     GEN: Well nourished, well developed, in no acute distress. HEENT: normal. Neck: Supple, no JVD, carotid bruits, or masses. Cardiac: RRR, no murmurs, rubs, or gallops. No clubbing, cyanosis, edema.  Radials/PT 2+ and equal bilaterally.  Respiratory:   Respirations regular and unlabored, clear to auscultation bilaterally. GI: Soft, nontender, nondistended. MS: No deformity or atrophy. Skin: Warm and dry, no rash. Neuro:  Strength and sensation are intact. Psych: Normal affect.  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 Tenny [name] aware  -blood pressure difference in his arms: 120/60 (r) and 90/70 (l) this has been the case since surgery in 2020  Dyspnea/fatigue/lightheaded/chest pain -decrease metoprolol for more BP room, HR 64 -monitor negative (07/10/22) -will order carotid US -labs ordered including an INR -refill medications -discussed proper hydration -encourage follow-up with [facility]  Hyperlipidemia -LDL  80, triglycerides 119 -due  for repeat labs the next time he comes in -continue current medications  Tobacco abuse -cessation advised  ADDENDUM: Discussed case with Dr. Sherryll Burger, peer-to-peer cardiologist. Patient is SOB with activity and is ambulatory. We have canceled the [facility] and would like to assess the patient's hemodynamics with exercise and stress echo would be the most appropriate test for that evaluation. Mean gradient has increased on recent echo from March 2024  ( to ). Peak gradient was measured at 43.6 mmHg. We will await appeal. Our office can provide documentation that the Lexiscan Myoview was never preformed if needed.     Disposition: Follow up 6 weeks with Scott Pates, MD or APP.  Signed, Sharlene Dory, PA-C 01/22/2023, 3:38 PM [facility]

## 2023-01-23 ENCOUNTER — Ambulatory Visit: Payer: Medicaid Other | Attending: Physician Assistant | Admitting: Physician Assistant

## 2023-01-23 DIAGNOSIS — Z6823 Body mass index (BMI) 23.0-23.9, adult: Secondary | ICD-10-CM | POA: Diagnosis not present

## 2023-01-23 DIAGNOSIS — R0609 Other forms of dyspnea: Secondary | ICD-10-CM

## 2023-01-23 DIAGNOSIS — R42 Dizziness and giddiness: Secondary | ICD-10-CM

## 2023-01-23 DIAGNOSIS — E785 Hyperlipidemia, unspecified: Secondary | ICD-10-CM

## 2023-01-23 DIAGNOSIS — R030 Elevated blood-pressure reading, without diagnosis of hypertension: Secondary | ICD-10-CM | POA: Diagnosis not present

## 2023-01-23 DIAGNOSIS — J209 Acute bronchitis, unspecified: Secondary | ICD-10-CM | POA: Diagnosis not present

## 2023-01-23 DIAGNOSIS — Z952 Presence of prosthetic heart valve: Secondary | ICD-10-CM

## 2023-01-24 ENCOUNTER — Encounter: Payer: Self-pay | Admitting: Physician Assistant

## 2023-02-14 ENCOUNTER — Ambulatory Visit (HOSPITAL_COMMUNITY): Payer: Medicaid Other

## 2023-03-21 ENCOUNTER — Ambulatory Visit (HOSPITAL_COMMUNITY): Payer: Medicaid Other

## 2023-04-15 ENCOUNTER — Encounter: Payer: Self-pay | Admitting: Internal Medicine

## 2023-04-18 ENCOUNTER — Ambulatory Visit: Payer: Medicaid Other | Admitting: Cardiology

## 2023-04-19 ENCOUNTER — Encounter (HOSPITAL_COMMUNITY): Payer: Self-pay | Admitting: Physician Assistant

## 2023-04-19 ENCOUNTER — Ambulatory Visit (HOSPITAL_COMMUNITY): Payer: Medicaid Other | Attending: Physician Assistant

## 2023-05-16 ENCOUNTER — Ambulatory Visit: Payer: Self-pay | Admitting: Family Medicine

## 2023-05-27 ENCOUNTER — Encounter: Payer: Self-pay | Admitting: Internal Medicine

## 2023-05-31 ENCOUNTER — Ambulatory Visit: Payer: Medicaid Other | Admitting: Internal Medicine

## 2023-06-03 ENCOUNTER — Encounter (INDEPENDENT_AMBULATORY_CARE_PROVIDER_SITE_OTHER): Payer: Self-pay | Admitting: *Deleted

## 2023-06-13 ENCOUNTER — Encounter: Payer: Self-pay | Admitting: Internal Medicine

## 2023-06-26 ENCOUNTER — Other Ambulatory Visit: Payer: Self-pay

## 2023-06-26 ENCOUNTER — Emergency Department (HOSPITAL_COMMUNITY)
Admission: EM | Admit: 2023-06-26 | Discharge: 2023-06-26 | Disposition: A | Discharge status: Home / Self Care | Attending: Student | Admitting: Student

## 2023-06-26 ENCOUNTER — Emergency Department (HOSPITAL_COMMUNITY)

## 2023-06-26 ENCOUNTER — Encounter (HOSPITAL_COMMUNITY): Payer: Self-pay

## 2023-06-26 DIAGNOSIS — I509 Heart failure, unspecified: Secondary | ICD-10-CM | POA: Diagnosis not present

## 2023-06-26 DIAGNOSIS — R079 Chest pain, unspecified: Secondary | ICD-10-CM | POA: Diagnosis not present

## 2023-06-26 DIAGNOSIS — R0789 Other chest pain: Secondary | ICD-10-CM | POA: Diagnosis not present

## 2023-06-26 DIAGNOSIS — J0100 Acute maxillary sinusitis, unspecified: Secondary | ICD-10-CM | POA: Insufficient documentation

## 2023-06-26 DIAGNOSIS — R7989 Other specified abnormal findings of blood chemistry: Secondary | ICD-10-CM | POA: Insufficient documentation

## 2023-06-26 DIAGNOSIS — Z7901 Long term (current) use of anticoagulants: Secondary | ICD-10-CM | POA: Diagnosis not present

## 2023-06-26 DIAGNOSIS — R0602 Shortness of breath: Secondary | ICD-10-CM | POA: Diagnosis not present

## 2023-06-26 LAB — COMPREHENSIVE METABOLIC PANEL
ALT: 24 U/L (ref 0–44)
AST: 20 U/L (ref 15–41)
Albumin: 4.4 g/dL (ref 3.5–5.0)
Alkaline Phosphatase: 68 U/L (ref 38–126)
Anion gap: 9 (ref 5–15)
BUN: 19 mg/dL (ref 6–20)
CO2: 25 mmol/L (ref 22–32)
Calcium: 9.6 mg/dL (ref 8.9–10.3)
Chloride: 104 mmol/L (ref 98–111)
Creatinine, Ser: 1.18 mg/dL (ref 0.61–1.24)
GFR, Estimated: >60 mL/min (ref >60)
Glucose, Bld: 105 mg/dL — ABNORMAL HIGH (ref 70–99)
Potassium: 3.7 mmol/L (ref 3.5–5.1)
Sodium: 138 mmol/L (ref 135–145)
Total Bilirubin: 0.5 mg/dL (ref 0.0–1.2)
Total Protein: 7.3 g/dL (ref 6.5–8.1)

## 2023-06-26 LAB — CBC WITH DIFFERENTIAL/PLATELET
Abs Immature Granulocytes: 0.03 10*3/uL (ref 0.00–0.07)
Basophils Absolute: 0.1 10*3/uL (ref 0.0–0.1)
Basophils Relative: 1 %
Eosinophils Absolute: 0.3 10*3/uL (ref 0.0–0.5)
Eosinophils Relative: 3 %
HCT: 40.5 % (ref 39.0–52.0)
Hemoglobin: 13.6 g/dL (ref 13.0–17.0)
Immature Granulocytes: 0 %
Lymphocytes Relative: 26 %
Lymphs Abs: 2.3 10*3/uL (ref 0.7–4.0)
MCH: 30.9 pg (ref 26.0–34.0)
MCHC: 33.6 g/dL (ref 30.0–36.0)
MCV: 92 fL (ref 80.0–100.0)
Monocytes Absolute: 0.8 10*3/uL (ref 0.1–1.0)
Monocytes Relative: 9 %
Neutro Abs: 5.6 10*3/uL (ref 1.7–7.7)
Neutrophils Relative %: 61 %
Platelets: 255 10*3/uL (ref 150–400)
RBC: 4.4 MIL/uL (ref 4.22–5.81)
RDW: 12.6 % (ref 11.5–15.5)
WBC: 9 10*3/uL (ref 4.0–10.5)
nRBC: 0 % (ref 0.0–0.2)

## 2023-06-26 LAB — RESP PANEL BY RT-PCR (RSV, FLU A&B, COVID)  RVPGX2
Influenza A by PCR: NEGATIVE
Influenza B by PCR: NEGATIVE
Resp Syncytial Virus by PCR: NEGATIVE
SARS Coronavirus 2 by RT PCR: NEGATIVE

## 2023-06-26 LAB — LACTIC ACID, PLASMA
Lactic Acid, Venous: 0.8 mmol/L (ref 0.5–1.9)
Lactic Acid, Venous: 0.9 mmol/L (ref 0.5–1.9)

## 2023-06-26 LAB — TROPONIN I (HIGH SENSITIVITY)
Troponin I (High Sensitivity): 10 ng/L (ref <18)
Troponin I (High Sensitivity): 8 ng/L (ref <18)

## 2023-06-26 LAB — BRAIN NATRIURETIC PEPTIDE: B Natriuretic Peptide: 121 pg/mL — ABNORMAL HIGH (ref 0.0–100.0)

## 2023-06-26 LAB — D-DIMER, QUANTITATIVE: D-Dimer, Quant: <0.27 ug{FEU}/mL (ref 0.00–0.50)

## 2023-06-26 MED ORDER — ONDANSETRON HCL 4 MG/2ML IJ SOLN
4.0000 mg | Freq: Once | INTRAMUSCULAR | Status: AC
  Administered 2023-06-26: 4 mg via INTRAVENOUS
  Filled 2023-06-26: qty 2

## 2023-06-26 MED ORDER — SODIUM CHLORIDE 0.9 % IV BOLUS
500.0000 mL | Freq: Once | INTRAVENOUS | Status: AC
  Administered 2023-06-26: 500 mL via INTRAVENOUS

## 2023-06-26 MED ORDER — AMOXICILLIN-POT CLAVULANATE 875-125 MG PO TABS: 1.0000 | ORAL_TABLET | Freq: Two times a day (BID) | ORAL | 0 refills | Status: DC

## 2023-06-26 NOTE — ED Triage Notes (Signed)
 Pt arrived via POV c/o chest pain with radiation to left neck/jaw X 2 days. Pt endorses nausea w/o emesis and dizziness.

## 2023-06-26 NOTE — ED Provider Notes (Signed)
 [place] EMERGENCY DEPARTMENT AT [facility] Provider Note   CSN: [id number] Arrival date & time: 06/26/23  8295     History  No chief complaint on file.   Scott [name] is a 48 y.o. male.  HPI     Scott [name] is a 48 y.o. male with past medical history of aortic valve replacement with mechanical valve, anticoagulated, CHF, paroxysmal atrial fibrillation, who presents to the Emergency Department complaining of sinus pressure, pain and nasal congestion for several days.  He was having tightness of his left neck, left face and left dental pain.  He takes warfarin daily and stopped his warfarin so that he could take ibuprofen 2-3 days ago.  Began having pain tightness of his left chest today as well.  Occasional cough that is mostly nonproductive.  Some bodyaches , no definite fever chills.  Shortness of breath mostly exertional and describes having orthopnea as well.  Feels as though his legs are swelling.  takes diuretic, (hydrochlorothiazide).  Some nausea vomiting.  No abdominal pain or diarrhea.  No flank pain or dysuria.  Home Medications Prior to Admission medications   Medication Sig Start Date End Date Taking? Authorizing Provider  acetaminophen (TYLENOL) 325 MG tablet Take 650 mg by mouth every 6 (six) hours as needed.    [provider]  albuterol (VENTOLIN HFA) 108 (90 Base) MCG/ACT inhaler INHALE 2 PUFFS INTO THE LUNGS EVERY 4 HOURS AS NEEDED FOR WHEEZING OR SHORTNESS OF BREATH. 01/07/23   Anabel Halon, MD  amoxicillin-clavulanate (AUGMENTIN) 875-125 MG tablet Take 1 tablet by mouth every 12 (twelve) hours. 12/13/22   Particia Nearing, PA-C  cetirizine (ZYRTEC) 10 MG tablet Take 10 mg by mouth daily.    [provider]  hydrochlorothiazide (MICROZIDE) 12.5 MG capsule Take 1 capsule (12.5 mg total) by mouth daily. 06/07/22   Sharlene Dory, PA-C  lidocaine (LIDODERM) 5 % Place 1 patch onto the skin daily. Remove & Discard patch within 12  hours or as directed by MD 02/22/22   Particia Nearing, PA-C  metoprolol succinate (TOPROL-XL) 25 MG 24 hr tablet TAKE 1 TABLET (25 MG TOTAL) BY MOUTH DAILY. 12/31/22   Sharlene Dory, PA-C  promethazine-dextromethorphan (PROMETHAZINE-DM) 6.25-15 MG/5ML syrup Take 5 mLs by mouth 4 (four) times daily as needed. 12/13/22   Particia Nearing, PA-C  rosuvastatin (CRESTOR) 10 MG tablet Take 1 tablet (10 mg total) by mouth daily. 07/20/22   Sharlene Dory, PA-C  warfarin (COUMADIN) 10 MG tablet Take 1 tablet daily except 1 1/2 tablets on Mondays, Wednesdays and Fridays or as directed 12/18/22   [name], MD      Allergies    Cocoa    Review of Systems   Review of Systems  Constitutional:  Negative for appetite change, chills and fever.  HENT:  Positive for congestion, sinus pressure and sinus pain. Negative for sore throat and trouble swallowing.   Respiratory:  Positive for cough. Negative for shortness of breath.   Cardiovascular:  Positive for chest pain.  Gastrointestinal:  Positive for nausea. Negative for abdominal pain, diarrhea and vomiting.  Genitourinary:  Negative for dysuria.  Musculoskeletal:  Positive for neck pain. Negative for back pain and neck stiffness.  Neurological:  Negative for dizziness, weakness and headaches.    Physical Exam Updated Vital Signs BP 92/63 (BP Location: Left Arm)   Pulse (!) 57   Temp 97.9 F (36.6 C) (Oral)   Resp (!) 6  SpO2 99%  Physical Exam Vitals and nursing note reviewed.  Constitutional:      Appearance: Normal appearance.  HENT:     Right Ear: Tympanic membrane and ear canal normal.     Left Ear: Tympanic membrane normal.     Nose:     Right Sinus: Maxillary sinus tenderness present.     Left Sinus: Maxillary sinus tenderness present.     Mouth/Throat:     Mouth: Mucous membranes are moist.     Pharynx: Oropharynx is clear. No oropharyngeal exudate or posterior oropharyngeal erythema.  Neck:     Vascular: No JVD.   Cardiovascular:     Rate and Rhythm: Normal rate and regular rhythm.     Pulses: Normal pulses.  Pulmonary:     Effort: Pulmonary effort is normal.     Breath sounds: Normal breath sounds.  Abdominal:     Palpations: Abdomen is soft.     Tenderness: There is no abdominal tenderness.  Musculoskeletal:        General: No tenderness. Normal range of motion.     Cervical back: Normal range of motion.     Right lower leg: No edema.     Left lower leg: No edema.  Lymphadenopathy:     Cervical: No cervical adenopathy.  Skin:    General: Skin is warm.     Capillary Refill: Capillary refill takes less than 2 seconds.     Findings: No erythema.  Neurological:     General: No focal deficit present.     Mental Status: He is alert.     Sensory: No sensory deficit.     Motor: No weakness.     ED Results / Procedures / Treatments   Labs (all labs ordered are listed, but only abnormal results are displayed) Labs Reviewed  COMPREHENSIVE METABOLIC PANEL - Abnormal; Notable for the following components:      Result Value   Glucose, Bld 105 (*)    All other components within normal limits  BRAIN NATRIURETIC PEPTIDE - Abnormal; Notable for the following components:   B Natriuretic Peptide 121.0 (*)    All other components within normal limits  RESP PANEL BY RT-PCR (RSV, FLU A&B, COVID)  RVPGX2  CBC WITH DIFFERENTIAL/PLATELET  LACTIC ACID, PLASMA  LACTIC ACID, PLASMA  D-DIMER, QUANTITATIVE  TROPONIN I (HIGH SENSITIVITY)  TROPONIN I (HIGH SENSITIVITY)    EKG None  Radiology DG Chest Portable 1 View Result Date: 06/26/2023 CLINICAL DATA:  Chest pain.  Shortness of breath. EXAM: PORTABLE CHEST 1 VIEW COMPARISON:  02/22/2022. FINDINGS: Redemonstration of ill-defined right hilar prominence, which corresponds to atelectatic changes on the prior CT scan. Bilateral lung fields are otherwise clear. No acute consolidation or lung collapse. Bilateral costophrenic angles are clear. Stable  cardio-mediastinal silhouette. Sternotomy wires noted. No acute osseous abnormalities. The soft tissues are within normal limits. IMPRESSION: No active disease. Electronically Signed   By: Jules Schick M.D.   On: 06/26/2023 11:25    Procedures Procedures    Medications Ordered in ED Medications - No data to display  ED Course/ Medical Decision Making/ A&P                                 Medical Decision Making Pt here with chest pain, SOB, orthopnea for 2 days.  Prior to his chest pain, he was having sinus congestion pressure and pain.  He also endorses some cough  possible fever at home but he is unsure.  Initially his symptoms were unrelieved with Tylenol so he stopped taking his warfarin so that he could take ibuprofen.  He also feels that his lower legs are swollen.   Differential would include but not limited to ACS, CHF exacerbation, PE, infectious process pneumonia is also a consideration and viral process   On review of medical records, patient had negative stress echocardiogram for ischemia 10/2022.  EF was 60%   Amount and/or Complexity of Data Reviewed Labs: ordered.    Details: Labs overall reassuring.  No leukocytosis, chemistries without derangement.  BNP mildly elevated at 121.  Respiratory panel is negative.  Dimer reassuring.  Lactic acid also reassuring.  Delta troponin unchanged Radiology: ordered.    Details: Chest x-ray without evidence of active disease ECG/medicine tests:     Details: EKG shows normal sinus with right bundle branch block Discussion of management or test interpretation with external provider(s):   Patient well-appearing here.  Likely sinusitis.  Will treat with antibiotic.  Cardiac workup reassuring.  Reassuring D-dimer as well.  Patient endorses lower extremity swelling and has a slightly elevated BNP.  No effusion or edema on chest x-ray, he does take 12.5 hydrochlorothiazide daily we will have him increase his dose to 25 mg for 3 days then  resume normal dosing.  He will follow-up closely outpatient with PCP for recheck.  Return precautions were given  Risk Prescription drug management.           Final Clinical Impression(s) / ED Diagnoses Final diagnoses:  Acute maxillary sinusitis, recurrence not specified    Rx / DC Orders ED Discharge Orders     None         Pauline Aus, PA-C 06/26/23 1247    Kommor, [place], MD 06/26/23 2025

## 2023-06-26 NOTE — Discharge Instructions (Signed)
 Stop the ibuprofen and start your warfarin back.  You have been prescribed antibiotics to help with your sinus infection, please take as directed until finished.  You may increase your hydrochlorothiazide dose to 25 mg once a day for 3 days to help with your swelling.  Returned back to your normal dosing after 3 days.  Follow-up with your primary care provider for recheck early next week.  Return to the emergency department if you develop any new or worsening symptoms.

## 2023-07-08 ENCOUNTER — Encounter (HOSPITAL_BASED_OUTPATIENT_CLINIC_OR_DEPARTMENT_OTHER): Payer: Self-pay | Admitting: Family Medicine

## 2023-07-08 ENCOUNTER — Ambulatory Visit: Payer: Self-pay | Admitting: Internal Medicine

## 2023-07-08 ENCOUNTER — Other Ambulatory Visit (HOSPITAL_BASED_OUTPATIENT_CLINIC_OR_DEPARTMENT_OTHER): Payer: Self-pay

## 2023-07-08 ENCOUNTER — Ambulatory Visit (INDEPENDENT_AMBULATORY_CARE_PROVIDER_SITE_OTHER): Admitting: Family Medicine

## 2023-07-08 VITALS — BP 133/88 | HR 78 | Ht 70.0 in | Wt 159.0 lb

## 2023-07-08 DIAGNOSIS — K047 Periapical abscess without sinus: Secondary | ICD-10-CM | POA: Diagnosis not present

## 2023-07-08 DIAGNOSIS — J019 Acute sinusitis, unspecified: Secondary | ICD-10-CM | POA: Diagnosis not present

## 2023-07-08 MED ORDER — FLUTICASONE PROPIONATE 50 MCG/ACT NA SUSP
2.0000 | Freq: Every day | NASAL | 6 refills | Status: DC
  Filled 2023-07-08: qty 16, 30d supply, fill #0

## 2023-07-08 MED ORDER — DOXYCYCLINE HYCLATE 100 MG PO TABS
100.0000 mg | ORAL_TABLET | Freq: Two times a day (BID) | ORAL | 0 refills | Status: DC
  Filled 2023-07-08: qty 14, 7d supply, fill #0

## 2023-07-08 MED ORDER — TRAMADOL HCL 50 MG PO TABS
50.0000 mg | ORAL_TABLET | Freq: Three times a day (TID) | ORAL | 0 refills | Status: AC | PRN
Start: 2023-07-08 — End: 2023-07-13
  Filled 2023-07-08: qty 15, 5d supply, fill #0

## 2023-07-08 NOTE — Progress Notes (Signed)
 Acute Care Office Visit  Subjective:   Scott [name] [birth year] 07/08/2023  Chief Complaint  Patient presents with   New Patient (Initial Visit)    Patient is here today to get established with the practice. Was recently seen in the ED. Pt has been having sinus pain and pain in teeth for the past 3 weeks and even being on abx, the pain is not going away.    HPI: Patient is a 48 year old male with history of CHF NYHA class II, PAF, status post aortic valve replacement on chronic anticoagulation, HLD who presents for acute care visit today due to ongoing sinusitis and dental pain. Patient reports symptom onset 3 week sago w/ left sided facial, jaw and sinus pain.  Reports nasal congestion, sinus pressure, postnasal drip and headaches.  He went ot the ER on 06/26/2023 and was placed on Augmentin without improvement. He states he went to his dentist on 07/01/2023 and was recommended to take Amoxicillin. He started amoxicillin on 07/05/2023 and is scheduled for 5 extractions of teeth in the next few weeks. He states pain is only relieved slighlty by rinsing with cold water. He is using Ibuprofen, Tylenol and Advil. He does have a mechanical heart valve and did stop Warfarin due to use of NSAIDS.    The following portions of the patient's history were reviewed and updated as appropriate: past medical history, past surgical history, family history, social history, allergies, medications, and problem list.   Patient Active Problem List   Diagnosis Date Noted   Mixed hyperlipidemia 11/29/2022   Weak urinary stream 11/29/2022   Skin tag 11/22/2021   Ureterolithiasis 07/25/2021   Elevated alkaline phosphatase level 06/28/2021   Cluster headache 03/28/2021   Acute midline low back pain 03/28/2021   DOE (dyspnea on exertion) 01/11/2021   Chest pain of unknown etiology 01/11/2021   Encounter for general adult medical examination with abnormal findings 10/04/2020   S/P aortic valve  replacement 10/04/2020   Sinusitis, chronic 10/04/2020   Chronic anticoagulation 11/20/2018   Subtherapeutic international normalized ratio (INR) 11/20/2018   PAF (paroxysmal atrial fibrillation) (HCC) 10/24/2018   RBBB (right bundle branch block) 10/22/2018   Angina, class II (HCC) 10/20/2018   Bullous emphysema (HCC) 10/20/2018   CHF (congestive heart failure), NYHA class II, chronic, diastolic (HCC) 10/20/2018   S/P repair of coarctation of aorta 10/20/2018   Subclavian artery stenosis, left (HCC) 10/20/2018   Tobacco user 10/19/2018   Anxiety 06/12/2018   Cervical radiculopathy at C6 06/12/2018   Panic attacks 06/12/2018   Marijuana user 01/28/2017   Past Medical History:  Diagnosis Date   Anxiety    Aortic stenosis    Murmur, cardiac    Pancreatitis, acute 01/28/2017   Past Surgical History:  Procedure Laterality Date   AORTIC VALVE REPAIR     when pt was 48 years old   CARDIAC SURGERY     FINGER SURGERY     MECHANICAL AORTIC VALVE REPLACEMENT     pt has card with mechanical valve replacemnt details   RIGHT/LEFT HEART CATH AND CORONARY ANGIOGRAPHY Bilateral 09/17/2018   Procedure: RIGHT/LEFT HEART CATH AND CORONARY ANGIOGRAPHY;  Surgeon: Marcina Millard, MD;  Location: [facility] INVASIVE CV LAB;  Service: Cardiovascular;  Laterality: Bilateral;   Family History  Problem Relation Age of Onset   Heart disease Father    Heart disease Brother    Outpatient Medications Prior to Visit  Medication Sig Dispense Refill   acetaminophen (TYLENOL) 325 MG tablet  Take 650 mg by mouth every 6 (six) hours as needed.     albuterol (VENTOLIN HFA) 108 (90 Base) MCG/ACT inhaler INHALE 2 PUFFS INTO THE LUNGS EVERY 4 HOURS AS NEEDED FOR WHEEZING OR SHORTNESS OF BREATH. 18 each 1   cetirizine (ZYRTEC) 10 MG tablet Take 10 mg by mouth daily.     hydrochlorothiazide (MICROZIDE) 12.5 MG capsule Take 1 capsule (12.5 mg total) by mouth daily. 90 capsule 3   ibuprofen (ADVIL) 200 MG tablet Take 4  tablets by mouth every 6 (six) hours as needed.     metoprolol succinate (TOPROL-XL) 25 MG 24 hr tablet TAKE 1 TABLET (25 MG TOTAL) BY MOUTH DAILY. 90 tablet 3   rosuvastatin (CRESTOR) 10 MG tablet Take 1 tablet (10 mg total) by mouth daily. 90 tablet 3   warfarin (COUMADIN) 10 MG tablet Take 1 tablet daily except 1 1/2 tablets on Mondays, Wednesdays and Fridays or as directed 45 tablet 5   amoxicillin (AMOXIL) 500 MG capsule Take 500 mg by mouth 3 (three) times daily.     amoxicillin-clavulanate (AUGMENTIN) 875-125 MG tablet Take 1 tablet by mouth every 12 (twelve) hours. 14 tablet 0   No facility-administered medications prior to visit.   Allergies  Allergen Reactions   Cocoa Hives    Pt sts he is allergic to Coco Puffs     ROS: A complete ROS was performed with pertinent positives/negatives noted in the HPI. The remainder of the ROS are negative.    Objective:   Today's Vitals   07/08/23 1016  BP: 133/88  Pulse: 78  SpO2: 100%  Weight: 159 lb (72.1 kg)  Height: 5\' 10"  (1.778 m)    GENERAL: Well-appearing, in NAD. Well nourished.  SKIN: Pink, warm and dry.  Head: Normocephalic. NECK: Trachea midline. Full ROM w/o pain or tenderness. Mild left anterior cervical lymphadenopathy.  EARS: Tympanic membranes are intact, translucent without bulging and without drainage. Appropriate landmarks visualized.  EYES: Conjunctiva clear without exudates. EOMI, PERRL, no drainage present.  NOSE: Septum midline w/o deformity. Nares patent, mucosa pink and mildly inflamed w/ cloudy drainage. Frontal and maxillary sinus tenderness.  THROAT: Uvula midline. Oropharynx clear.  Mucous membranes pink and moist.  Severe dental caries present. RESPIRATORY: Chest wall symmetrical. Respirations even and non-labored. Breath sounds clear to auscultation bilaterally.  CARDIAC: S1, S2 present, regular rate and rhythm without murmur or gallops.  EXTREMITIES: Without clubbing, cyanosis, or edema.  NEUROLOGIC:  No motor or sensory deficits. Steady, even gait. C2-C12 intact.  PSYCH/MENTAL STATUS: Alert, oriented x 3. Cooperative, appropriate mood and affect.    No results found for any visits on 07/08/23.    Assessment & Plan:  1. Acute sinusitis, recurrence not specified, unspecified location (Primary) Patient to stop amoxicillin and switch to doxycycline 1 tablet twice a day for 7 days.  Recommend he start using Flonase twice a day and saline sinus rinses.  Will provide tramadol for relief of dental and sinus pain as patient is awaiting tooth extractions that will likely relieve pain more so than antibiotics.  Recommend he proceed with caution with restarting his warfarin due to bleeding risk with antibiotics.  Patient verbalized understanding  - doxycycline (VIBRA-TABS) 100 MG tablet; Take 1 tablet (100 mg total) by mouth 2 (two) times daily.  Dispense: 14 tablet; Refill: 0 - fluticasone (FLONASE) 50 MCG/ACT nasal spray; Place 2 sprays into both nostrils daily.  Dispense: 16 g; Refill: 6 - traMADol (ULTRAM) 50 MG tablet; Take 1 tablet (50  mg total) by mouth every 8 (eight) hours as needed for up to 5 days.  Dispense: 15 tablet; Refill: 0  2. Dental abscess Patient to proceed with antibiotics and use tramadol as needed for severe pain.  Discussed risk with taking medication with warfarin and patient verbalized understanding.  Will monitor for increased INR and signs of bleeding.  He will proceed with dental extractions as scheduled.  If no improvement in 3 to 5 days, recommend follow-up with PCP for possible CT maxillofacial for possible abscess formation.  - doxycycline (VIBRA-TABS) 100 MG tablet; Take 1 tablet (100 mg total) by mouth 2 (two) times daily.  Dispense: 14 tablet; Refill: 0 - traMADol (ULTRAM) 50 MG tablet; Take 1 tablet (50 mg total) by mouth every 8 (eight) hours as needed for up to 5 days.  Dispense: 15 tablet; Refill: 0   Meds ordered this encounter  Medications   doxycycline  (VIBRA-TABS) 100 MG tablet    Sig: Take 1 tablet (100 mg total) by mouth 2 (two) times daily.    Dispense:  14 tablet    Refill:  0    Supervising Provider:   DE Peru, RAYMOND J [[id number]]   fluticasone (FLONASE) 50 MCG/ACT nasal spray    Sig: Place 2 sprays into both nostrils daily.    Dispense:  16 g    Refill:  6    Supervising Provider:   DE [place], RAYMOND J [[id number]]   traMADol (ULTRAM) 50 MG tablet    Sig: Take 1 tablet (50 mg total) by mouth every 8 (eight) hours as needed for up to 5 days.    Dispense:  15 tablet    Refill:  0    Supervising Provider:   DE [place], RAYMOND J [[id number]]    Return if symptoms worsen or fail to improve.    Patient to reach out to office if new, worrisome, or unresolved symptoms arise or if no improvement in patient's condition. Patient verbalized understanding and is agreeable to treatment plan. All questions answered to patient's satisfaction.    Hilbert Bible, [place]

## 2023-07-08 NOTE — Telephone Encounter (Signed)
 Copied from CRM [id number]. Topic: Clinical - Red Word Triage >> Jul 08, 2023  8:04 AM Izetta Dakin wrote: [facility] that prompted transfer to Nurse Triage: severe pain, has not slept in days  Chief Complaint: sinus pain x 3 weeks Symptoms: pain, fever Frequency: constant Pertinent Negatives: Patient denies chest pain Disposition: [] ED /[] Urgent Care (no appt availability in office) / [x] Appointment(In office/virtual)/ []  [facility]/ [] Home Care/ [] Refused Recommended Disposition /[] [facility]/ []  Follow-up with PCP Additional Notes: Patient reports persistent sinus pain, on second round of antibiotics with no relief. No appts avail with PCP until June. Schedule at [facility] for today at 10:30   Reason for Disposition  [1] SEVERE sinus pain AND [2] not improved 2 hours after pain medicine  Answer Assessment - Initial Assessment Questions 1. ANTIBIOTIC: "What antibiotic are you taking?" "How many times a day?"     Amoxicillin 500mg   2. ONSET: "When was the antibiotic started?"     Started taking 3 days ago  3. PAIN: "How bad is the sinus pain?"   (Scale 1-10; mild, moderate or severe)   - MILD (1-3): doesn't interfere with normal activities    - MODERATE (4-7): interferes with normal activities (e.g., work or school) or awakens from sleep   - SEVERE (8-10): excruciating pain and patient unable to do any normal activities        10/10 pain; hasn't slept in days  4. FEVER: "Do you have a fever?" If Yes, ask: "What is it, how was it measured, and when did it start?"      Unsure of actuual temp but has woken up several day soaked in sweat  5. SYMPTOMS: "Are there any other symptoms you're concerned about?" If Yes, ask: "When did it start?"     Pain and feverish  Protocols used: Sinus Infection on Antibiotic Follow-up Call-[facility]

## 2023-07-17 ENCOUNTER — Telehealth: Payer: Self-pay | Admitting: Physician Assistant

## 2023-07-17 NOTE — Telephone Encounter (Signed)
 Paper Work Dropped Off: Dental pre-op form  Date:07/17/23  Location of paper:  Furniture conservator/restorer

## 2023-07-18 NOTE — Telephone Encounter (Signed)
 Please advise holding coumadin prior to 5 dental extractions.   Thank you!  DW

## 2023-07-18 NOTE — Telephone Encounter (Signed)
 Has not had INR check since 11/22/22.  Unsure if being followed by another practice or has stopped medication.  Please clarify - if seen by another practice, they will need to provide clearance (he will need to be bridged)

## 2023-07-18 NOTE — Telephone Encounter (Signed)
 Patient has a visit with [facility] on 4/7.  DW

## 2023-07-18 NOTE — Telephone Encounter (Signed)
   Pre-operative Risk Assessment    Patient Name: Scott [name]  DOB: [birth year] MRN: [id number]   Date of last office visit: 07/20/22 Jari Favre, [facility] Date of next office visit: 08/20/23 Jacolyn Reedy, [facility]  Request for Surgical Clearance    Procedure:  Dental Extraction - Amount of Teeth to be Pulled:  SURGICAL EXTRACTION OF 5 TEETH  Date of Surgery:  Clearance TBD                                Surgeon:  NOT LISTED Surgeon's Group or Practice Name:  [facility] DENTAL IMPLANT & ORAL AND MAXILLOFACIAL SURGERY Phone number:  [phone] Fax number:  [phone]   Type of Clearance Requested:   - Medical  - Pharmacy:  Hold Warfarin (Coumadin)     REQUEST ALSO ASK FOR RECENT PT/PTT/INR; EKG; PREOP H&P   Type of Anesthesia:  General    Additional requests/questions:    Elpidio Anis   07/18/2023, 12:54 PM

## 2023-07-18 NOTE — Telephone Encounter (Signed)
 Pre-op team,  Patient has a visit with Herma Carson, PA-C on 5/6. If extractions need to occur prior to that, can we please try to move that visit up?   Thank you!  DW

## 2023-07-19 NOTE — Telephone Encounter (Signed)
 Patient with diagnosis of atrial fibrillation and mechanical aortic valve on warfarin for anticoagulation.    Procedure:  Dental Extraction - Amount of Teeth to be Pulled:  SURGICAL EXTRACTION OF 5 TEETH   Date of Surgery:  Clearance TBD    CHA2DS2-VASc Score = 2   This indicates a 2.2% annual risk of stroke. The patient's score is based upon: CHF History: 1 HTN History: 0 Diabetes History: 0 Stroke History: 0 Vascular Disease History: 1 Age Score: 0 Gender Score: 0    CrCl 80 Platelet count 255  Patient does require pre-op antibiotics for dental procedure.  Per office protocol, patient can hold warfarin for 5 days prior to procedure.   Patient will need bridging with Lovenox (enoxaparin) around procedure.  **This guidance is not considered finalized until pre-operative APP has relayed final recommendations.**

## 2023-07-22 ENCOUNTER — Ambulatory Visit: Attending: Internal Medicine | Admitting: *Deleted

## 2023-07-22 DIAGNOSIS — Z952 Presence of prosthetic heart valve: Secondary | ICD-10-CM

## 2023-07-22 DIAGNOSIS — Z5181 Encounter for therapeutic drug level monitoring: Secondary | ICD-10-CM | POA: Diagnosis not present

## 2023-07-22 DIAGNOSIS — I480 Paroxysmal atrial fibrillation: Secondary | ICD-10-CM

## 2023-07-22 LAB — POCT INR: INR: 2.7 (ref 2.0–3.0)

## 2023-07-22 NOTE — Patient Instructions (Signed)
 Continue warfarin 1 tablet daily except 1 1/2 tablets on Mondays, Wednesdays and Fridays Was off warfarin 1 month.  Stopped on his own due to taking pain meds and antibiotics for dental pain Pending 5 dental extractions.  Not scheduled yet.  Will need to hold warfarin and bridge with lovenox. Recheck INR in 4 weeks or before if procedure scheduled before then

## 2023-08-08 NOTE — Progress Notes (Addendum)
 Cardiology Office Note:  .   Date:  08/20/2023  ID:  Scott [name], DOB May 10, 1975, MRN [id number] PCP: Meldon Sport, MD  [facility] Providers Cardiologist:  Ola Berger, MD    History of Present Illness: .   Scott [name] is a 48 y.o. 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 Heywood Louder ([facility]) and Paraschos, cardiac catheterization presurgery in 2020 showed no significant CAD.  Last several OV he's complained of DOE. Exercise and stress echo recommended as mean gradient had increased on echo from March 2024 from to 22 mmHg. Peak grad 43.6. stress echo 10/2022 normal, no ischemia and excellent exercise tolerance.  Patient here for preop clearance for dental extractions May 26.Also had ED visit 06/2023 with chest pain and sinus pressure. Troponins and D-dimer negative. He had leg swelling and hydrochlorothiazide  increased. He was sick for a month and had stopped his coumadin  because he was taking ibuprofen . BNP 121. He was on antibiotics for 2 months. He has DOE going up 13 steps which is unchanged. Not chest pain. Still smoking 1 ppd. Unable to quit.  ROS:    Studies Reviewed: Aaron Aas         Prior CV Studies:  Echo 08/30/23 IMPRESSIONS     1. Left ventricular ejection fraction, by estimation, is 60 to 65%. The  left ventricle has normal function. The left ventricle has no regional  wall motion abnormalities. There is mild left ventricular hypertrophy.  Left ventricular diastolic parameters  were normal.   2. Right ventricular systolic function is normal. The right ventricular  size is normal.   3. The mitral valve is normal in structure. No evidence of mitral valve  regurgitation. No evidence of mitral stenosis.   4. The  aortic valve has been repaired/replaced. Aortic valve  regurgitation is trivial. No aortic stenosis is present. There is a 21 mm  St. Jude mechanical valve present in the aortic position. Procedure Date:  11/03/2018. Aortic valve mean gradient  measures 17.7 mmHg.   5. The inferior vena cava is normal in size with greater than 50%  respiratory variability, suggesting right atrial pressure of 3 mmHg.   Comparison(s): A prior study was performed on 06/19/2022. EF 70-75%. Mild  LVH. Trivial MR. Aortic valve replaced with mechancial 21 mm St. Jude  present in the aortic position. Mean pg 22.0 mmHg. AoVmax 330 cm/s.    Stress echo 10/2022 IMPRESSIONS     1. This is a negative stress echocardiogram for ischemia.   2. This is a low risk study.   FINDINGS   Exam Protocol: The patient exercised on a treadmill according to a Bruce  protocol. Definity  contrast agent was given IV to delineate the left  ventricular endocardial borders.     Patient Performance: The patient exercised for 11 minutes achieving 13.3  METS. The maximum stage achieved was III of the Bruce protocol. The heart  rate at peak stress was 171 bpm. The target heart rate was calculated to  be 148 bpm. The percentage of  maximum predicted heart rate achieved was 98.4 %. The baseline blood  pressure was 138/68 mmHg. The blood pressure at peak stress was 196/59  mmHg. The patient developed no symptoms during the stress exam.    EKG: Resting EKG  showed normal sinus rhythm with right bundle branch block  and left anterior fascicular block. The patient developed no abnormal EKG  findings during exercise.     2D Echo Findings: The baseline ejection fraction was 60%. The peak  ejection fraction at stress was 80%. Baseline regional wall motion  abnormalities were not present. There were no stress-induced wall motion  abnormalities. This is a negative stress  echocardiogram for ischemia.     Luana Rumple MD  Electronically  signed on 11/14/2022 at 5:46:24 PM    Risk Assessment/Calculations:             Physical Exam:   VS:  BP 108/66   Pulse 66   Ht 5\' 11"  (1.803 m)   Wt 167 lb 6.4 oz (75.9 kg)   SpO2 97%   BMI 23.35 kg/m    Wt Readings from Last 3 Encounters:  08/20/23 167 lb 6.4 oz (75.9 kg)  07/08/23 159 lb (72.1 kg)  06/26/23 165 lb (74.8 kg)    GEN: Well nourished, well developed in no acute distress NECK: No JVD; No carotid bruits CARDIAC:  RRR, crisp valve click, 2/6 systolic murmur LSB RESPIRATORY:  Clear to auscultation without rales, wheezing or rhonchi  ABDOMEN: Soft, non-tender, non-distended EXTREMITIES:  No edema; No deformity   ASSESSMENT AND PLAN: .    Preop clearance for 5 dental extraction TIC DENTAL IMPLANT & ORAL AND MAXILLOFACIAL SURGERY on Coumadin  for mechanical heart valve and Afib. he will need to hold coumadin  5 days prior to extraction and have a lovenox  bridge per pharmacy team. He will also need antibiotics before dental work. Had normal stress echo 10/2022 but had ED visit 06/2023 for edema and sinus infection. BNP slightly elevated and edema improved on hydrochlorothiazide  25 mg daily, now back on 12.5 mg daily. No CHF on exam today. Will update echo According to the Revised Cardiac Risk Index (RCRI), his Perioperative Risk of Major Cardiac Event is (%): 0.9  His Functional Capacity in METs is: 7.25 according to the [facility] Activity Status Index (DASI).   Addendum:Echo looks good-see above. Can proceed with Dental surgery. . I don't have the name of the dentist to send it to. He needs lovenox  bridge-already arranged and antibiotics-already sent to his pharmacy. thanks  Congenital heart disease -congenital AS and coarctation of aorta, last surgery AVR 2020 -on coumadin , -stopped for 1 month while on antibiotics and Ibuprofen . Advised him not to  Stop coumadin  and contact us  if he's taking antibiotics or OTC meds so we can adjust.   S/P AVR echo-gradient of aortic valve  increased from 17-22,  with recent edema in March will update echo. -blood pressure difference in his arms: 120/60 (r) and 90/70 (l) this has been the case since surgery in 2020 Check bmet today  Hyperlipidemia -LDL  47, triglycerides 76 04/6107 -continue current medications   Tobacco abuse -cessation advised -too many life stressors to quit.          Dispo: f/u Dr. Avanell Bob 6 months.  Signed, Theotis Flake, PA-C

## 2023-08-19 ENCOUNTER — Ambulatory Visit: Attending: Internal Medicine | Admitting: *Deleted

## 2023-08-19 DIAGNOSIS — Z952 Presence of prosthetic heart valve: Secondary | ICD-10-CM | POA: Diagnosis present

## 2023-08-19 DIAGNOSIS — Z5181 Encounter for therapeutic drug level monitoring: Secondary | ICD-10-CM | POA: Diagnosis present

## 2023-08-19 DIAGNOSIS — I480 Paroxysmal atrial fibrillation: Secondary | ICD-10-CM

## 2023-08-19 LAB — POCT INR: INR: 2.3 (ref 2.0–3.0)

## 2023-08-19 NOTE — Patient Instructions (Signed)
 Continue warfarin 1 tablet daily except 1 1/2 tablets on Mondays, Wednesdays and Fridays Pending 5 dental extractions on 09/09/23.  Will need to hold warfarin and bridge with lovenox . Recheck 09/02/23

## 2023-08-20 ENCOUNTER — Encounter: Payer: Self-pay | Admitting: Physician Assistant

## 2023-08-20 ENCOUNTER — Ambulatory Visit: Attending: Physician Assistant | Admitting: Physician Assistant

## 2023-08-20 VITALS — BP 108/66 | HR 66 | Ht 71.0 in | Wt 167.4 lb

## 2023-08-20 DIAGNOSIS — Q249 Congenital malformation of heart, unspecified: Secondary | ICD-10-CM | POA: Diagnosis not present

## 2023-08-20 DIAGNOSIS — Z720 Tobacco use: Secondary | ICD-10-CM | POA: Diagnosis not present

## 2023-08-20 DIAGNOSIS — Z01818 Encounter for other preprocedural examination: Secondary | ICD-10-CM | POA: Diagnosis not present

## 2023-08-20 DIAGNOSIS — E785 Hyperlipidemia, unspecified: Secondary | ICD-10-CM | POA: Diagnosis not present

## 2023-08-20 DIAGNOSIS — I5032 Chronic diastolic (congestive) heart failure: Secondary | ICD-10-CM | POA: Insufficient documentation

## 2023-08-20 DIAGNOSIS — Z952 Presence of prosthetic heart valve: Secondary | ICD-10-CM | POA: Insufficient documentation

## 2023-08-20 DIAGNOSIS — R609 Edema, unspecified: Secondary | ICD-10-CM | POA: Insufficient documentation

## 2023-08-20 NOTE — Patient Instructions (Addendum)
 Medication Instructions:  Your physician recommends that you continue on your current medications as directed. Please refer to the Current Medication list given to you today.  *If you need a refill on your cardiac medications before your next appointment, please call your pharmacy*  Lab Work: TODAY: BMET If you have labs (blood work) drawn today and your tests are completely normal, you will receive your results only by: MyChart Message (if you have MyChart) OR A paper copy in the mail If you have any lab test that is abnormal or we need to change your treatment, we will call you to review the results.  Testing/Procedures: Your physician has requested that you have an echocardiogram. Echocardiography is a painless test that uses sound waves to create images of your heart. It provides your doctor with information about the size and shape of your heart and how well your heart's chambers and valves are working. This procedure takes approximately one hour. There are no restrictions for this procedure. TO BE DONE AS SOON AS POSSIBLE AT [facility] (Uncertain)   Please do NOT wear cologne, perfume, aftershave, or lotions (deodorant is allowed). Please arrive 15 minutes prior to your appointment time.  Please note: We ask at that you not bring children with you during ultrasound (echo/ vascular) testing. Due to room size and safety concerns, children are not allowed in the ultrasound rooms during exams. Our front office staff cannot provide observation of children in our lobby area while testing is being conducted. An adult accompanying a patient to their appointment will only be allowed in the ultrasound room at the discretion of the ultrasound technician under special circumstances. We apologize for any inconvenience.   Follow-Up: At [facility], you and your health needs are our priority.  As part of our continuing mission to provide you with exceptional heart care, our providers are all  part of one team.  This team includes your primary Cardiologist (physician) and Advanced Practice Providers or APPs (Physician Assistants and Nurse Practitioners) who all work together to provide you with the care you need, when you need it.  Your next appointment:   6 month(s)  Provider:   Ola Berger, MD   We recommend signing up for the patient portal called "MyChart".  Sign up information is provided on this After Visit Summary.  MyChart is used to connect with patients for Virtual Visits (Telemedicine).  Patients are able to view lab/test results, encounter notes, upcoming appointments, etc.  Non-urgent messages can be sent to your provider as well.   To learn more about what you can do with MyChart, go to [email or web address].

## 2023-08-20 NOTE — Telephone Encounter (Signed)
 Patient seen Scott Flake, PA-C in the office 08/20/23. Per Ludwig Safer patient will need an echo done. Our office will try and schedule the echo in the next week to ensure to be cleared cardiac wise for his procedure.

## 2023-08-21 LAB — BASIC METABOLIC PANEL WITH GFR
BUN/Creatinine Ratio: 15 (ref 9–20)
BUN: 14 mg/dL (ref 6–24)
CO2: 23 mmol/L (ref 20–29)
Calcium: 10.2 mg/dL (ref 8.7–10.2)
Chloride: 100 mmol/L (ref 96–106)
Creatinine, Ser: 0.94 mg/dL (ref 0.76–1.27)
Glucose: 100 mg/dL — ABNORMAL HIGH (ref 70–99)
Potassium: 4.4 mmol/L (ref 3.5–5.2)
Sodium: 138 mmol/L (ref 134–144)
eGFR: 101 mL/min/{1.73_m2} (ref >59)

## 2023-08-22 ENCOUNTER — Other Ambulatory Visit: Payer: Self-pay | Admitting: Pharmacist Clinician (PhC)/ Clinical Pharmacy Specialist

## 2023-08-22 MED ORDER — AMOXICILLIN 500 MG PO TABS
ORAL_TABLET | ORAL | 1 refills | Status: DC
Start: 2023-08-22 — End: 2024-02-04

## 2023-08-22 NOTE — Telephone Encounter (Signed)
 Dental prophylaxis abx.  Pt scheduled with [facility] for lovenox  bridging

## 2023-08-30 ENCOUNTER — Ambulatory Visit (HOSPITAL_COMMUNITY)
Admission: RE | Admit: 2023-08-30 | Discharge: 2023-08-30 | Disposition: A | Discharge status: Home / Self Care | Source: Ambulatory Visit | Attending: Physician Assistant | Admitting: Physician Assistant

## 2023-08-30 DIAGNOSIS — R600 Localized edema: Secondary | ICD-10-CM | POA: Diagnosis not present

## 2023-08-30 DIAGNOSIS — I5032 Chronic diastolic (congestive) heart failure: Secondary | ICD-10-CM | POA: Insufficient documentation

## 2023-08-30 DIAGNOSIS — R609 Edema, unspecified: Secondary | ICD-10-CM | POA: Insufficient documentation

## 2023-08-30 DIAGNOSIS — Z01818 Encounter for other preprocedural examination: Secondary | ICD-10-CM | POA: Diagnosis present

## 2023-08-30 LAB — ECHOCARDIOGRAM COMPLETE
AV Mean grad: 17.7 mmHg
AV Peak grad: 36.8 mmHg
Ao pk vel: 3.03 m/s
Area-P 1/2: 2.17 cm2
Calc EF: 61.8 %
S' Lateral: 2.7 cm
Single Plane A2C EF: 60.9 %
Single Plane A4C EF: 62.5 %

## 2023-09-02 ENCOUNTER — Ambulatory Visit: Payer: Self-pay | Admitting: Physician Assistant

## 2023-09-02 ENCOUNTER — Ambulatory Visit: Attending: Internal Medicine | Admitting: *Deleted

## 2023-09-02 DIAGNOSIS — I480 Paroxysmal atrial fibrillation: Secondary | ICD-10-CM | POA: Insufficient documentation

## 2023-09-02 DIAGNOSIS — Z952 Presence of prosthetic heart valve: Secondary | ICD-10-CM | POA: Diagnosis present

## 2023-09-02 DIAGNOSIS — Z5181 Encounter for therapeutic drug level monitoring: Secondary | ICD-10-CM | POA: Insufficient documentation

## 2023-09-02 LAB — POCT INR: INR: 1.6 — AB (ref 2.0–3.0)

## 2023-09-02 NOTE — Telephone Encounter (Signed)
 FYI for sending clearance

## 2023-09-02 NOTE — Patient Instructions (Signed)
 Take warfarin 2 tablets tonight, 1 1/2 tablets tomorrow night then resume 1 tablet daily except 1 1/2 tablets on Mondays, Wednesdays and Fridays Pending 5 dental extractions.  Wait on date.  Had to reschedule.  Will need to hold warfarin and bridge with lovenox . Recheck 09/16/23

## 2023-09-03 ENCOUNTER — Other Ambulatory Visit: Payer: Self-pay | Admitting: Physician Assistant

## 2023-09-04 ENCOUNTER — Ambulatory Visit: Payer: Self-pay | Admitting: Internal Medicine

## 2023-09-10 ENCOUNTER — Other Ambulatory Visit

## 2023-09-11 ENCOUNTER — Encounter: Payer: Self-pay | Admitting: Internal Medicine

## 2023-09-16 ENCOUNTER — Ambulatory Visit: Attending: Internal Medicine

## 2023-09-20 ENCOUNTER — Telehealth: Payer: Self-pay

## 2023-09-20 NOTE — Telephone Encounter (Signed)
 Patient has missed three appointments in rolling 60-month period. Per no show policy, patient can be considered for dismissal.  Alternatively, registrars can contact patient to reschedule missed appointment and communicate NS policy. Please advise.

## 2023-09-26 NOTE — Telephone Encounter (Signed)
 Called patient left voicemail to call to schedule appointment

## 2023-09-27 NOTE — Telephone Encounter (Signed)
 Scheduled

## 2023-10-27 ENCOUNTER — Other Ambulatory Visit: Payer: Self-pay | Admitting: Internal Medicine

## 2023-10-27 DIAGNOSIS — Z952 Presence of prosthetic heart valve: Secondary | ICD-10-CM

## 2023-11-29 ENCOUNTER — Ambulatory Visit: Admitting: Internal Medicine

## 2023-11-29 ENCOUNTER — Encounter: Payer: Self-pay | Admitting: Internal Medicine

## 2023-11-29 VITALS — BP 121/68 | HR 72 | Ht 71.0 in | Wt 165.5 lb

## 2023-11-29 DIAGNOSIS — L84 Corns and callosities: Secondary | ICD-10-CM | POA: Diagnosis not present

## 2023-11-29 DIAGNOSIS — Z8042 Family history of malignant neoplasm of prostate: Secondary | ICD-10-CM | POA: Diagnosis not present

## 2023-11-29 DIAGNOSIS — E559 Vitamin D deficiency, unspecified: Secondary | ICD-10-CM

## 2023-11-29 DIAGNOSIS — Z952 Presence of prosthetic heart valve: Secondary | ICD-10-CM

## 2023-11-29 DIAGNOSIS — Z720 Tobacco use: Secondary | ICD-10-CM | POA: Diagnosis not present

## 2023-11-29 DIAGNOSIS — R3912 Poor urinary stream: Secondary | ICD-10-CM

## 2023-11-29 DIAGNOSIS — I480 Paroxysmal atrial fibrillation: Secondary | ICD-10-CM | POA: Diagnosis not present

## 2023-11-29 DIAGNOSIS — N529 Male erectile dysfunction, unspecified: Secondary | ICD-10-CM | POA: Diagnosis not present

## 2023-11-29 DIAGNOSIS — L720 Epidermal cyst: Secondary | ICD-10-CM | POA: Diagnosis not present

## 2023-11-29 DIAGNOSIS — F331 Major depressive disorder, recurrent, moderate: Secondary | ICD-10-CM | POA: Diagnosis not present

## 2023-11-29 DIAGNOSIS — R7303 Prediabetes: Secondary | ICD-10-CM | POA: Diagnosis not present

## 2023-11-29 DIAGNOSIS — Z1211 Encounter for screening for malignant neoplasm of colon: Secondary | ICD-10-CM

## 2023-11-29 DIAGNOSIS — I5032 Chronic diastolic (congestive) heart failure: Secondary | ICD-10-CM

## 2023-11-29 DIAGNOSIS — E782 Mixed hyperlipidemia: Secondary | ICD-10-CM

## 2023-11-29 MED ORDER — SILDENAFIL CITRATE 50 MG PO TABS: 50.0000 mg | ORAL_TABLET | Freq: Every day | ORAL | 1 refills | Status: AC | PRN

## 2023-11-29 MED ORDER — CARIPRAZINE HCL 1.5 MG PO CAPS: 1.5000 mg | ORAL_CAPSULE | Freq: Every day | ORAL | 3 refills | Status: AC

## 2023-11-29 MED ORDER — MUPIROCIN 2 % EX OINT: 1.0000 | TOPICAL_OINTMENT | Freq: Two times a day (BID) | CUTANEOUS | 0 refills | Status: DC

## 2023-11-29 MED ORDER — ROSUVASTATIN CALCIUM 10 MG PO TABS: 10.0000 mg | ORAL_TABLET | Freq: Every day | ORAL | 3 refills | Status: AC

## 2023-11-29 MED ORDER — TAMSULOSIN HCL 0.4 MG PO CAPS: 0.4000 mg | ORAL_CAPSULE | Freq: Every day | ORAL | 3 refills | Status: AC

## 2023-11-29 NOTE — Assessment & Plan Note (Addendum)
 Uncontrolled Has tried Wellbutrin and other SSRI in the past, but did not like the effects of it He has some component of anxiety and agitation as well Concern for bipolar depression Started regular 1.5 mg QD

## 2023-11-29 NOTE — Assessment & Plan Note (Addendum)
 His symptoms could be due to BPH, although he is considered young for BPH Check PSA, considering his family history of prostate cancer Trial of Flomax  If persistent symptoms, will refer to urology

## 2023-11-29 NOTE — Assessment & Plan Note (Signed)
 Benign cyst over left thigh area Advised to contact if any change in size, shape or color

## 2023-11-29 NOTE — Assessment & Plan Note (Signed)
 Appears euvolemic currently Followed by cardiology On metoprolol  and HCTZ

## 2023-11-29 NOTE — Assessment & Plan Note (Signed)
 Has recurrent blister on right third toe Advised to use toe cushion and avoid using hard toe shoes Mupirocin  ointment for bacterial PPx Referred to podiatry

## 2023-11-29 NOTE — Patient Instructions (Addendum)
 Please start taking Flomax  as prescribed.  Please start using cushion for toe blister.  Please start taking Rosuvastatin  for cholesterol.  Please start taking Vraylar  as prescribed.  Please continue to follow low carb diet and perform moderate exercise/walking at least 150 mins/week.

## 2023-11-29 NOTE — Assessment & Plan Note (Signed)
 Followed by cardiology and anticoagulation clinic On Coumadin

## 2023-11-29 NOTE — Assessment & Plan Note (Signed)
Had atrial fibrillation following AVR/Konno Now rate controlled with metoprolol On Coumadin for [name]

## 2023-11-29 NOTE — Assessment & Plan Note (Signed)
 On Crestor  10 mg QD Needs to be compliant Check lipid profile

## 2023-11-29 NOTE — Assessment & Plan Note (Signed)
 Lab Results  Component Value Date   HGBA1C 5.8 (H) 11/29/2022   Reports recent worsening of fatigue, polydipsia and episodes of dizziness Check CMP and HbA1c

## 2023-11-29 NOTE — Assessment & Plan Note (Signed)
 Smokes about 1-1.5 pack/day  Asked about quitting: confirms that he/she currently smokes cigarettes Advise to quit smoking: Educated about QUITTING to reduce the risk of cancer, cardio and cerebrovascular disease. Assess willingness: Unwilling to quit at this time, but is working on cutting back. Assist with counseling and pharmacotherapy: Counseled for 5 minutes and literature provided. Arrange for follow up: follow up in 3 months and continue to offer help.

## 2023-11-29 NOTE — Progress Notes (Signed)
 Established Patient Office Visit  Subjective:  Patient ID: Scott [name], male    DOB: 10-Jul-1975  Age: 48 y.o. MRN: [id number]  CC:  Chief Complaint  Patient presents with   Medical Management of Chronic Issues    Follow up , wants referral to gastro for colonoscopy.    Foot Pain    Pt reports toe pain on right foot, ongoing for 6 months has concerns.    Cyst    Pt reports knots on left leg ongoing since 2 months.     HPI Scott [name] is a 48 y.o. male with past medical history of coarctation of the aorta (status post repair), left subclavian stenosis, congenital AAS (status post [place] procedure at age 70 and [place] [facility] AVR 10/2018), chest pain, PAF (post AVR surgery, on amiodarone), hydrothorax (postop) and COPD who presents for f/u of his chronic medical conditions.  He is followed by cardiology for PAF and other congenital cardiac conditions.  He is currently on metoprolol  and HCTZ.  He takes Coumadin  for [facility].  Denies any active bleeding currently.  He reports weak urinary stream and postvoid dribbling for the last 1 year. He is concerned as his father and uncle has history of prostate cancer. His PSA was 1.1 in 08/24. He currently denies any dysuria or hematuria.  He denies any urethral discharge currently.  MDD and GAD: He reports chronic anhedonia, fatigue and lack of interest in routine activities.  He also has episodes of anxiety at times.  He used to take Wellbutrin, but did not tolerate it.  He has been prescribed clonazepam by previous provider as well, but was only effective transiently.  Denies SI or HI currently.  He reports noticing 2 masses over left mid thigh area for the last 3 months.  Denies any local soreness, swelling or erythema.  Denies any recent injury.  Right third toe pain: He reports recurrent blister over right third toe, which relates to serous drainage at times.  He has tried using a shoe cushion to prevent friction.  He requests podiatry  referral.     Past Medical History:  Diagnosis Date   Anxiety    Aortic stenosis    Murmur, cardiac    Pancreatitis, acute 01/28/2017    Past Surgical History:  Procedure Laterality Date   AORTIC VALVE REPAIR     when pt was 48 years old   CARDIAC SURGERY     FINGER SURGERY     MECHANICAL AORTIC VALVE REPLACEMENT     pt has card with mechanical valve replacemnt details   RIGHT/LEFT HEART CATH AND CORONARY ANGIOGRAPHY Bilateral 09/17/2018   Procedure: RIGHT/LEFT HEART CATH AND CORONARY ANGIOGRAPHY;  Surgeon: Ammon Blunt, MD;  Location: [facility] INVASIVE CV LAB;  Service: Cardiovascular;  Laterality: Bilateral;    Family History  Problem Relation Age of Onset   Heart disease Father    Heart disease Brother     Social History   Socioeconomic History   Marital status: Married    Spouse name: Not on file   Number of children: 5   Years of education: Not on file   Highest education level: Not on file  Occupational History   Occupation: Scientist, clinical (histocompatibility and immunogenetics)    Comment: Curator, services [employer]  Tobacco Use   Smoking status: Every Day    Current packs/day: 1.50    Average packs/day: 1.5 packs/day for 30.0 years (45.0 ttl pk-yrs)    Types: Cigarettes   Smokeless tobacco: Never  Vaping Use  Vaping status: Never Used  Substance and Sexual Activity   Alcohol use: Not Currently    Comment: occ   Drug use: Yes    Types: Marijuana    Comment: daily use   Sexual activity: Yes  Other Topics Concern   Not on file  Social History Narrative   Not on file   Social Drivers of Health   Financial Resource Strain: Not on file  Food Insecurity: Not on file  Transportation Needs: No Transportation Needs (08/28/2022)   PRAPARE - Administrator, Civil Service (Medical): No    Lack of Transportation (Non-Medical): No  Physical Activity: Not on file  Stress: Stress Concern Present (09/01/2021)   [employer] of Occupational Health - Occupational Stress  Questionnaire    Feeling of Stress : Rather much  Social Connections: Not on file  Intimate Partner Violence: Not on file    Outpatient Medications Prior to Visit  Medication Sig Dispense Refill   acetaminophen  (TYLENOL ) 325 MG tablet Take 650 mg by mouth every 6 (six) hours as needed.     albuterol  (VENTOLIN  HFA) 108 (90 Base) MCG/ACT inhaler INHALE 2 PUFFS INTO THE LUNGS EVERY 4 HOURS AS NEEDED FOR WHEEZING OR SHORTNESS OF BREATH. 18 each 1   amoxicillin  (AMOXIL ) 500 MG tablet Take 4 capsules by mouth 30-60 minutes prior to dental appointment. 4 tablet 1   cetirizine (ZYRTEC) 10 MG tablet Take 10 mg by mouth daily.     fluticasone  (FLONASE ) 50 MCG/ACT nasal spray Place 2 sprays into both nostrils daily. 16 g 6   hydrochlorothiazide  (MICROZIDE ) 12.5 MG capsule TAKE 1 CAPSULE BY MOUTH EVERY DAY 90 capsule 3   ibuprofen  (ADVIL ) 200 MG tablet Take 4 tablets by mouth every 6 (six) hours as needed.     metoprolol  succinate (TOPROL -XL) 25 MG 24 hr tablet TAKE 1 TABLET (25 MG TOTAL) BY MOUTH DAILY. 90 tablet 3   warfarin (COUMADIN ) 10 MG tablet TAKE 1 TABLET DAILY EXCEPT 1&1/2 TABLETS ON MONDAYS, WEDNESDAYS AND FRIDAYS OR AS DIRECTED 45 tablet 2   doxycycline  (VIBRA -TABS) 100 MG tablet Take 1 tablet (100 mg total) by mouth 2 (two) times daily. 14 tablet 0   rosuvastatin  (CRESTOR ) 10 MG tablet Take 1 tablet (10 mg total) by mouth daily. 90 tablet 3   No facility-administered medications prior to visit.    Allergies  Allergen Reactions   Cocoa Hives    Pt sts he is allergic to Coco Puffs    ROS Review of Systems  Constitutional:  Negative for chills and fever.  HENT:  Negative for congestion and sore throat.   Eyes:  Negative for pain and discharge.  Respiratory:  Negative for cough and shortness of breath.   Cardiovascular:  Negative for chest pain and palpitations.  Gastrointestinal:  Negative for diarrhea, nausea and vomiting.  Endocrine: Negative for polydipsia and polyuria.   Genitourinary:  Positive for difficulty urinating. Negative for dysuria, hematuria and penile pain.  Musculoskeletal:  Negative for neck pain and neck stiffness.  Skin:  Negative for rash.  Neurological:  Negative for dizziness, weakness, numbness and headaches.  Psychiatric/Behavioral:  Positive for dysphoric mood and sleep disturbance. Negative for agitation, behavioral problems and suicidal ideas. The patient is nervous/anxious.       Objective:    Physical Exam Vitals reviewed.  Constitutional:      General: He is not in acute distress.    Appearance: He is not diaphoretic.  HENT:     Head:  Normocephalic and atraumatic.     Nose: Nose normal.     Mouth/Throat:     Mouth: Mucous membranes are moist.  Eyes:     General: No scleral icterus.    Extraocular Movements: Extraocular movements intact.  Cardiovascular:     Rate and Rhythm: Normal rate and regular rhythm.     Heart sounds: Normal heart sounds. No murmur heard. Pulmonary:     Breath sounds: Normal breath sounds. No wheezing or rales.  Musculoskeletal:     Cervical back: Neck supple. No tenderness.     Right lower leg: No edema.     Left lower leg: No edema.  Skin:    General: Skin is warm.     Findings: No rash.     Comments: 2 small epidermal cysts over left thigh - about 1 cm in diameter each Blister over right third toe  Neurological:     General: No focal deficit present.     Mental Status: He is alert and oriented to person, place, and time.     Sensory: No sensory deficit.     Motor: No weakness.  Psychiatric:        Mood and Affect: Mood normal.        Behavior: Behavior normal.     BP 121/68   Pulse 72   Ht 5' 11 (1.803 m)   Wt 165 lb 8 oz (75.1 kg)   SpO2 97%   BMI 23.08 kg/m  Wt Readings from Last 3 Encounters:  11/29/23 165 lb 8 oz (75.1 kg)  08/20/23 167 lb 6.4 oz (75.9 kg)  07/08/23 159 lb (72.1 kg)    Lab Results  Component Value Date   TSH 1.10 01/11/2021   Lab Results   Component Value Date   WBC 9.0 06/26/2023   HGB 13.6 06/26/2023   HCT 40.5 06/26/2023   MCV 92.0 06/26/2023   PLT 255 06/26/2023   Lab Results  Component Value Date   NA 138 08/20/2023   K 4.4 08/20/2023   CO2 23 08/20/2023   GLUCOSE 100 (H) 08/20/2023   BUN 14 08/20/2023   CREATININE 0.94 08/20/2023   BILITOT 0.5 06/26/2023   ALKPHOS 68 06/26/2023   AST 20 06/26/2023   ALT 24 06/26/2023   PROT 7.3 06/26/2023   ALBUMIN 4.4 06/26/2023   CALCIUM  10.2 08/20/2023   ANIONGAP 9 06/26/2023   EGFR 101 08/20/2023   Lab Results  Component Value Date   CHOL 121 11/29/2022   Lab Results  Component Value Date   HDL 47 11/29/2022   Lab Results  Component Value Date   LDLCALC 59 11/29/2022   Lab Results  Component Value Date   TRIG 76 11/29/2022   Lab Results  Component Value Date   CHOLHDL 2.6 11/29/2022   Lab Results  Component Value Date   HGBA1C 5.8 (H) 11/29/2022      Assessment & Plan:   Problem List Items Addressed This Visit       Cardiovascular and Mediastinum   CHF (congestive heart failure), NYHA class II, chronic, diastolic (HCC)   Appears euvolemic currently Followed by cardiology On metoprolol  and HCTZ      Relevant Medications   sildenafil  (VIAGRA ) 50 MG tablet   rosuvastatin  (CRESTOR ) 10 MG tablet   Other Relevant Orders   CMP14+EGFR   CBC with Differential/Platelet   PAF (paroxysmal atrial fibrillation) (HCC) - Primary   Had atrial fibrillation following AVR/[place]on[place] Now rate controlled with metoprolol  On Coumadin  for  AC      Relevant Medications   sildenafil  (VIAGRA ) 50 MG tablet   rosuvastatin  (CRESTOR ) 10 MG tablet   Other Relevant Orders   TSH   CMP14+EGFR   CBC with Differential/Platelet     Musculoskeletal and Integument   Epidermal cyst   Benign cyst over left thigh area Advised to contact if any change in size, shape or color      Callus of foot   Has recurrent blister on right third toe Advised to use toe cushion and  avoid using hard toe shoes Mupirocin  ointment for bacterial PPx Referred to podiatry      Relevant Medications   mupirocin  ointment (BACTROBAN ) 2 %   Other Relevant Orders   Ambulatory referral to Podiatry     Other   S/P aortic valve replacement (Chronic)   Followed by cardiology and anticoagulation clinic On Coumadin       Tobacco user   Smokes about 1-1.5 pack/day  Asked about quitting: confirms that he/she currently smokes cigarettes Advise to quit smoking: Educated about QUITTING to reduce the risk of cancer, cardio and cerebrovascular disease. Assess willingness: Unwilling to quit at this time, but is working on cutting back. Assist with counseling and pharmacotherapy: Counseled for 5 minutes and literature provided. Arrange for follow up: follow up in 3 months and continue to offer help.      Mixed hyperlipidemia   On Crestor  10 mg QD Needs to be compliant Check lipid profile      Relevant Medications   sildenafil  (VIAGRA ) 50 MG tablet   rosuvastatin  (CRESTOR ) 10 MG tablet   Other Relevant Orders   Lipid panel   Weak urinary stream   His symptoms could be due to BPH, although he is considered young for BPH Check PSA, considering his family history of prostate cancer Trial of Flomax  If persistent symptoms, will refer to urology      Relevant Medications   tamsulosin  (FLOMAX ) 0.4 MG CAPS capsule   Other Relevant Orders   PSA   Moderate episode of recurrent major depressive disorder (HCC)   Uncontrolled Has tried Wellbutrin and other SSRI in the past, but did not like the effects of it He has some component of anxiety and agitation as well Concern for bipolar depression Started regular 1.5 mg QD      Relevant Medications   cariprazine  (VRAYLAR ) 1.5 MG capsule   Erectile dysfunction   Reports difficulty maintaining erection Fatigue and erectile dysfunction could be effect of beta-blocker Sildenafil  prescribed       Relevant Medications   sildenafil   (VIAGRA ) 50 MG tablet   Prediabetes   Lab Results  Component Value Date   HGBA1C 5.8 (H) 11/29/2022   Reports recent worsening of fatigue, polydipsia and episodes of dizziness Check CMP and HbA1c      Relevant Orders   Hemoglobin A1c   CMP14+EGFR   Other Visit Diagnoses       Family history of prostate cancer       Relevant Orders   PSA     Vitamin D  deficiency       Relevant Orders   VITAMIN D  25 Hydroxy (Vit-D Deficiency, Fractures)     Colon cancer screening       Relevant Orders   Ambulatory referral to Gastroenterology       Meds ordered this encounter  Medications   tamsulosin  (FLOMAX ) 0.4 MG CAPS capsule    Sig: Take 1 capsule (0.4 mg total) by mouth daily.  Dispense:  30 capsule    Refill:  3   sildenafil  (VIAGRA ) 50 MG tablet    Sig: Take 1 tablet (50 mg total) by mouth daily as needed for erectile dysfunction.    Dispense:  10 tablet    Refill:  1   rosuvastatin  (CRESTOR ) 10 MG tablet    Sig: Take 1 tablet (10 mg total) by mouth daily.    Dispense:  90 tablet    Refill:  3   cariprazine  (VRAYLAR ) 1.5 MG capsule    Sig: Take 1 capsule (1.5 mg total) by mouth daily.    Dispense:  30 capsule    Refill:  3   mupirocin  ointment (BACTROBAN ) 2 %    Sig: Apply 1 Application topically 2 (two) times daily.    Dispense:  22 g    Refill:  0    Follow-up: Return in about 4 months (around 03/30/2024).    Suzzane MARLA Blanch, MD

## 2023-11-29 NOTE — Assessment & Plan Note (Signed)
 Reports difficulty maintaining erection Fatigue and erectile dysfunction could be effect of beta-blocker Sildenafil  prescribed

## 2023-12-02 ENCOUNTER — Ambulatory Visit: Payer: Self-pay | Admitting: Internal Medicine

## 2023-12-02 ENCOUNTER — Encounter (INDEPENDENT_AMBULATORY_CARE_PROVIDER_SITE_OTHER): Payer: Self-pay | Admitting: *Deleted

## 2023-12-03 ENCOUNTER — Ambulatory Visit (INDEPENDENT_AMBULATORY_CARE_PROVIDER_SITE_OTHER): Admitting: Podiatry

## 2023-12-03 ENCOUNTER — Ambulatory Visit

## 2023-12-03 ENCOUNTER — Encounter: Payer: Self-pay | Admitting: Podiatry

## 2023-12-03 DIAGNOSIS — M6740 Ganglion, unspecified site: Secondary | ICD-10-CM | POA: Diagnosis not present

## 2023-12-03 DIAGNOSIS — M2041 Other hammer toe(s) (acquired), right foot: Secondary | ICD-10-CM | POA: Diagnosis not present

## 2023-12-03 LAB — CBC WITH DIFFERENTIAL/PLATELET
Basophils Absolute: 0.1 x10E3/uL (ref 0.0–0.2)
Basos: 2 %
EOS (ABSOLUTE): 0.2 x10E3/uL (ref 0.0–0.4)
Eos: 3 %
Hematocrit: 45 % (ref 37.5–51.0)
Hemoglobin: 14.5 g/dL (ref 13.0–17.7)
Immature Grans (Abs): 0 x10E3/uL (ref 0.0–0.1)
Immature Granulocytes: 0 %
Lymphocytes Absolute: 2.2 x10E3/uL (ref 0.7–3.1)
Lymphs: 30 %
MCH: 30.8 pg (ref 26.6–33.0)
MCHC: 32.2 g/dL (ref 31.5–35.7)
MCV: 96 fL (ref 79–97)
Monocytes Absolute: 0.6 x10E3/uL (ref 0.1–0.9)
Monocytes: 9 %
Neutrophils Absolute: 4.2 x10E3/uL (ref 1.4–7.0)
Neutrophils: 56 %
Platelets: 293 x10E3/uL (ref 150–450)
RBC: 4.71 x10E6/uL (ref 4.14–5.80)
RDW: 13.3 % (ref 11.6–15.4)
WBC: 7.4 x10E3/uL (ref 3.4–10.8)

## 2023-12-03 LAB — HEMOGLOBIN A1C
Est. average glucose Bld gHb Est-mCnc: 123 mg/dL
Hgb A1c MFr Bld: 5.9 % — ABNORMAL HIGH (ref 4.8–5.6)

## 2023-12-03 LAB — CMP14+EGFR
ALT: 15 IU/L (ref 0–44)
AST: 19 IU/L (ref 0–40)
Albumin: 4.3 g/dL (ref 4.1–5.1)
Alkaline Phosphatase: 118 IU/L (ref 44–121)
BUN/Creatinine Ratio: 17 (ref 9–20)
BUN: 16 mg/dL (ref 6–24)
Bilirubin Total: 0.2 mg/dL (ref 0.0–1.2)
CO2: 26 mmol/L (ref 20–29)
Calcium: 9.1 mg/dL (ref 8.7–10.2)
Chloride: 104 mmol/L (ref 96–106)
Creatinine, Ser: 0.94 mg/dL (ref 0.76–1.27)
Globulin, Total: 2.3 g/dL (ref 1.5–4.5)
Glucose: 93 mg/dL (ref 70–99)
Potassium: 4.2 mmol/L (ref 3.5–5.2)
Sodium: 139 mmol/L (ref 134–144)
Total Protein: 6.6 g/dL (ref 6.0–8.5)
eGFR: 101 mL/min/1.73 (ref >59)

## 2023-12-03 LAB — TSH: TSH: 2.11 u[IU]/mL (ref 0.450–4.500)

## 2023-12-03 LAB — LIPID PANEL
Chol/HDL Ratio: 4.4 ratio (ref 0.0–5.0)
Cholesterol, Total: 157 mg/dL (ref 100–199)
HDL: 36 mg/dL — ABNORMAL LOW (ref >39)
LDL Chol Calc (NIH): 108 mg/dL — ABNORMAL HIGH (ref 0–99)
Triglycerides: 68 mg/dL (ref 0–149)
VLDL Cholesterol Cal: 13 mg/dL (ref 5–40)

## 2023-12-03 LAB — PSA: Prostate Specific Ag, Serum: 1.6 ng/mL (ref 0.0–4.0)

## 2023-12-03 LAB — VITAMIN D 25 HYDROXY (VIT D DEFICIENCY, FRACTURES): Vit D, 25-Hydroxy: 38.8 ng/mL (ref 30.0–100.0)

## 2023-12-03 NOTE — Progress Notes (Signed)
 Chief Complaint  Patient presents with   Blister    Patient stated that he has had a blister on his right foot 3rd toe for a year now and that it gets big and puss/ liquid comes out. It can be throbbing at times but no medication for pain    HPI: 48 y.o. male presents today with concern of an area that has been draining on the top of his right third toe.  This has been an ongoing issue for the past 6 months.  He saw his PCP on 11/29/2023 and was referred here for care.  He had been prescribed mupirocin  ointment which he was applying to the area.  He states that the area will fill up with fluid and once he drains it a thick, gelatinous type of drainage comes out.  Denies trauma.  Denies any shoes that rub on the area.  States that the area can be painful.  He is not sure what is causing this.  Notes he is not currently employed and is currently working on filing for disability status.  Past Medical History:  Diagnosis Date   Anxiety    Aortic stenosis    Murmur, cardiac    Pancreatitis, acute 01/28/2017   Past Surgical History:  Procedure Laterality Date   AORTIC VALVE REPAIR     when pt was 48 years old   CARDIAC SURGERY     FINGER SURGERY     MECHANICAL AORTIC VALVE REPLACEMENT     pt has card with mechanical valve replacemnt details   RIGHT/LEFT HEART CATH AND CORONARY ANGIOGRAPHY Bilateral 09/17/2018   Procedure: RIGHT/LEFT HEART CATH AND CORONARY ANGIOGRAPHY;  Surgeon: Ammon Blunt, MD;  Location: [facility] INVASIVE CV LAB;  Service: Cardiovascular;  Laterality: Bilateral;   Allergies  Allergen Reactions   Cocoa Hives    Pt sts he is allergic to [employer]     Physical Exam: General: The patient is alert and oriented x3 in no acute distress.  Dermatology: Skin is warm, dry and supple bilateral lower extremities. Interspaces are clear of maceration and debris.  There is evidence of a recent small cyst to the dorsal aspect of the right third toe at the DIPJ.  There is no edema or  erythema.  Minimal pain on palpation noted.  No fluid is able to be expressed today.  Vascular: Palpable pedal pulses bilaterally. Capillary refill within normal limits.  No appreciable edema.  No erythema or calor.  Neurological: Light touch sensation grossly intact bilateral feet.   Musculoskeletal Exam: Minimal contracture of the right third toe noted at the DIPJ.  This is flexible and reducible.  Lab work from 11/29/2023 revealed an A1c of 5.9, white blood cell count within normal limits, normal fasting glucose, normal GFR and creatinine levels and liver enzyme levels.  Radiographic Exam (right foot, 3 weightbearing views, 12/03/2023):  Normal osseous mineralization. Joint spaces preserved.  No fractures noted.  Short second metatarsal.  Mild subchondral sclerosis at the head of the third middle phalanx.  Assessment/Plan of Care: 1. Hammertoe of right foot   2. Mucoid cyst of joint     DG FOOT COMPLETE RIGHT  Discussed clinical findings with patient today.  Informed patient that he has a digital mucoid cyst which are fairly common and have a very high recurrence rate.  They can be painful as they do have an attachment to the joint.  Discussed the etiology and common findings with this type of cyst.  Steroid injections are usually  unsuccessful in resolving this.  Informed patient that he could continue with conservative measures that he has already been doing for the past 6 months by popping and draining the cyst when it gets larger and more painful, and follow-up with mupirocin  ointment that was prescribed by the PCP until the area heals so that it does not get infected.  He did not want to continue with conservative measures and wanted to proceed with surgical correction.  We discussed DIPJ arthroplasty of the right third toe with excision of the digital mucoid cyst.  Informed the patient that, to date, I have had no recurrences of digital mucoid cysts when also addressing of the joint  deformity.  This would be performed under intravenous sedation and local anesthesia in an outpatient surgery center at [facility] surgical specialty center.  He would be in a surgical shoe postop for 2 - 2 1/2 weeks.  Benefits, possible risks, as well as possible postoperative complications were discussed with the patient today.  Also discussed sequela if he did not proceed with any surgical intervention.  Verbal and written surgical consent were obtained preoperatively today.  Reviewed patient's recent lab work from 11/29/2023, indicating it would be safe to proceed with surgery based on the lab values.  Since he is not currently employed, we do not need to work around his work schedule or complete any FMLA paperwork.  He does not have any vacations coming up where he will be out of town.  He states that he is available to have this performed as soon as the surgery schedule opens up.  Awanda CHARM Imperial, DPM, FACFAS [facility]     [address] [place], [place] [address]                Office [phone]  Fax [phone]

## 2023-12-11 ENCOUNTER — Encounter

## 2023-12-12 ENCOUNTER — Ambulatory Visit: Attending: Internal Medicine | Admitting: Pharmacist

## 2023-12-12 ENCOUNTER — Telehealth: Payer: Self-pay | Admitting: Internal Medicine

## 2023-12-12 DIAGNOSIS — Z5181 Encounter for therapeutic drug level monitoring: Secondary | ICD-10-CM | POA: Insufficient documentation

## 2023-12-12 DIAGNOSIS — I480 Paroxysmal atrial fibrillation: Secondary | ICD-10-CM | POA: Diagnosis present

## 2023-12-12 DIAGNOSIS — Z952 Presence of prosthetic heart valve: Secondary | ICD-10-CM | POA: Diagnosis present

## 2023-12-12 LAB — POCT INR: INR: 7.9 — AB (ref 2.0–3.0)

## 2023-12-12 LAB — PROTIME-INR
INR: 6.6 (ref 0.9–1.2)
Prothrombin Time: 70.3 s — ABNORMAL HIGH (ref 9.1–12.0)

## 2023-12-12 NOTE — Progress Notes (Signed)
 INR 6.6 Please see anticoagulation encounter   Description   Elevated INR, sent to lab. Hold dose through Sunday the 31st, restart Monday 9/1. 15mg  Monday, Wednesday, Friday and 10mg  all other days of the week. Recheck in 1 week

## 2023-12-12 NOTE — Patient Instructions (Addendum)
 Description   Elevated INR, sent to lab. Hold dose through Sunday the 31st, restart Monday 9/1. 15mg  Monday, Wednesday, Friday and 10mg  all other days of the week. Recheck in 1 week

## 2023-12-12 NOTE — Telephone Encounter (Signed)
 [facility] calling with critical results. Please advise

## 2023-12-12 NOTE — Telephone Encounter (Signed)
 Spoke to lab [employer] ETTER Carne)   Critical  lab  INR 6.8   Sent anticoagulation pool for management

## 2023-12-23 ENCOUNTER — Telehealth: Payer: Self-pay | Admitting: Podiatry

## 2023-12-23 NOTE — Telephone Encounter (Signed)
 Called pt to get his surgery scheduled and he is on blood thinners. He had his A1C checked recently and it was 7.9 but is to have it rechecked next week. Did you want cardiac clearance and pt to stop blood thinners? Dr Vina Gull is the cardiologist. Surgery is currently scheduled for 01/28/24.   Please advise

## 2023-12-31 ENCOUNTER — Telehealth: Payer: Self-pay | Admitting: Podiatry

## 2023-12-31 NOTE — Telephone Encounter (Signed)
 DOS- 01/28/2024  3RD HAMMERTOE REPAIR LT- [id number]  [employer] MEDICAID EFFECTIVE DATE- 04/17/2023  PER FAX RECEIVED FROM [employer], PRIOR AUTH FOR CPT CODE 71714 HAS BEEN APPROVED FROM 01/28/2024-04/27/2024. AUTH# [id number]

## 2024-01-03 ENCOUNTER — Encounter: Payer: Self-pay | Admitting: Podiatry

## 2024-01-03 ENCOUNTER — Telehealth: Payer: Self-pay

## 2024-01-03 NOTE — Telephone Encounter (Signed)
   Pre-operative Risk Assessment    Patient Name: Rosbel Buckner  DOB: 25-Jun-1975 MRN: [id number]   Date of last office visit: 08/20/23 Date of next office visit: Not scheduled   Request for Surgical Clearance    Procedure:  Foot procedure  Date of Surgery:  Clearance 01/28/24                                Surgeon:  Not provided Surgeon's Group or Practice Name:  [facility] Phone number:  [phone] Fax number:  [phone]   Type of Clearance Requested:   - Medical  - Pharmacy:  Hold Warfarin (Coumadin )     Type of Anesthesia:  Local    Additional requests/questions:    Bonney Ival LOISE Gerome   01/03/2024, 4:23 PM

## 2024-01-03 NOTE — Telephone Encounter (Signed)
 Attempted to contact surgeon's office to receive further clarification on procedure. Office closes at 2:30 pm on Fridays and will need to be contact need week. [id number]

## 2024-01-10 NOTE — Telephone Encounter (Signed)
 2nd attempt to reach the surgeon office to clarify procedure to be done and name of surgeon doing procedure.

## 2024-01-13 NOTE — Telephone Encounter (Signed)
 Patient with diagnosis of atrial fibrillation and mechanical aortic valve on warfarin for anticoagulation.    Procedure: DIPJ, Arthroplasty of right 3rd toe with excision of the digit mucoid cyst  Date of procedure: 01/28/24   CHA2DS2-VASc Score = 2   This indicates a 2.2% annual risk of stroke. The patient's score is based upon: CHF History: 1 HTN History: 0 Diabetes History: 0 Stroke History: 0 Vascular Disease History: 1 Age Score: 0 Gender Score: 0   CrCl 103 Platelet count 293  Patient has not had an Afib/aflutter ablation or Watchman within the last 3 months or DCCV within the last 30 days   Per office protocol, patient can hold warfarin for 5 days prior to procedure.   Patient WILL need bridging with Lovenox  (enoxaparin ) around procedure.  Patient appears to be non-compliant with INR checks, last was 12/12/23 with INR of 6.6.  Will check with [facility] coumadin  clinic to get on schedule.    **This guidance is not considered finalized until pre-operative APP has relayed final recommendations.**

## 2024-01-13 NOTE — Telephone Encounter (Signed)
 Incoming call from surgeon's office to provide clarification. Procedure: DIPJ, Arthroplasty of right 3rd toe with excision of the digit mucoid cyst Surgeon: Dr. McCaughan

## 2024-01-13 NOTE — Telephone Encounter (Signed)
 LMOM for patient to call [facility] office and get Coumadin  appointment scheduled ASAP.

## 2024-01-14 NOTE — Telephone Encounter (Signed)
 Can reach out to the pt to schedule tele preop appt once we know when he is seeing Coumadin  clinic. So that all recommendations can be addressed during tele preop appt.

## 2024-01-14 NOTE — Telephone Encounter (Signed)
 Caller [facility]) returned Pre-op call.

## 2024-01-14 NOTE — Telephone Encounter (Signed)
 He needs a virtual visit as well as follow-up with coumadin  clinic.

## 2024-01-14 NOTE — Telephone Encounter (Signed)
 I will send this to preop APP as well as I do not see if the pt needs an appt for preop clearance. I see a note appt with pharm-d needed.       Gerome Ival SAILOR, CMA to [facility] Preop    01/13/24  1:38 PM Note Incoming call from surgeon's office to provide clarification. Procedure: DIPJ, Arthroplasty of right 3rd toe with excision of the digit mucoid cyst Surgeon: Dr. McCaughan

## 2024-01-15 NOTE — Telephone Encounter (Signed)
 Will forward to pharmd to see if someone can schedule the pt an appt, per notes

## 2024-01-15 NOTE — Telephone Encounter (Signed)
 I sent a staff message to the [facility] scheduling to schedule appt with anticoag clinic.   Teanna Elem M, CMA  P Cv [facility] Scheduling; [place], [place], [facility] Hi everyone,  See notes below please from the Pharm-D. I thought she had sent a message to [facility] office about appt with anticoag clinic.  Once I know when anticoag appt is I can schedule the pt a tele preop appt. Please if someone let me know when the anticoag appt has been made.  Thank you Niels Herschel Allean LITTIE, RPH-CPP    01/13/24  3:09 PM Note LMOM for patient to call [facility] office and get Coumadin  appointment scheduled ASAP.

## 2024-01-15 NOTE — Telephone Encounter (Signed)
 If needed, patient can also have INR checked at [facility] which is where it was done last time. He frequently does not come to appts

## 2024-01-16 NOTE — Telephone Encounter (Signed)
 See notes from Wanatah S, she sent a message to the pt thru MY CHART as she was unable to reach the pt by phone.   Once the pt has an appt with Anticoag clinic, there preop CMA can schedule a tele preop appt to be done after the appt with Anticoag clinic.

## 2024-01-16 NOTE — Telephone Encounter (Signed)
 Slaughter, Vicky T  Mazell Aylesworth, Niels HERO, CMA; [facility] [address] Scheduling; Pavero, Christopher, [facility] 01/16/2024 Called patient-unable to reach-sent a message to patient thru My Chart.

## 2024-01-21 ENCOUNTER — Telehealth (HOSPITAL_BASED_OUTPATIENT_CLINIC_OR_DEPARTMENT_OTHER): Payer: Self-pay | Admitting: *Deleted

## 2024-01-21 ENCOUNTER — Encounter

## 2024-01-21 NOTE — Telephone Encounter (Signed)
 Pt has been scheduled tele preop appt 01/24/24. Med rec and consent are done.

## 2024-01-21 NOTE — Telephone Encounter (Signed)
 Pt has been scheduled tele preop appt 01/24/24. Med rec and consent are done.      Patient Consent for Virtual Visit        Scott [name] has provided verbal consent on 01/21/2024 for a virtual visit (video or telephone).   CONSENT FOR VIRTUAL VISIT FOR:  Scott [name]  By participating in this virtual visit I agree to the following:  I hereby voluntarily request, consent and authorize [facility] and its employed or contracted physicians, physician assistants, nurse practitioners or other licensed health care professionals (the Practitioner), to provide me with telemedicine health care services (the "Services) as deemed necessary by the treating Practitioner. I acknowledge and consent to receive the Services by the Practitioner via telemedicine. I understand that the telemedicine visit will involve communicating with the Practitioner through live audiovisual communication technology and the disclosure of certain medical information by electronic transmission. I acknowledge that I have been given the opportunity to request an in-person assessment or other available alternative prior to the telemedicine visit and am voluntarily participating in the telemedicine visit.  I understand that I have the right to withhold or withdraw my consent to the use of telemedicine in the course of my care at any time, without affecting my right to future care or treatment, and that the Practitioner or I may terminate the telemedicine visit at any time. I understand that I have the right to inspect all information obtained and/or recorded in the course of the telemedicine visit and may receive copies of available information for a reasonable fee.  I understand that some of the potential risks of receiving the Services via telemedicine include:  Delay or interruption in medical evaluation due to technological equipment failure or disruption; Information transmitted may not be sufficient (e.g. poor  resolution of images) to allow for appropriate medical decision making by the Practitioner; and/or  In rare instances, security protocols could fail, causing a breach of personal health information.  Furthermore, I acknowledge that it is my responsibility to provide information about my medical history, conditions and care that is complete and accurate to the best of my ability. I acknowledge that Practitioner's advice, recommendations, and/or decision may be based on factors not within their control, such as incomplete or inaccurate data provided by me or distortions of diagnostic images or specimens that may result from electronic transmissions. I understand that the practice of medicine is not an exact science and that Practitioner makes no warranties or guarantees regarding treatment outcomes. I acknowledge that a copy of this consent can be made available to me via my patient portal [facility] MyChart), or I can request a printed copy by calling the office of [facility].    I understand that my insurance will be billed for this visit.   I have read or had this consent read to me. I understand the contents of this consent, which adequately explains the benefits and risks of the Services being provided via telemedicine.  I have been provided ample opportunity to ask questions regarding this consent and the Services and have had my questions answered to my satisfaction. I give my informed consent for the services to be provided through the use of telemedicine in my medical care

## 2024-01-21 NOTE — Telephone Encounter (Signed)
 Clarification on procedure: 3RD HAMMERTOE REPAIR LT

## 2024-01-22 ENCOUNTER — Ambulatory Visit: Attending: Internal Medicine | Admitting: *Deleted

## 2024-01-22 DIAGNOSIS — Z952 Presence of prosthetic heart valve: Secondary | ICD-10-CM

## 2024-01-22 DIAGNOSIS — Z5181 Encounter for therapeutic drug level monitoring: Secondary | ICD-10-CM

## 2024-01-22 DIAGNOSIS — I480 Paroxysmal atrial fibrillation: Secondary | ICD-10-CM

## 2024-01-22 NOTE — Progress Notes (Signed)
 INR 7.9; Please see anticoagulation encounter

## 2024-01-22 NOTE — Patient Instructions (Signed)
 Pending Hammer Toe surgery on 10/14.  Will hold warfarin and bridge with Lovenox .  INR 7.9 today.  Discussed with C Pavero PharmD and CHRISTELLA Crews PharmD.  Agreed to hold warfarin and recheck INR on Monday morning (10/13).  Will reevaluate lovenox  bridge at that time.  If INR to low may need Lovenox  that morning.  Will restart warfarin and Lovenox  after procedure per protocol. Pt denies S/S of bleeding or excessive bruising.  Bleeding and fall precautions discussed with pt and he verbalized understanding.

## 2024-01-23 NOTE — Progress Notes (Unsigned)
 Virtual Visit via Telephone Note   Because of Bookert Guzzi co-morbid illnesses, he is at least at moderate risk for complications without adequate follow up.  This format is felt to be most appropriate for this patient at this time.  Due to technical limitations with video connection (technology), today's appointment will be conducted as an audio only telehealth visit, and Scott [name] verbally agreed to proceed in this manner.   All issues noted in this document were discussed and addressed.  No physical exam could be performed with this format.  Evaluation Performed:  Preoperative cardiovascular risk assessment _____________   Date:  01/23/2024   Patient ID:  Scott [name], DOB 1976/03/14, MRN [id number] Patient Location:  Home Provider location:   Office  Primary Care Provider:  Tobie Suzzane POUR, MD Primary Cardiologist:  Vina Gull, MD  Chief Complaint / Patient Profile   48 y.o. y/o male with a h/o CHF, paroxysmal atrial fibrillation, RBBB who is pending foot surgery and presents today for telephonic preoperative cardiovascular risk assessment.  History of Present Illness    Scott [name] is a 48 y.o. male who presents via audio/video conferencing for a telehealth visit today.  Pt was last seen in cardiology clinic on 08/20/2023 by Olivia Pavy, PA-C who.  At that time Scott [name] was doing well .  The patient is now pending procedure as outlined above. Since his last visit, he remains stable from a cardiac standpoint.  Today he denies chest pain, shortness of breath, lower extremity edema, fatigue, palpitations, melena, hematuria, hemoptysis, diaphoresis, weakness, presyncope, syncope, orthopnea, and PND.   Past Medical History    Past Medical History:  Diagnosis Date   Anxiety    Aortic stenosis    Murmur, cardiac    Pancreatitis, acute 01/28/2017   Past Surgical History:  Procedure Laterality Date   AORTIC VALVE REPAIR     when pt was 48 years old    CARDIAC SURGERY     FINGER SURGERY     MECHANICAL AORTIC VALVE REPLACEMENT     pt has card with mechanical valve replacemnt details   RIGHT/LEFT HEART CATH AND CORONARY ANGIOGRAPHY Bilateral 09/17/2018   Procedure: RIGHT/LEFT HEART CATH AND CORONARY ANGIOGRAPHY;  Surgeon: Ammon Blunt, MD;  Location: [facility] INVASIVE CV LAB;  Service: Cardiovascular;  Laterality: Bilateral;    Allergies  Allergies  Allergen Reactions   Cocoa Hives    Pt sts he is allergic to Coco Puffs    Home Medications    Prior to Admission medications   Medication Sig Start Date End Date Taking? Authorizing Provider  acetaminophen  (TYLENOL ) 325 MG tablet Take 650 mg by mouth every 6 (six) hours as needed.    [provider]  albuterol  (VENTOLIN  HFA) 108 (90 Base) MCG/ACT inhaler INHALE 2 PUFFS INTO THE LUNGS EVERY 4 HOURS AS NEEDED FOR WHEEZING OR SHORTNESS OF BREATH. 01/07/23   Tobie Suzzane POUR, MD  amoxicillin  (AMOXIL ) 500 MG tablet Take 4 capsules by mouth 30-60 minutes prior to dental appointment. 08/22/23   Gull Vina GAILS, MD  cariprazine  (VRAYLAR ) 1.5 MG capsule Take 1 capsule (1.5 mg total) by mouth daily. 11/29/23   Tobie Suzzane POUR, MD  cetirizine (ZYRTEC) 10 MG tablet Take 10 mg by mouth daily.    [provider]  fluticasone  (FLONASE ) 50 MCG/ACT nasal spray Place 2 sprays into both nostrils daily. 07/08/23   Knute Thersia Bitters, FNP  hydrochlorothiazide  (MICROZIDE ) 12.5 MG capsule TAKE 1 CAPSULE BY MOUTH EVERY DAY 09/04/23  Okey Vina GAILS, MD  ibuprofen  (ADVIL ) 200 MG tablet Take 4 tablets by mouth every 6 (six) hours as needed.    [provider]  metoprolol  succinate (TOPROL -XL) 25 MG 24 hr tablet TAKE 1 TABLET (25 MG TOTAL) BY MOUTH DAILY. 12/31/22   Conte, Tessa N, PA-C  mupirocin  ointment (BACTROBAN ) 2 % Apply 1 Application topically 2 (two) times daily. 11/29/23   Tobie Suzzane POUR, MD  rosuvastatin  (CRESTOR ) 10 MG tablet Take 1 tablet (10 mg total) by mouth daily. 11/29/23    Tobie Suzzane POUR, MD  sildenafil  (VIAGRA ) 50 MG tablet Take 1 tablet (50 mg total) by mouth daily as needed for erectile dysfunction. 11/29/23   Tobie Suzzane POUR, MD  tamsulosin  (FLOMAX ) 0.4 MG CAPS capsule Take 1 capsule (0.4 mg total) by mouth daily. 11/29/23   Tobie Suzzane POUR, MD  warfarin (COUMADIN ) 10 MG tablet TAKE 1 TABLET DAILY EXCEPT 1&1/2 TABLETS ON MONDAYS, [facility] AND [facility] OR AS DIRECTED 10/28/23   Okey Vina GAILS, MD    Physical Exam    Vital Signs:  Scott [name] does not have vital signs available for review today.  Given telephonic nature of communication, physical exam is limited. AAOx3. NAD. Normal affect.  Speech and respirations are unlabored.  Accessory Clinical Findings    None  Assessment & Plan    1.  Preoperative Cardiovascular Risk Assessment: Foot procedure   Date of Surgery:  Clearance 01/28/24                                  Surgeon:  Not provided Surgeon's Group or Practice Name:  [facility] Phone number:  [phone] Fax number:  [phone]      Primary Cardiologist: Vina Okey, MD  Chart reviewed as part of pre-operative protocol coverage. Given past medical history and time since last visit, based on ACC/AHA guidelines, Scott [name] would be at acceptable risk for the planned procedure without further cardiovascular testing.   His RCRI is low risk, 0.9% risk of major cardiac event.  He is able to complete greater than 4 METS of physical activity.  Patient was advised that if he develops new symptoms prior to surgery to contact our office to arrange a follow-up appointment.  He verbalized understanding.  Per office protocol, patient can hold warfarin for 5 days prior to procedure.   Patient WILL need bridging with Lovenox  (enoxaparin ) around procedure.  I will route this recommendation to the requesting party via Epic fax function and remove from pre-op pool.       Time:   Today, I have spent  5  minutes with the patient with telehealth technology discussing medical history, symptoms, and management plan.  I spent 10 minutes reviewing patient's past cardiac history and cardiac medications.    Josefa CHRISTELLA Beauvais, NP  01/23/2024, 4:10 PM

## 2024-01-24 ENCOUNTER — Ambulatory Visit

## 2024-01-24 DIAGNOSIS — Z01810 Encounter for preprocedural cardiovascular examination: Secondary | ICD-10-CM | POA: Diagnosis not present

## 2024-01-24 NOTE — Telephone Encounter (Signed)
 Received note from cardiology in the folder for Dr Awanda to review.

## 2024-01-27 ENCOUNTER — Ambulatory Visit: Attending: Internal Medicine | Admitting: *Deleted

## 2024-01-27 ENCOUNTER — Other Ambulatory Visit: Payer: Self-pay | Admitting: Podiatry

## 2024-01-27 DIAGNOSIS — Z5181 Encounter for therapeutic drug level monitoring: Secondary | ICD-10-CM | POA: Diagnosis present

## 2024-01-27 DIAGNOSIS — I480 Paroxysmal atrial fibrillation: Secondary | ICD-10-CM | POA: Diagnosis present

## 2024-01-27 DIAGNOSIS — Z952 Presence of prosthetic heart valve: Secondary | ICD-10-CM | POA: Insufficient documentation

## 2024-01-27 LAB — POCT INR: INR: 1 — AB (ref 2.0–3.0)

## 2024-01-27 MED ORDER — AMOXICILLIN 500 MG PO CAPS: 500.0000 mg | ORAL_CAPSULE | Freq: Two times a day (BID) | ORAL | 0 refills | Status: AC

## 2024-01-27 MED ORDER — ENOXAPARIN SODIUM 80 MG/0.8ML IJ SOSY: 80.0000 mg | PREFILLED_SYRINGE | Freq: Two times a day (BID) | INTRAMUSCULAR | 0 refills | Status: DC

## 2024-01-27 MED ORDER — HYDROCODONE-ACETAMINOPHEN 5-325 MG PO TABS: ORAL_TABLET | ORAL | 0 refills | Status: DC

## 2024-01-27 NOTE — Telephone Encounter (Signed)
 S/w Maryssa from [facility] who inquired if our practice was going to be the ones handling the Lovenox . I read the notes from Olam HAMS RN with our Anticoagulation Clinic: Pending Hammer Toe surgery on 10/14.  Warfarin has been on hold since 10/8 due to INR of 7.9.   INR today 1.0 (10/13).  Gave Lovenox  80mg  sq LLQ of abdomen in office by LITTIE Bathe RN.  Will restart warfarin and Lovenox  after procedure per protocol.  See pt instructions.    I stated I will fax these notes over to their office as well for documentation.Adela thanked me for my help.

## 2024-01-27 NOTE — Progress Notes (Signed)
 INR 1.0; Please see anticoagulation encounter

## 2024-01-27 NOTE — Progress Notes (Signed)
 Patient's postoperative medications were sent to the pharmacy on file today for tomorrow surgery.

## 2024-01-27 NOTE — Telephone Encounter (Signed)
 Scott [name] from [facility] requesting a c/b in regards to medical clearance

## 2024-01-27 NOTE — Patient Instructions (Addendum)
 01/28/24  Hammer toe surgery  Labs:  Hgb 14.5  Hct 45.0  Plts 293  CrCl 103.20  Scr 0.94  Wt 75.1kg  10/13  Lovenox  80mg  sq given in office this morning by L Amethyst Gainer RN-------No Lovenox  this evening. 10/14  No Lovenox  in am ---------surgery -------warfarin 15mg  pm 10/15  Lovenox  80mg  sq at 7am and 7pm and warfarin 20mg  in pm 10/16  Lovenox  80mg  sq at 7am and 7pm and warfarin 10mg  in pm 10/17  Lovenox  80mg  sq at 7am and 7pm and warfarin 15mg  in pm 10/18 - 10/19  Lovenox  80mg  sq at 7am and 7pm and warfarin 10mg  in pm 10/20  Lovenox  80mg  sq at 7am and INR appt at 8:15am

## 2024-01-28 DIAGNOSIS — L858 Other specified epidermal thickening: Secondary | ICD-10-CM | POA: Diagnosis not present

## 2024-01-28 DIAGNOSIS — M2041 Other hammer toe(s) (acquired), right foot: Secondary | ICD-10-CM | POA: Diagnosis not present

## 2024-01-28 DIAGNOSIS — M71371 Other bursal cyst, right ankle and foot: Secondary | ICD-10-CM | POA: Diagnosis not present

## 2024-01-28 DIAGNOSIS — M67471 Ganglion, right ankle and foot: Secondary | ICD-10-CM | POA: Diagnosis not present

## 2024-01-28 DIAGNOSIS — M2042 Other hammer toe(s) (acquired), left foot: Secondary | ICD-10-CM | POA: Diagnosis not present

## 2024-02-03 ENCOUNTER — Ambulatory Visit: Attending: Internal Medicine | Admitting: *Deleted

## 2024-02-03 DIAGNOSIS — I480 Paroxysmal atrial fibrillation: Secondary | ICD-10-CM | POA: Insufficient documentation

## 2024-02-03 DIAGNOSIS — Z952 Presence of prosthetic heart valve: Secondary | ICD-10-CM | POA: Insufficient documentation

## 2024-02-03 DIAGNOSIS — Z5181 Encounter for therapeutic drug level monitoring: Secondary | ICD-10-CM | POA: Diagnosis present

## 2024-02-03 LAB — POCT INR: INR: 5.2 — AB (ref 2.0–3.0)

## 2024-02-03 NOTE — Patient Instructions (Signed)
 Hammer Toe surgery on 10/14.  D/C on amoxicillin  x 7 days Hold warfarin tonight, take 1/2 tablet tomorrow night then restart warfarin 1 tablet daily except 1 1/2 tablets on Mondays Stop Lovenox  Recheck in 1 week.

## 2024-02-03 NOTE — Progress Notes (Signed)
 INR 5.2; Please see anticoagulation encounter

## 2024-02-04 ENCOUNTER — Ambulatory Visit (INDEPENDENT_AMBULATORY_CARE_PROVIDER_SITE_OTHER): Admitting: Podiatry

## 2024-02-04 VITALS — BP 125/62 | HR 70 | Temp 96.8°F

## 2024-02-04 DIAGNOSIS — Z9889 Other specified postprocedural states: Secondary | ICD-10-CM

## 2024-02-04 DIAGNOSIS — M2041 Other hammer toe(s) (acquired), right foot: Secondary | ICD-10-CM

## 2024-02-04 NOTE — Patient Instructions (Signed)

## 2024-02-04 NOTE — Progress Notes (Signed)
 Patient presents for post-op visit today, POV # 1 DOS 01/28/24 -RT 3RD TOE ARTHROPLASTY AT DISTAL JOINT( dipj) INVOLVING CUTTING AND REMOVAL OF MUCOID CYST AND BONE TO PREVENT RECURRANCE  I did get it wet the third day and had to change dressing. Doing good. A little tender..  RN Notes: n/a  Vital Signs: Today's Vitals   02/04/24 0953  BP: 125/62  Pulse: 70  Temp: (!) 96.8 F (36 C)  TempSrc: Oral  PainSc: 3   PainLoc: Toe      Radiographs: []  Taken [x]  Not taken  Surgical Site Assessment:  - Dressing:  [x]  Minimal dry blood, intact []  Reinforced   []  Changed     -RN Notes: yellowish drainage present on dressing.  - Incision:  [x]  CDI (clean, dry, intact)  [x]  Mild erythema  []  Drainage noted   -RN Notes: no active drainage from incision  - Swelling:  []  None  [x]  Mild  []  Moderate   []  Significant     -RN Notes: n/a  - Bruising:  [x]  None  []  Present: n/a   - Sutures/Staples:  []  None [x]  Intact  []  Removed Today  [x]  Plan to remove at next visit   -Cast/Splint/Pins: [x]  None []  Intact []  Removed Today []  Plan to remove at next visit []  Replaced  -Signs of infection:  []  None  [x]  Present - Describe: yellowish drainage present on dressing.  -DME:    []  None []  AFW [x]  Surgical shoe []  Cast  []  Splint  -Walking status:  [x]  Full WB  []  Partial WB  []  NWB  -Utilizing device:  [x]  None []  Knee Scooter []  Crutches []  Wheelchair    DVT assessment:  [x]  Denies symptoms []  Chest pain/SOB []  Pain in calf/redness/warmth   Redressed DSD and ace wrap. Educated on signs of infection, proper dressing care, pain management, and weight bearing status. Patient will contact provider with any new or worsening symptoms. The provider assessed the patient today and reviewed instructions regarding plan of care.

## 2024-02-05 ENCOUNTER — Encounter: Payer: Self-pay | Admitting: Podiatry

## 2024-02-06 ENCOUNTER — Ambulatory Visit: Payer: Self-pay | Admitting: Podiatry

## 2024-02-10 ENCOUNTER — Ambulatory Visit: Attending: Internal Medicine | Admitting: *Deleted

## 2024-02-10 DIAGNOSIS — I480 Paroxysmal atrial fibrillation: Secondary | ICD-10-CM | POA: Insufficient documentation

## 2024-02-10 DIAGNOSIS — Z5181 Encounter for therapeutic drug level monitoring: Secondary | ICD-10-CM | POA: Diagnosis present

## 2024-02-10 DIAGNOSIS — Z952 Presence of prosthetic heart valve: Secondary | ICD-10-CM | POA: Insufficient documentation

## 2024-02-10 LAB — POCT INR: INR: 3.7 — AB (ref 2.0–3.0)

## 2024-02-10 NOTE — Patient Instructions (Signed)
 Hammer Toe surgery on 10/14.   Hold warfarin tonight then decrease dose to 1 tablet daily Recheck in 3 weeks

## 2024-02-10 NOTE — Progress Notes (Signed)
 INR 3.7 Please see anticoagulation encounter

## 2024-02-11 ENCOUNTER — Ambulatory Visit: Admitting: Podiatry

## 2024-02-11 DIAGNOSIS — Z9889 Other specified postprocedural states: Secondary | ICD-10-CM

## 2024-02-11 DIAGNOSIS — M2041 Other hammer toe(s) (acquired), right foot: Secondary | ICD-10-CM

## 2024-02-11 DIAGNOSIS — M6740 Ganglion, unspecified site: Secondary | ICD-10-CM

## 2024-02-11 MED ORDER — HYDROCODONE-ACETAMINOPHEN 5-325 MG PO TABS: ORAL_TABLET | ORAL | 0 refills | Status: AC

## 2024-02-11 NOTE — Progress Notes (Unsigned)
 Patient presents for post-op visit today, POV # 2 DOS 01/28/24 -RT 3RD TOE ARTHROPLASTY AT DISTAL JOINT( dipj) INVOLVING CUTTING AND REMOVAL OF MUCOID CYST AND BONE TO PREVENT RECURRANCE  More sore now than before. Feels more like in the bottom of the toe..  RN Notes: n/a  Vital Signs: There were no vitals filed for this visit.    Radiographs: []  Taken [x]  Not taken  Surgical Site Assessment:  - Dressing:  [x]  Minimal dry blood, intact []  Reinforced   []  Changed     -RN Notes: Patient has changed dressing a few times due to it coming loose.   - Incision:  [x]  CDI (clean, dry, intact)  [x]  Mild erythema  []  Drainage noted   -RN Notes: n/a  - Swelling:  []  None  [x]  Mild  []  Moderate   []  Significant     -RN Notes: n/a  - Bruising:  [x]  None  []  Present: n/a   - Sutures/Staples:  []  None [x]  Intact  [x]  Removed Today  []  Plan to remove at next visit   -Cast/Splint/Pins: [x]  None []  Intact []  Removed Today []  Plan to remove at next visit []  Replaced  -Signs of infection:  [x]  None  []  Present - Describe: n/a  -DME:    []  None []  AFW [x]  Surgical shoe []  Cast  []  Splint  -Walking status:  [x]  Full WB  []  Partial WB  []  NWB  -Utilizing device:  [x]  None []  Knee Scooter []  Crutches []  Wheelchair    DVT assessment:  [x]  Denies symptoms []  Chest pain/SOB []  Pain in calf/redness/warmth   Redressed DSD and ace wrap. Educated on signs of infection, proper dressing care, pain management, and weight bearing status. Patient will contact provider with any new or worsening symptoms. The provider assessed the patient today and reviewed instructions regarding plan of care.

## 2024-02-18 ENCOUNTER — Encounter: Admitting: Podiatry

## 2024-02-25 ENCOUNTER — Encounter: Admitting: Podiatry

## 2024-02-25 NOTE — Progress Notes (Signed)
 Patient did not show for his scheduled postoperative appointment today.

## 2024-03-02 ENCOUNTER — Ambulatory Visit: Attending: Internal Medicine

## 2024-03-20 ENCOUNTER — Other Ambulatory Visit: Payer: Self-pay | Admitting: Internal Medicine

## 2024-03-20 DIAGNOSIS — Z952 Presence of prosthetic heart valve: Secondary | ICD-10-CM

## 2024-03-20 NOTE — Telephone Encounter (Addendum)
 Warfarin 10mg  refill Dx Afib Last INR-02/10/24 and was due 03/02/24-OVERDUE & needs an appt Last OV 08/20/23 and due in 6 months-needs an OV appt  Called patient and had to leave a message to call back for an appointment.

## 2024-03-23 NOTE — Telephone Encounter (Addendum)
 Called pt to scheduled INR appt and MD appt.  No Answer.  Left message for pt to call office.  4:19pm  No answer.  03/24/24  No answer/LR

## 2024-03-25 NOTE — Telephone Encounter (Signed)
 Called patient since he needs an Anticoagulation Appt and had to leave a voicemail; advised on the message to call back to schedule today since we have been trying to reach him or we will have to refuse the refill as this is a monitored medication. Will await and follow up

## 2024-04-01 ENCOUNTER — Ambulatory Visit: Admitting: Internal Medicine

## 2024-04-20 ENCOUNTER — Telehealth: Payer: Self-pay | Admitting: Internal Medicine

## 2024-04-20 ENCOUNTER — Encounter: Payer: Self-pay | Admitting: Internal Medicine

## 2024-04-20 NOTE — Telephone Encounter (Signed)
 Error

## 2024-04-20 NOTE — Telephone Encounter (Signed)
" °*  STAT* If patient is at the pharmacy, call can be transferred to refill team.   1. Which medications need to be refilled? (please list name of each medication and dose if known) warfarin (COUMADIN ) 10 MG tablet  2. Which pharmacy/location (including street and city if local pharmacy) is medication to be sent to?  [id number] - [place], [place] - [address] AT [address]  3. Do they need a 30 day or 90 day supply? Wife says patient is completely out of medication.  "

## 2024-04-21 ENCOUNTER — Ambulatory Visit: Attending: Internal Medicine | Admitting: *Deleted

## 2024-04-21 DIAGNOSIS — Z5181 Encounter for therapeutic drug level monitoring: Secondary | ICD-10-CM | POA: Insufficient documentation

## 2024-04-21 DIAGNOSIS — Z952 Presence of prosthetic heart valve: Secondary | ICD-10-CM | POA: Diagnosis present

## 2024-04-21 DIAGNOSIS — I480 Paroxysmal atrial fibrillation: Secondary | ICD-10-CM | POA: Insufficient documentation

## 2024-04-21 LAB — POCT INR: INR: 1 — AB (ref 2.0–3.0)

## 2024-04-21 MED ORDER — WARFARIN SODIUM 10 MG PO TABS: ORAL_TABLET | ORAL | 1 refills | Status: AC

## 2024-04-21 NOTE — Progress Notes (Signed)
 INR 1.0; Please see anticoagulation encounter

## 2024-04-21 NOTE — Patient Instructions (Signed)
 Been out of warfarin 2 weeks Restart warfarin 1 tablet daily Recheck in 2 weeks

## 2024-04-22 NOTE — Telephone Encounter (Signed)
Rx sent earlier today.

## 2024-05-05 ENCOUNTER — Ambulatory Visit

## 2024-05-18 ENCOUNTER — Ambulatory Visit

## 2024-05-19 ENCOUNTER — Ambulatory Visit
